# Patient Record
Sex: Male | Born: 1955 | Race: White | Hispanic: Yes | Marital: Single | Smoking: Never smoker
Health system: Southern US, Community
[De-identification: ages and names within clinical notes are randomized; demographics above are authoritative.]

## PROBLEM LIST (undated history)

## (undated) DIAGNOSIS — Z923 Personal history of irradiation: Secondary | ICD-10-CM

## (undated) DIAGNOSIS — I4891 Unspecified atrial fibrillation: Secondary | ICD-10-CM

## (undated) DIAGNOSIS — F1010 Alcohol abuse, uncomplicated: Secondary | ICD-10-CM

## (undated) DIAGNOSIS — F319 Bipolar disorder, unspecified: Secondary | ICD-10-CM

## (undated) DIAGNOSIS — A529 Late syphilis, unspecified: Secondary | ICD-10-CM

## (undated) DIAGNOSIS — G459 Transient cerebral ischemic attack, unspecified: Secondary | ICD-10-CM

## (undated) DIAGNOSIS — Z21 Asymptomatic human immunodeficiency virus [HIV] infection status: Secondary | ICD-10-CM

## (undated) DIAGNOSIS — N179 Acute kidney failure, unspecified: Secondary | ICD-10-CM

## (undated) DIAGNOSIS — E872 Acidosis: Secondary | ICD-10-CM

## (undated) DIAGNOSIS — M545 Low back pain: Secondary | ICD-10-CM

## (undated) DIAGNOSIS — F329 Major depressive disorder, single episode, unspecified: Secondary | ICD-10-CM

## (undated) DIAGNOSIS — Z9289 Personal history of other medical treatment: Secondary | ICD-10-CM

## (undated) DIAGNOSIS — F332 Major depressive disorder, recurrent severe without psychotic features: Secondary | ICD-10-CM

## (undated) DIAGNOSIS — I10 Essential (primary) hypertension: Secondary | ICD-10-CM

## (undated) DIAGNOSIS — I639 Cerebral infarction, unspecified: Secondary | ICD-10-CM

## (undated) DIAGNOSIS — I951 Orthostatic hypotension: Secondary | ICD-10-CM

## (undated) DIAGNOSIS — B20 Human immunodeficiency virus [HIV] disease: Secondary | ICD-10-CM

## (undated) DIAGNOSIS — K912 Postsurgical malabsorption, not elsewhere classified: Secondary | ICD-10-CM

## (undated) DIAGNOSIS — E785 Hyperlipidemia, unspecified: Secondary | ICD-10-CM

## (undated) DIAGNOSIS — F32A Depression, unspecified: Secondary | ICD-10-CM

## (undated) HISTORY — DX: Personal history of other medical treatment: Z92.89

## (undated) HISTORY — DX: Depression, unspecified: F32.A

## (undated) HISTORY — DX: Major depressive disorder, single episode, unspecified: F32.9

## (undated) HISTORY — DX: Orthostatic hypotension: I95.1

## (undated) HISTORY — DX: Transient cerebral ischemic attack, unspecified: G45.9

## (undated) HISTORY — DX: Essential (primary) hypertension: I10

## (undated) HISTORY — DX: Alcohol abuse, uncomplicated: F10.10

## (undated) HISTORY — DX: Low back pain: M54.5

## (undated) HISTORY — DX: Bipolar disorder, unspecified: F31.9

## (undated) HISTORY — DX: Late syphilis, unspecified: A52.9

## (undated) HISTORY — DX: Personal history of irradiation: Z92.3

## (undated) HISTORY — DX: Major depressive disorder, recurrent severe without psychotic features: F33.2

## (undated) HISTORY — DX: Postsurgical malabsorption, not elsewhere classified: K91.2

## (undated) HISTORY — DX: Unspecified atrial fibrillation: I48.91

---

## 1998-12-12 DIAGNOSIS — Z9289 Personal history of other medical treatment: Secondary | ICD-10-CM

## 1998-12-12 HISTORY — PX: OTHER SURGICAL HISTORY: SHX169

## 1998-12-12 HISTORY — DX: Personal history of other medical treatment: Z92.89

## 2010-12-12 HISTORY — PX: GASTRIC BYPASS: SHX52

## 2012-05-15 DIAGNOSIS — Z9889 Other specified postprocedural states: Secondary | ICD-10-CM | POA: Insufficient documentation

## 2012-05-15 DIAGNOSIS — R918 Other nonspecific abnormal finding of lung field: Secondary | ICD-10-CM | POA: Insufficient documentation

## 2012-05-15 DIAGNOSIS — R7301 Impaired fasting glucose: Secondary | ICD-10-CM | POA: Insufficient documentation

## 2012-05-15 DIAGNOSIS — F319 Bipolar disorder, unspecified: Secondary | ICD-10-CM | POA: Insufficient documentation

## 2012-05-15 DIAGNOSIS — N529 Male erectile dysfunction, unspecified: Secondary | ICD-10-CM | POA: Insufficient documentation

## 2014-10-22 ENCOUNTER — Telehealth: Payer: Self-pay

## 2014-10-22 NOTE — Telephone Encounter (Signed)
New referral from [facility].  Patient is extremely anxious about new diagnosis. If possible please schedule ASAP visit.  I spoke with Baird LyonsCasey and I will work patient into schedule while he is here on Tuesday meeting with [facility]. Baird LyonsCasey will notify patient.    Laurell Josephsammy K King, RN

## 2014-10-30 ENCOUNTER — Ambulatory Visit (INDEPENDENT_AMBULATORY_CARE_PROVIDER_SITE_OTHER): Payer: Self-pay

## 2014-10-30 DIAGNOSIS — Z23 Encounter for immunization: Secondary | ICD-10-CM

## 2014-10-30 DIAGNOSIS — Z21 Asymptomatic human immunodeficiency virus [HIV] infection status: Secondary | ICD-10-CM

## 2014-10-30 DIAGNOSIS — Z113 Encounter for screening for infections with a predominantly sexual mode of transmission: Secondary | ICD-10-CM

## 2014-10-30 DIAGNOSIS — Z79899 Other long term (current) drug therapy: Secondary | ICD-10-CM

## 2014-10-30 DIAGNOSIS — A539 Syphilis, unspecified: Secondary | ICD-10-CM

## 2014-10-30 DIAGNOSIS — B20 Human immunodeficiency virus [HIV] disease: Secondary | ICD-10-CM

## 2014-10-30 LAB — COMPLETE METABOLIC PANEL WITH GFR
ALK PHOS: 69 U/L (ref 39–117)
ALT: 9 U/L (ref 0–53)
AST: 20 U/L (ref 0–37)
Albumin: 3.7 g/dL (ref 3.5–5.2)
BILIRUBIN TOTAL: 0.3 mg/dL (ref 0.2–1.2)
BUN: 7 mg/dL (ref 6–23)
CO2: 24 mEq/L (ref 19–32)
CREATININE: 0.81 mg/dL (ref 0.50–1.35)
Calcium: 8.7 mg/dL (ref 8.4–10.5)
Chloride: 103 mEq/L (ref 96–112)
GFR, Est African American: >89 mL/min
GFR, Est Non African American: >89 mL/min
Glucose, Bld: 75 mg/dL (ref 70–99)
Potassium: 4.5 mEq/L (ref 3.5–5.3)
SODIUM: 140 meq/L (ref 135–145)
TOTAL PROTEIN: 6.5 g/dL (ref 6.0–8.3)

## 2014-10-30 LAB — LIPID PANEL
CHOLESTEROL: 137 mg/dL (ref 0–200)
HDL: 40 mg/dL (ref >39)
LDL Cholesterol: 78 mg/dL (ref 0–99)
Total CHOL/HDL Ratio: 3.4 Ratio
Triglycerides: 94 mg/dL (ref <150)
VLDL: 19 mg/dL (ref 0–40)

## 2014-10-30 LAB — CBC WITH DIFFERENTIAL/PLATELET
BASOS PCT: 1 % (ref 0–1)
Basophils Absolute: 0.1 10*3/uL (ref 0.0–0.1)
EOS ABS: 0.1 10*3/uL (ref 0.0–0.7)
Eosinophils Relative: 1 % (ref 0–5)
HCT: 37 % — ABNORMAL LOW (ref 39.0–52.0)
HEMOGLOBIN: 11.9 g/dL — AB (ref 13.0–17.0)
Lymphocytes Relative: 45 % (ref 12–46)
Lymphs Abs: 2.4 10*3/uL (ref 0.7–4.0)
MCH: 27.7 pg (ref 26.0–34.0)
MCHC: 32.2 g/dL (ref 30.0–36.0)
MCV: 86 fL (ref 78.0–100.0)
MPV: 9.5 fL (ref 9.4–12.4)
Monocytes Absolute: 0.6 10*3/uL (ref 0.1–1.0)
Monocytes Relative: 11 % (ref 3–12)
Neutro Abs: 2.2 10*3/uL (ref 1.7–7.7)
Neutrophils Relative %: 42 % — ABNORMAL LOW (ref 43–77)
Platelets: 327 10*3/uL (ref 150–400)
RBC: 4.3 MIL/uL (ref 4.22–5.81)
RDW: 15.8 % — AB (ref 11.5–15.5)
WBC: 5.3 10*3/uL (ref 4.0–10.5)

## 2014-10-31 LAB — HEPATITIS B SURFACE ANTIBODY,QUALITATIVE: HEP B S AB: POSITIVE — AB

## 2014-10-31 LAB — HEPATITIS C ANTIBODY: HCV Ab: NEGATIVE

## 2014-10-31 LAB — HEPATITIS B SURFACE ANTIGEN: HEP B S AG: NEGATIVE

## 2014-10-31 LAB — FLUORESCENT TREPONEMAL AB(FTA)-IGG-BLD: FLUORESCENT TREPONEMAL ABS: REACTIVE — AB

## 2014-10-31 LAB — GC/CHLAMYDIA PROBE AMP
CT Probe RNA: NEGATIVE
GC Probe RNA: NEGATIVE

## 2014-10-31 LAB — RPR TITER

## 2014-10-31 LAB — T-HELPER CELL (CD4) - (RCID CLINIC ONLY)
CD4 T CELL HELPER: 25 % — AB (ref 33–55)
CD4 T Cell Abs: 710 /uL (ref 400–2700)

## 2014-10-31 LAB — HIV-1 RNA ULTRAQUANT REFLEX TO GENTYP+
HIV 1 RNA QUANT: 260936 {copies}/mL — AB (ref <20)
HIV-1 RNA Quant, Log: 5.42 {Log} — ABNORMAL HIGH (ref <1.30)

## 2014-10-31 LAB — HEPATITIS A ANTIBODY, TOTAL: HEP A TOTAL AB: NONREACTIVE

## 2014-10-31 LAB — RPR: RPR Ser Ql: REACTIVE — AB

## 2014-10-31 LAB — HEPATITIS B CORE ANTIBODY, TOTAL: HEP B C TOTAL AB: NONREACTIVE

## 2014-11-02 LAB — QUANTIFERON TB GOLD ASSAY (BLOOD)
Interferon Gamma Release Assay: NEGATIVE
Mitogen value: 2.23 IU/mL
QUANTIFERON NIL VALUE: 0.13 [IU]/mL
Quantiferon Tb Ag Minus Nil Value: 0.04 IU/mL
TB Ag value: 0.17 IU/mL

## 2014-11-03 ENCOUNTER — Telehealth: Payer: Self-pay

## 2014-11-03 NOTE — Progress Notes (Signed)
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   He is visibly upset and crying upon starting intake.  He was in what he thought was a monogamous relationship for at least two years.  He and his male partner have split after patient received his HIV results.   He developed a chancre on his penis and went to health department for testing. He was treated with Bicillin 2.4 mil once and reports chancres on hand have increased and are still present today.   He has swollen neck gland and a mild sore throat. 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   I will swab his throat today for GC/Chlymidia .   Laurell Josephsammy K Clance Baquero, RN    11-03-14   Syphilis titer returned :  1 :64.  Previous titer with health department 1:16  Per Dr Daiva EvesVan Dam patient will need 3 weeks of treatment with Bicillin LA .  I will call patient to start treatment.   Laurell Josephsammy K Saanvika Vazques, RN

## 2014-11-03 NOTE — Telephone Encounter (Signed)
Patient informed he will need Bicillin 2.4 million X 3 weeks for elevated RPR titer per Dr Daiva EvesVan Dam . Initial titer with health department was 1:16 on 10-13-14.   Not sure why titer would be elevated since he received Bicillin injection with health department.  Patient did have visible chancres on hands while at intake visit on 10-30-14 and reported having one on his penis at initial testing.    Laurell Josephsammy K Rebbecca Osuna, RN   He will start injections on 11-05-14.   Laurell Josephsammy K Briget Shaheed, RN

## 2014-11-03 NOTE — Telephone Encounter (Signed)
If he had chancre on penis then this is probably overkill due to lab variation. I was worried he could have disseminated later disease if he hadnt had the chancres

## 2014-11-05 ENCOUNTER — Ambulatory Visit: Payer: Self-pay

## 2014-11-05 ENCOUNTER — Ambulatory Visit (INDEPENDENT_AMBULATORY_CARE_PROVIDER_SITE_OTHER): Payer: Self-pay | Admitting: *Deleted

## 2014-11-05 DIAGNOSIS — A539 Syphilis, unspecified: Secondary | ICD-10-CM

## 2014-11-05 LAB — HLA B*5701: HLA-B*5701 w/rflx HLA-B High: NEGATIVE

## 2014-11-05 MED ORDER — PENICILLIN G BENZATHINE 1200000 UNIT/2ML IM SUSP
1.2000 10*6.[IU] | Freq: Once | INTRAMUSCULAR | Status: AC
  Administered 2014-11-05: 1.2 10*6.[IU] via INTRAMUSCULAR

## 2014-11-11 ENCOUNTER — Ambulatory Visit: Payer: Self-pay

## 2014-11-12 ENCOUNTER — Ambulatory Visit (INDEPENDENT_AMBULATORY_CARE_PROVIDER_SITE_OTHER): Payer: Self-pay | Admitting: Infectious Disease

## 2014-11-12 ENCOUNTER — Encounter: Payer: Self-pay | Admitting: Infectious Disease

## 2014-11-12 VITALS — BP 147/88 | HR 69 | Temp 98.8°F | Wt 192.5 lb

## 2014-11-12 DIAGNOSIS — B20 Human immunodeficiency virus [HIV] disease: Secondary | ICD-10-CM

## 2014-11-12 DIAGNOSIS — A539 Syphilis, unspecified: Secondary | ICD-10-CM

## 2014-11-12 DIAGNOSIS — F3170 Bipolar disorder, currently in remission, most recent episode unspecified: Secondary | ICD-10-CM

## 2014-11-12 DIAGNOSIS — Z23 Encounter for immunization: Secondary | ICD-10-CM

## 2014-11-12 MED ORDER — PENICILLIN G BENZATHINE 1200000 UNIT/2ML IM SUSP
1.2000 10*6.[IU] | Freq: Once | INTRAMUSCULAR | Status: AC
  Administered 2014-11-12: 1.2 10*6.[IU] via INTRAMUSCULAR

## 2014-11-12 NOTE — Progress Notes (Signed)
Subjective:    Patient ID: Arthur [name], male    DOB: October 23, 1956, 58 y.o.   MRN: [id number]  HPI  58 year old with newly diagnosed HIV. He has history of bipolar disorder. He states that he tested negative last SPring with PCP. He estimates he contracted the HIV from unprotected sex with another man in the past several months. His CD4 is healthy above 700. His VL is nearly 260K. HIV genotype is pending.   He is very much interested in ACTG study involving Dolutegravir and Lamivudine (dual therapy) provided he is without exclusion criteria.  I explained rationale for the study and also typical actions we would take with conventional therapy. I am confident that he will do exceptionally well in the study.  He does also have syphilis with titer higher here than [facility].  Review of Systems  Constitutional: Negative for fever, chills, diaphoresis, activity change, appetite change, fatigue and unexpected weight change.  HENT: Negative for congestion, rhinorrhea, sinus pressure, sneezing, sore throat and trouble swallowing.   Eyes: Negative for photophobia and visual disturbance.  Respiratory: Negative for cough, chest tightness, shortness of breath, wheezing and stridor.   Cardiovascular: Negative for chest pain, palpitations and leg swelling.  Gastrointestinal: Negative for nausea, vomiting, abdominal pain, diarrhea, constipation, blood in stool, abdominal distention and anal bleeding.  Genitourinary: Negative for dysuria, hematuria, flank pain and difficulty urinating.  Musculoskeletal: Negative for myalgias, back pain, joint swelling, arthralgias and gait problem.  Skin: Negative for color change, pallor, rash and wound.  Neurological: Negative for dizziness, tremors, weakness and light-headedness.  Hematological: Negative for adenopathy. Does not bruise/bleed easily.  Psychiatric/Behavioral: Negative for behavioral problems, confusion, sleep disturbance, dysphoric mood, decreased  concentration and agitation.       Objective:   Physical Exam  Constitutional: He is oriented to person, place, and time. He appears well-developed and well-nourished. No distress.  HENT:  Head: Normocephalic and atraumatic.  Mouth/Throat: Oropharynx is clear and moist. No oropharyngeal exudate.  Eyes: Conjunctivae and EOM are normal. Pupils are equal, round, and reactive to light. No scleral icterus.  Neck: Normal range of motion. Neck supple. No JVD present.  Cardiovascular: Normal rate, regular rhythm and normal heart sounds.  Exam reveals no gallop and no friction rub.   No murmur heard. Pulmonary/Chest: Effort normal and breath sounds normal. No respiratory distress. He has no wheezes. He has no rales. He exhibits no tenderness.  Abdominal: He exhibits no distension and no mass. There is no tenderness. There is no rebound and no guarding.  Musculoskeletal: He exhibits no edema or tenderness.  Lymphadenopathy:    He has no cervical adenopathy.  Neurological: He is alert and oriented to person, place, and time. He has normal reflexes. He exhibits normal muscle tone. Coordination normal.  Skin: Skin is warm and dry. He is not diaphoretic. No erythema. No pallor.  Psychiatric: He has a normal mood and affect. His behavior is normal. Judgment and thought content normal.          Assessment & Plan:    HIV: enroll into ACTG study as long as no exclusion criteria met (only ones I can think of coming up would be if he had transmitted R) I spent greater than 40 minutes with the patient including greater than 50% of time in face to face counsel of the patient and in coordination of their care.  Syphilis: give IM PCN x 3 total  Need for hep A vaccine: give hep A vax #1  Bipolar disorder: should he go back on meds make sure he does not get tegretol or dilantin which have significant drug drug interactions.

## 2014-11-13 ENCOUNTER — Ambulatory Visit: Payer: Self-pay | Admitting: Infectious Disease

## 2014-11-13 ENCOUNTER — Ambulatory Visit: Payer: Self-pay

## 2014-11-14 LAB — HIV-1 GENOTYPR PLUS

## 2014-11-17 ENCOUNTER — Ambulatory Visit (INDEPENDENT_AMBULATORY_CARE_PROVIDER_SITE_OTHER): Payer: Self-pay | Admitting: *Deleted

## 2014-11-17 VITALS — BP 133/80 | HR 58 | Temp 98.4°F | Resp 16 | Ht 69.5 in | Wt 195.5 lb

## 2014-11-17 DIAGNOSIS — Z006 Encounter for examination for normal comparison and control in clinical research program: Secondary | ICD-10-CM

## 2014-11-17 DIAGNOSIS — B20 Human immunodeficiency virus [HIV] disease: Secondary | ICD-10-CM

## 2014-11-17 LAB — CBC WITH DIFFERENTIAL/PLATELET
Basophils Absolute: 0.1 10*3/uL (ref 0.0–0.1)
Basophils Relative: 1 % (ref 0–1)
Eosinophils Absolute: 0.1 10*3/uL (ref 0.0–0.7)
Eosinophils Relative: 2 % (ref 0–5)
HCT: 36.4 % — ABNORMAL LOW (ref 39.0–52.0)
HEMOGLOBIN: 12.2 g/dL — AB (ref 13.0–17.0)
LYMPHS ABS: 2.5 10*3/uL (ref 0.7–4.0)
LYMPHS PCT: 39 % (ref 12–46)
MCH: 29 pg (ref 26.0–34.0)
MCHC: 33.5 g/dL (ref 30.0–36.0)
MCV: 86.5 fL (ref 78.0–100.0)
MPV: 9.5 fL (ref 9.4–12.4)
Monocytes Absolute: 0.7 10*3/uL (ref 0.1–1.0)
Monocytes Relative: 11 % (ref 3–12)
NEUTROS PCT: 47 % (ref 43–77)
Neutro Abs: 3 10*3/uL (ref 1.7–7.7)
PLATELETS: 244 10*3/uL (ref 150–400)
RBC: 4.21 MIL/uL — AB (ref 4.22–5.81)
RDW: 18.2 % — ABNORMAL HIGH (ref 11.5–15.5)
WBC: 6.3 10*3/uL (ref 4.0–10.5)

## 2014-11-17 LAB — COMPREHENSIVE METABOLIC PANEL
ALBUMIN: 3.9 g/dL (ref 3.5–5.2)
ALT: 10 U/L (ref 0–53)
AST: 22 U/L (ref 0–37)
Alkaline Phosphatase: 64 U/L (ref 39–117)
BUN: 9 mg/dL (ref 6–23)
CALCIUM: 8.9 mg/dL (ref 8.4–10.5)
CHLORIDE: 101 meq/L (ref 96–112)
CO2: 26 meq/L (ref 19–32)
CREATININE: 0.79 mg/dL (ref 0.50–1.35)
Glucose, Bld: 82 mg/dL (ref 70–99)
POTASSIUM: 4.3 meq/L (ref 3.5–5.3)
Sodium: 135 mEq/L (ref 135–145)
TOTAL PROTEIN: 6.7 g/dL (ref 6.0–8.3)
Total Bilirubin: 0.6 mg/dL (ref 0.2–1.2)

## 2014-11-17 LAB — BILIRUBIN,DIRECT & INDIRECT (FRACTIONATED)
Bilirubin, Direct: 0.1 mg/dL (ref 0.0–0.3)
Indirect Bilirubin: 0.5 mg/dL (ref 0.2–1.2)

## 2014-11-17 NOTE — Progress Notes (Signed)
Arthur [name] is here to screen for the ACTG study [id number] [id number]. Informed consent was obtained after he had an opportunity to read over the consent and have all his questions answered. He verbalized understanding that the study is to see how well the 2 drug regimen of lamivudine ad tivicay works in people who have never been on HIV treatment. He reports some nausea occasionally that he attributes to the virus, but otherwise has been fine. He is undergoing syphilis treatment and has the last set of shots on Wednesday. We will send off a blood sample to check for integrase resistance tomorrow and if that is  Normal will enroll him on study asap.

## 2014-11-19 ENCOUNTER — Ambulatory Visit (INDEPENDENT_AMBULATORY_CARE_PROVIDER_SITE_OTHER): Payer: Self-pay | Admitting: *Deleted

## 2014-11-19 DIAGNOSIS — A539 Syphilis, unspecified: Secondary | ICD-10-CM

## 2014-11-19 MED ORDER — PENICILLIN G BENZATHINE 1200000 UNIT/2ML IM SUSP
1.2000 10*6.[IU] | Freq: Once | INTRAMUSCULAR | Status: AC
  Administered 2014-11-19: 1.2 10*6.[IU] via INTRAMUSCULAR

## 2014-11-20 ENCOUNTER — Ambulatory Visit: Payer: Self-pay

## 2014-11-20 ENCOUNTER — Other Ambulatory Visit: Payer: Self-pay | Admitting: *Deleted

## 2014-11-20 DIAGNOSIS — B20 Human immunodeficiency virus [HIV] disease: Secondary | ICD-10-CM

## 2014-11-20 MED ORDER — EFAVIRENZ-EMTRICITAB-TENOFOVIR 600-200-300 MG PO TABS: 1.0000 | ORAL_TABLET | Freq: Every day | ORAL | Status: DC

## 2014-11-25 ENCOUNTER — Ambulatory Visit: Payer: Self-pay

## 2014-11-27 ENCOUNTER — Ambulatory Visit (INDEPENDENT_AMBULATORY_CARE_PROVIDER_SITE_OTHER): Payer: Self-pay | Admitting: *Deleted

## 2014-11-27 VITALS — BP 149/96 | HR 60 | Temp 98.2°F | Resp 16 | Wt 196.0 lb

## 2014-11-27 DIAGNOSIS — Z21 Asymptomatic human immunodeficiency virus [HIV] infection status: Secondary | ICD-10-CM

## 2014-11-27 DIAGNOSIS — F32A Depression, unspecified: Secondary | ICD-10-CM | POA: Insufficient documentation

## 2014-11-27 DIAGNOSIS — F329 Major depressive disorder, single episode, unspecified: Secondary | ICD-10-CM | POA: Insufficient documentation

## 2014-11-27 DIAGNOSIS — Z006 Encounter for examination for normal comparison and control in clinical research program: Secondary | ICD-10-CM

## 2014-11-27 LAB — COMPREHENSIVE METABOLIC PANEL
ALBUMIN: 3.9 g/dL (ref 3.5–5.2)
ALT: 11 U/L (ref 0–53)
AST: 35 U/L (ref 0–37)
Alkaline Phosphatase: 72 U/L (ref 39–117)
BUN: 6 mg/dL (ref 6–23)
CALCIUM: 8.9 mg/dL (ref 8.4–10.5)
CHLORIDE: 102 meq/L (ref 96–112)
CO2: 25 mEq/L (ref 19–32)
Creat: 0.81 mg/dL (ref 0.50–1.35)
Glucose, Bld: 92 mg/dL (ref 70–99)
Potassium: 4.4 mEq/L (ref 3.5–5.3)
Sodium: 136 mEq/L (ref 135–145)
Total Bilirubin: 0.8 mg/dL (ref 0.2–1.2)
Total Protein: 6.9 g/dL (ref 6.0–8.3)

## 2014-11-27 LAB — BILIRUBIN,DIRECT & INDIRECT (FRACTIONATED)
BILIRUBIN DIRECT: 0.2 mg/dL (ref 0.0–0.3)
Indirect Bilirubin: 0.6 mg/dL (ref 0.2–1.2)

## 2014-11-27 LAB — LIPID PANEL
CHOL/HDL RATIO: 2.5 ratio
CHOLESTEROL: 150 mg/dL (ref 0–200)
HDL: 61 mg/dL (ref >39)
LDL Cholesterol: 73 mg/dL (ref 0–99)
Triglycerides: 79 mg/dL (ref <150)
VLDL: 16 mg/dL (ref 0–40)

## 2014-11-27 LAB — HEPATITIS C ANTIBODY: HCV Ab: NEGATIVE

## 2014-11-27 MED ORDER — DOLUTEGRAVIR SODIUM 50 MG PO TABS: 50.0000 mg | ORAL_TABLET | Freq: Every day | ORAL | Status: DC

## 2014-11-27 MED ORDER — LAMIVUDINE 300 MG PO TABS: 300.0000 mg | ORAL_TABLET | Freq: Every day | ORAL | Status: DC

## 2014-11-27 NOTE — Progress Notes (Signed)
Arthur [name] was enrolled on the [id number] study today after eligibility was verified. He denies any new problems or concerns, very eager to start meds. He says his nausea has eased off. He was randomized to lamivudine 300mg  and tivicay 50mg  daily and he plans to start them today. We discussed the importance of adherence, dosing, side effects and when to call for problems. The pharmacist also discussed this with him. I told him to be especially alert to signs of allergy such as rash, high fever, swelling, etc. I gave him a 7 day drug box to help with adherence.  I will call him in 1 week and he returns for study in 2 weeks.

## 2014-11-27 NOTE — Progress Notes (Signed)
   Subjective:    Patient ID: Arthur [name], male    DOB: [birth year], 58 y.o.   MRN: [id number]  HPI    Review of Systems  Constitutional: Negative.   HENT: Negative.   Eyes: Negative.   Respiratory: Negative.   Cardiovascular: Negative.   Gastrointestinal: Negative.   Genitourinary: Negative.   Musculoskeletal: Negative.   Skin: Negative.   Neurological: Negative.   Psychiatric/Behavioral: Negative.        Objective:   Physical Exam  Constitutional: He is oriented to person, place, and time.  HENT:  Mouth/Throat: Mucous membranes are normal. Posterior oropharyngeal erythema present.  Eyes: No scleral icterus.  Neck: Normal range of motion.  Cardiovascular: Normal rate, regular rhythm, normal heart sounds and intact distal pulses.   Pulmonary/Chest: Effort normal and breath sounds normal.  Abdominal: Soft. Bowel sounds are normal.  Musculoskeletal: Normal range of motion. He exhibits no edema.  Lymphadenopathy:    He has no cervical adenopathy.  Neurological: He is alert and oriented to person, place, and time.  Skin: Skin is warm and dry.  Psychiatric: He has a normal mood and affect.          Assessment & Plan:

## 2014-12-02 ENCOUNTER — Encounter: Payer: Self-pay | Admitting: Infectious Disease

## 2014-12-10 ENCOUNTER — Ambulatory Visit (INDEPENDENT_AMBULATORY_CARE_PROVIDER_SITE_OTHER): Payer: Self-pay | Admitting: *Deleted

## 2014-12-10 VITALS — BP 111/76 | HR 71 | Temp 98.2°F | Resp 16 | Wt 192.5 lb

## 2014-12-10 DIAGNOSIS — Z21 Asymptomatic human immunodeficiency virus [HIV] infection status: Secondary | ICD-10-CM

## 2014-12-10 DIAGNOSIS — F32A Depression, unspecified: Secondary | ICD-10-CM

## 2014-12-10 DIAGNOSIS — F329 Major depressive disorder, single episode, unspecified: Secondary | ICD-10-CM

## 2014-12-10 DIAGNOSIS — B20 Human immunodeficiency virus [HIV] disease: Secondary | ICD-10-CM

## 2014-12-10 MED ORDER — BUPROPION HCL 75 MG PO TABS: 75.0000 mg | ORAL_TABLET | Freq: Two times a day (BID) | ORAL | Status: DC

## 2014-12-10 NOTE — Progress Notes (Signed)
Arthur [name] is here for his 2 week study visit. He has been taking his ARVs everyday in the am. He has noticed the past week increase in nausea, some diarrhea, just feels bad, sleeping a lot. After further discussion he atrributes this to feeling depressed not the meds. He does have a history of bipolar depression and he has taken wellbutrin in the past. I spoke with Dr. Drue SecondSnider who prescribed wellbutrin 75mg  bid, 1 pill a day the first 5 days or so until he is used to it. She also wanted him to have counseling. He said he already [facility] and had appointments with Jodie that had gotten canceled. Checked with [employer] and his approval is still pending, so the meds will be put on hold until that goes through. [facility] and will check back next week so we can go ahead and get the antidepressant on board. I spoke with Arthur [name] on the phone about this and will call him next week. [facility] [place] [facility] [employer] [facility] [place] and he says he likes it there and the companionship from the group. He will return for study in 2 weeks and to see Dr. Daiva EvesVan Dam in 4 weeks.

## 2014-12-23 ENCOUNTER — Ambulatory Visit: Payer: Self-pay

## 2014-12-23 DIAGNOSIS — F1010 Alcohol abuse, uncomplicated: Secondary | ICD-10-CM

## 2014-12-24 ENCOUNTER — Ambulatory Visit (INDEPENDENT_AMBULATORY_CARE_PROVIDER_SITE_OTHER): Payer: Self-pay | Admitting: *Deleted

## 2014-12-24 VITALS — BP 133/87 | HR 60 | Temp 98.3°F | Resp 16 | Wt 198.5 lb

## 2014-12-24 DIAGNOSIS — Z006 Encounter for examination for normal comparison and control in clinical research program: Secondary | ICD-10-CM

## 2014-12-24 LAB — COMPREHENSIVE METABOLIC PANEL
ALBUMIN: 3.8 g/dL (ref 3.5–5.2)
ALT: 13 U/L (ref 0–53)
AST: 25 U/L (ref 0–37)
Alkaline Phosphatase: 48 U/L (ref 39–117)
BUN: 8 mg/dL (ref 6–23)
CALCIUM: 9 mg/dL (ref 8.4–10.5)
CO2: 24 meq/L (ref 19–32)
CREATININE: 0.79 mg/dL (ref 0.50–1.35)
Chloride: 100 mEq/L (ref 96–112)
GLUCOSE: 87 mg/dL (ref 70–99)
Potassium: 4.5 mEq/L (ref 3.5–5.3)
Sodium: 133 mEq/L — ABNORMAL LOW (ref 135–145)
Total Bilirubin: 0.6 mg/dL (ref 0.2–1.2)
Total Protein: 6.9 g/dL (ref 6.0–8.3)

## 2014-12-24 LAB — BILIRUBIN,DIRECT & INDIRECT (FRACTIONATED)
BILIRUBIN DIRECT: 0.2 mg/dL (ref 0.0–0.3)
BILIRUBIN INDIRECT: 0.4 mg/dL (ref 0.2–1.2)

## 2014-12-24 NOTE — Progress Notes (Signed)
Arthur [name] is here today for his 4 week study followup. He says he feels much better, less depressed since starting wellbutrin. His nausea and feeling bad have resolved, still some mild diarrhea. He will return in 4 weeks for the next study visit.

## 2014-12-30 ENCOUNTER — Ambulatory Visit: Payer: Self-pay | Admitting: *Deleted

## 2014-12-30 DIAGNOSIS — F1010 Alcohol abuse, uncomplicated: Secondary | ICD-10-CM

## 2015-01-06 ENCOUNTER — Ambulatory Visit (INDEPENDENT_AMBULATORY_CARE_PROVIDER_SITE_OTHER): Payer: Self-pay | Admitting: Infectious Disease

## 2015-01-06 ENCOUNTER — Encounter: Payer: Self-pay | Admitting: Infectious Disease

## 2015-01-06 VITALS — BP 133/92 | HR 89 | Temp 98.3°F | Ht 69.0 in | Wt 197.0 lb

## 2015-01-06 DIAGNOSIS — A529 Late syphilis, unspecified: Secondary | ICD-10-CM | POA: Insufficient documentation

## 2015-01-06 DIAGNOSIS — B20 Human immunodeficiency virus [HIV] disease: Secondary | ICD-10-CM

## 2015-01-06 DIAGNOSIS — F319 Bipolar disorder, unspecified: Secondary | ICD-10-CM

## 2015-01-06 MED ORDER — BUPROPION HCL 100 MG PO TABS: 100.0000 mg | ORAL_TABLET | Freq: Three times a day (TID) | ORAL | Status: DC

## 2015-01-06 NOTE — Progress Notes (Signed)
Subjective:    Patient ID: Arthur [name], male    DOB: [birth year], 59 y.o.   MRN: [id number]  HPI   59 year old with newly diagnosed HIV. He has history of bipolar disorder. He states that he tested negative last SPring with PCP. He estimates he contracted the HIV from unprotected sex with another man in the past several months. His CD4 is healthy above 700. His VL was nearly 260K. HIV genotype showed no resistance and he enrolled into ACTG study and has been on  Dolutegravir and Lamivudine (dual therapy) .  He dropped his VL to 398 within just 2 weeks of starting meds and CD4 is above 700  He has had minimum loose stools and no other adverse side effects.   The wellbutrin is helping his mood but he wonders if he he could have the dose increased. I explained rationale for the study and also typical actions we would take with conventional therapy. I am confident that he will do exceptionally well in the study.  He does also have syphilis with titer higher here than [facility].  Review of Systems  Constitutional: Negative for fever, chills, diaphoresis, activity change, appetite change, fatigue and unexpected weight change.  HENT: Negative for congestion, rhinorrhea, sinus pressure, sneezing, sore throat and trouble swallowing.   Eyes: Negative for photophobia and visual disturbance.  Respiratory: Negative for cough, chest tightness, shortness of breath, wheezing and stridor.   Cardiovascular: Negative for chest pain, palpitations and leg swelling.  Gastrointestinal: Negative for nausea, vomiting, abdominal pain, diarrhea, constipation, blood in stool, abdominal distention and anal bleeding.  Genitourinary: Negative for dysuria, hematuria, flank pain and difficulty urinating.  Musculoskeletal: Negative for myalgias, back pain, joint swelling, arthralgias and gait problem.  Skin: Negative for color change, pallor, rash and wound.  Neurological: Negative for dizziness, tremors, weakness and  light-headedness.  Hematological: Negative for adenopathy. Does not bruise/bleed easily.  Psychiatric/Behavioral: Negative for behavioral problems, confusion, sleep disturbance, dysphoric mood, decreased concentration and agitation.       Objective:   Physical Exam  Constitutional: He is oriented to person, place, and time. He appears well-developed and well-nourished. No distress.  HENT:  Head: Normocephalic and atraumatic.  Mouth/Throat: Oropharynx is clear and moist. No oropharyngeal exudate.  Eyes: Conjunctivae and EOM are normal. Pupils are equal, round, and reactive to light. No scleral icterus.  Neck: Normal range of motion. Neck supple. No JVD present.  Cardiovascular: Normal rate, regular rhythm and normal heart sounds.  Exam reveals no gallop and no friction rub.   No murmur heard. Pulmonary/Chest: Effort normal and breath sounds normal. No respiratory distress. He has no wheezes. He has no rales. He exhibits no tenderness.  Abdominal: He exhibits no distension and no mass. There is no tenderness. There is no rebound and no guarding.  Musculoskeletal: He exhibits no edema or tenderness.  Lymphadenopathy:    He has no cervical adenopathy.  Neurological: He is alert and oriented to person, place, and time. He has normal reflexes. He exhibits normal muscle tone. Coordination normal.  Skin: Skin is warm and dry. He is not diaphoretic. No erythema. No pallor.  Psychiatric: He has a normal mood and affect. His behavior is normal. Judgment and thought content normal.          Assessment & Plan:    HIV disease: continue DTG, 3TC via ACTG I spent greater than 25 minutes with the patient including greater than 50% of time in face to face counsel of the  patient and in coordination of their care.  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

## 2015-01-14 ENCOUNTER — Encounter: Payer: Self-pay | Admitting: Infectious Disease

## 2015-01-14 LAB — HIV-1 RNA QUANT-NO REFLEX-BLD
HIV 1 RNA VIRAL LOAD: 398
HIV-1 RNA Viral Load: 192627
HIV-1 RNA Viral Load: 69

## 2015-01-14 LAB — CD4/CD8 (T-HELPER/T-SUPPRESSOR CELL)
CD4%: 28.1
CD4: 759
CD8 % Suppressor T Cell: 45.4
CD8: 1226

## 2015-01-21 ENCOUNTER — Ambulatory Visit (INDEPENDENT_AMBULATORY_CARE_PROVIDER_SITE_OTHER): Payer: Self-pay | Admitting: *Deleted

## 2015-01-21 ENCOUNTER — Ambulatory Visit: Payer: Self-pay | Admitting: *Deleted

## 2015-01-21 VITALS — BP 139/87 | HR 62 | Temp 98.2°F | Resp 16 | Wt 197.1 lb

## 2015-01-21 DIAGNOSIS — Z006 Encounter for examination for normal comparison and control in clinical research program: Secondary | ICD-10-CM

## 2015-01-21 DIAGNOSIS — F1010 Alcohol abuse, uncomplicated: Secondary | ICD-10-CM

## 2015-01-21 NOTE — Progress Notes (Signed)
Irish LackWesley Venezia returned to clinic for week 8 for study [id number].  He reports 100% compliance with his Dolutegravir and Lamivudine.  Denies any side effects from the medications.  Reports Wellbutrin dose increased at last MD visit, has noticed less fatigue.  Also going to support groups, and feels his depression has improved. Given refill on study meds.  To return in 1 month.

## 2015-01-22 NOTE — BH Specialist Note (Signed)
01-21-15 Arthur [name] Patient was present for his scheduled appointment today with counselor.  Patient was oriented times four with good affect and dress.  Patient was alert and talkative.  Patient reported that he has been clean 6 days. Patient stated that he was feeling great but had experienced a situation that tempted him to drink. Patient indicated that his partner of 6 years whom he broke up with last fall contacted him.  Patient shared that this stirred up a lot of emotions yet he was able to use his coping skills that he has been working on during therapy to resist taking the first drink.  Patient stated that he is going to church now and is seeking comfort from his higher power.  Patient indicated that the he is also continuing to attend the [facility]" support group here locally a few times a week.  Patient stated that he is working on temptation and triggers for his drinking.  Counselor educated patient on acceptance of his addiction and the power that drugs and alcohol can have over a person.  Counselor inform patient that substance use can start off as a casual thing but move into a more compulsive use.  Patient agreed that this indeed is what has happened to him.  Patient shared that he has only been out of control with his drinking for the past 3 years and feels that getting diagnosed with HIV really caused him to spiral downward. Patient indicated that he indeed took counselors suggestion of substance abuse outpatient treatment and is now attending [facility] treatment for his alcohol abuse.  Patient attends group two times a week.  Counselor provided support and encouragement to patient and gave him props for his effort and great attitude.   Jenel LucksJodi Leonardo Makris, LPCA, MA [facility]

## 2015-01-28 ENCOUNTER — Encounter: Payer: Self-pay | Admitting: Infectious Disease

## 2015-01-29 ENCOUNTER — Ambulatory Visit: Payer: Self-pay | Admitting: *Deleted

## 2015-01-29 DIAGNOSIS — F1010 Alcohol abuse, uncomplicated: Secondary | ICD-10-CM

## 2015-01-29 NOTE — BH Specialist Note (Signed)
Patient was present for his scheduled appointment today.  Patient was oriented times four with good affect and dress.  Patient was alert and talkative.  Patient communicated that he is now 14 days sober and has not had a drink.  Patient stated that he is feeling great and does not have any cravings.  Patient shared that that his biggest trigger was stress.  Patient indicated that he had met with his ex partner and had had dinner since our last session.  Counselor discussed with patient that recovery is an ongoing process and that it is a time for self care and self reflection while learning to control urges.  Counselor encouraged patient to keep his priorities of good health and sobriety at the forefront because balancing those alone is difficult but to add starting or resuming a romantic relationship can be dangerous and difficult to handle.  Counselor further shared that more importantly it can greatly affect the recovery process.Patient agreed and stated that he was going very slow.  Counselor discussed with patient to use caution where the romantic relationship was concerned.  Patient said that he was still attending his support groups and attending church on Sunday's.  Patient shared that he is officially going to start looking at applying to some part time jobs.  Patient did communicate that he was dreading having to tell potential employers about his past work place and that he had to leave due to his alcohol abuse.  Rolena Infante, LPCA, MA [facility]

## 2015-02-04 ENCOUNTER — Telehealth: Payer: Self-pay | Admitting: *Deleted

## 2015-02-04 ENCOUNTER — Ambulatory Visit: Payer: Self-pay | Admitting: *Deleted

## 2015-02-04 DIAGNOSIS — F1010 Alcohol abuse, uncomplicated: Secondary | ICD-10-CM

## 2015-02-04 NOTE — BH Specialist Note (Signed)
Patient was present today for his scheduled appointment with counselor.  Patient was oriented times four with good affect and dress.  Patient was alert and talkative.  Patient shared immediately that he was very happy about  Getting help with housing [facility].   Patient indicted that he could be in a place in about two weeks.  Patient stated that living in his car off and on for the last few months had been tough but a learning process as well.  Patient opened up about his drinking history.  Patient said that he was 20 days clean today and felt he had come so far.  Patient communicated that he had had some terrible experiences in the past drinking too.  Patient said that his excessive drinking had led to promiscuous behavior as well as some dangerous situations because of the blackout.  Patient further shared that he fell off of a bar stool about three years ago and hit his head requiring 11 stiches.  Patient discussed the fact that he is not having cravings any longer and that that scares him.  Counselor talked with patient on relapse prevention and mindfulness.  Counselor educated patient on internal as well as external triggers.  Counselor talked with patient on emotional upheaval and how meditation can be most beneficial.  Counselor noticed patient writing in a notebook.  Counselor expressed to patient that journaling can be a great tool to use while in recovery.  Counselor provided support and encouragement with patient.  Jenel LucksJodi Demetra Moya, LPCA, MA [facility]

## 2015-02-04 NOTE — Telephone Encounter (Signed)
Was given copies of bills from [facility].  Arthur [name] was covered by [employer] for all dates of service.  Adjusted

## 2015-02-05 ENCOUNTER — Ambulatory Visit: Payer: Self-pay

## 2015-02-09 ENCOUNTER — Encounter: Payer: Self-pay | Admitting: Infectious Disease

## 2015-02-10 ENCOUNTER — Other Ambulatory Visit: Payer: Self-pay | Admitting: *Deleted

## 2015-02-10 DIAGNOSIS — N528 Other male erectile dysfunction: Secondary | ICD-10-CM

## 2015-02-10 MED ORDER — SILDENAFIL CITRATE 100 MG PO TABS: 100.0000 mg | ORAL_TABLET | Freq: Every day | ORAL | Status: DC | PRN

## 2015-02-18 ENCOUNTER — Ambulatory Visit (INDEPENDENT_AMBULATORY_CARE_PROVIDER_SITE_OTHER): Payer: Self-pay | Admitting: *Deleted

## 2015-02-18 ENCOUNTER — Ambulatory Visit: Payer: Self-pay | Admitting: *Deleted

## 2015-02-18 VITALS — BP 120/77 | HR 62 | Temp 98.0°F | Resp 16 | Wt 197.0 lb

## 2015-02-18 DIAGNOSIS — Z006 Encounter for examination for normal comparison and control in clinical research program: Secondary | ICD-10-CM

## 2015-02-18 DIAGNOSIS — F1010 Alcohol abuse, uncomplicated: Secondary | ICD-10-CM

## 2015-02-18 LAB — COMPREHENSIVE METABOLIC PANEL
ALT: 13 U/L (ref 0–53)
AST: 20 U/L (ref 0–37)
Albumin: 4.1 g/dL (ref 3.5–5.2)
Alkaline Phosphatase: 56 U/L (ref 39–117)
BILIRUBIN TOTAL: 0.4 mg/dL (ref 0.2–1.2)
BUN: 8 mg/dL (ref 6–23)
CHLORIDE: 105 meq/L (ref 96–112)
CO2: 21 meq/L (ref 19–32)
Calcium: 9.2 mg/dL (ref 8.4–10.5)
Creat: 0.87 mg/dL (ref 0.50–1.35)
Glucose, Bld: 82 mg/dL (ref 70–99)
Potassium: 4.4 mEq/L (ref 3.5–5.3)
SODIUM: 136 meq/L (ref 135–145)
Total Protein: 7.3 g/dL (ref 6.0–8.3)

## 2015-02-18 LAB — BILIRUBIN, DIRECT: Bilirubin, Direct: 0.1 mg/dL (ref 0.0–0.3)

## 2015-02-19 NOTE — BH Specialist Note (Signed)
Arthur SporeWesley was present for his scheduled appointment today.  Client was oriented times four with good affect and dress.  Client was alert and talkative.  Client shared that he had been clean now from alcohol consumption for 34 days.  Client stated that he had tried once before to stop drinking but only made it 29 days so this was a big deal that he made it past the 30 day mark.  Client communicated that he was excited and ready to get a place to stay instead of living out of his car and going from friends house to friends house.  Client shared that he is taking things slow with his ex boyfriend as he agreed with counselor that he wants to keep his physical and mental status the priority in his life right now. Counselor educated client on the 5 stages of recovery specifically reviewing acknowledgement stage and early recovery stage.  Counselor inquired with client if he was still attending support groups to which he stated that he was on a weekly basis. Client expressed his appreciation of all the different people and supports he had received since getting diagnosed with HIV.  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  Client said that he was filling his days with productive activities that keeps his mind off using. Client shared that he felt good health wise and reported that he is still undertectable. Counselor encouraged client to keep up all the good work and effort he has been making with his recovery.    Jenel LucksJodi Havoc Sanluis, LPCA, MA [facility]

## 2015-02-20 NOTE — Progress Notes (Signed)
Kholton is here for A53Gerri Spore53, week 16 visit. He is doing well and in great spirits. His VL is undetectable and since he has quite drinking his blood pressure has also come down. He is pleased about this. Blood was drawn with no problems and vital signs are stable. Study med was dispensed and next appointment scheduled for 03/18/2015 @11am . Tacey HeapElisha Marko Skalski RN

## 2015-03-04 ENCOUNTER — Ambulatory Visit: Payer: Self-pay

## 2015-03-05 ENCOUNTER — Ambulatory Visit: Payer: Self-pay | Admitting: *Deleted

## 2015-03-05 DIAGNOSIS — F1010 Alcohol abuse, uncomplicated: Secondary | ICD-10-CM

## 2015-03-05 NOTE — BH Specialist Note (Signed)
Gerri SporeWesley was present for his scheduled appointment today.  Client called yesterday and cancelled and rescheduled.  Client was oriented times four with good affect and dress.  Client was alert and talkative.  Client communicated he was a bit frustrated at the recent phone call he received from the housing authority stating there had been a mistake and he did not get the apartment he thought he was getting. Client stated that he knew things would eventually work out but was really disappointed because he is still living out of his car.  Counselor educated client on ways to bounce back from this without using as client communicated that he is 50 days clean.  Counselor communicated ways such as: calling a friend, talking to counselor, thinking of a distraction, having a small pity party at [employer], don't let the disappointment define you, and most importantly use the hurt.  Client agreed that relapsing would not happen because this was not worth messing up his sobriety.  Counselor complimented client for his great attitude and shared how progress he had made since he first started coming to therapy.  Counselor discussed with client coping skills by using the alphabet.  For example: a=anger management, B=belong to a support group, C=communicate feelings appropriately etc. Client was cooperative and invested in the exercise.  Client had a good attitude and engaged in all topic discussions. Client stated that his substance abuse groups were going well and that he was refraining from alcohol consistently now.  Client made another appointment to see counselor in two weeks.   Jenel LucksJodi Clarabel Marion, LPCA, MA [facility]

## 2015-03-06 ENCOUNTER — Encounter: Payer: Self-pay | Admitting: Infectious Disease

## 2015-03-06 LAB — HIV-1 RNA QUANT-NO REFLEX-BLD: HIV-1 RNA Viral Load: <40

## 2015-03-06 LAB — CD4/CD8 (T-HELPER/T-SUPPRESSOR CELL)
CD4%: 33.9
CD4: 881
CD8 % Suppressor T Cell: 43.8
CD8: 1139

## 2015-03-17 ENCOUNTER — Telehealth: Payer: Self-pay | Admitting: *Deleted

## 2015-03-17 NOTE — Telephone Encounter (Signed)
Viagra has arrived from patient assistance. Left message notifying patient.  Viagra 100mg  #30  Lot [id number]  ndc [id number] exp 09/11/19

## 2015-03-18 ENCOUNTER — Ambulatory Visit (INDEPENDENT_AMBULATORY_CARE_PROVIDER_SITE_OTHER): Payer: Self-pay | Admitting: *Deleted

## 2015-03-18 VITALS — BP 121/83 | HR 62 | Temp 98.2°F | Resp 16 | Wt 194.8 lb

## 2015-03-18 DIAGNOSIS — Z006 Encounter for examination for normal comparison and control in clinical research program: Secondary | ICD-10-CM

## 2015-03-18 NOTE — Progress Notes (Signed)
Arthur [name] is here for A5353, week 16. Assessment unchanged since last study visit. He returned study drug and new prescription was dispensed. Vital signs are stable and blood was drawn with no problems. He received $50 gift card for visit. Next appt scheduled for Tues., Apr 14, 2015 @ 11am. Tacey HeapElisha Epperson RN

## 2015-03-19 ENCOUNTER — Ambulatory Visit: Payer: Self-pay | Admitting: *Deleted

## 2015-03-19 DIAGNOSIS — F1010 Alcohol abuse, uncomplicated: Secondary | ICD-10-CM

## 2015-03-19 NOTE — BH Specialist Note (Signed)
Arthur SporeWesley was present for his scheduled appointment today with counselor.  Client was oriented times four with good affect and dress.  Client was alert and talkative.  Client's mood was a bit down as his housing situation is not resolved and client is still living out of his car and occasionally staying with friends.  Client communicated that he is 63 days clean from alcohol and is committed to continuing with his recovery/sobriety.  Client stated that he is attending [facility] substance abuse groups 2X weekly and goes to [facility] support group for substance abuse and HIV diagnosis.  Client shared that his relationship with his family is going well and is happy about that as a result.  Client indicated that he had a serious craving this weekend when he walked into a [employer] to buy beer.  As client was considering buying the beer, he looked up and saw a member of his substance group and felt it was a sign to not buy the beer and not use.  Client stated this incident motivated him more to stick with his sobriety.  Counselor educated client on the "pink cloud" of recovery. Counselor talked to client about self delusion and how it can often lead to relapse. Counselor further explained that self delusion does not listen to reason.  Counselor shared with client to be smart not necessarily strong.  Client agreed and appeared to be listening attentively.  Counselor provided support and encouragement accordingly.  Arthur [name], LPCA, MA [facility]

## 2015-03-30 ENCOUNTER — Encounter: Payer: Self-pay | Admitting: Infectious Disease

## 2015-04-02 ENCOUNTER — Ambulatory Visit: Payer: Self-pay | Admitting: *Deleted

## 2015-04-02 ENCOUNTER — Telehealth: Payer: Self-pay | Admitting: *Deleted

## 2015-04-02 NOTE — Telephone Encounter (Signed)
Gerri SporeWesley called and wanted something for allergies. He said he has been taking loratadine, but that they have gotten worse past few days. His sx have been sneezing, eyes itching and watering, throat soreness. I told him it sounded more like a cold and to try something like dayquil. He denied any fever, coughing up any phlegm, etc. I did tell him to call back if it got worse.

## 2015-04-08 ENCOUNTER — Encounter: Payer: Self-pay | Admitting: Infectious Disease

## 2015-04-08 LAB — HIV-1 RNA QUANT-NO REFLEX-BLD: HIV-1 RNA Viral Load: <40

## 2015-04-09 ENCOUNTER — Ambulatory Visit: Payer: Self-pay | Admitting: *Deleted

## 2015-04-09 DIAGNOSIS — F1010 Alcohol abuse, uncomplicated: Secondary | ICD-10-CM

## 2015-04-14 ENCOUNTER — Encounter (INDEPENDENT_AMBULATORY_CARE_PROVIDER_SITE_OTHER): Payer: Self-pay | Admitting: *Deleted

## 2015-04-14 VITALS — BP 124/78 | HR 53 | Temp 98.1°F | Resp 16 | Wt 196.5 lb

## 2015-04-14 DIAGNOSIS — Z006 Encounter for examination for normal comparison and control in clinical research program: Secondary | ICD-10-CM

## 2015-04-14 LAB — HIV-1 RNA QUANT-NO REFLEX-BLD: HIV-1 RNA Viral Load: <40

## 2015-04-14 NOTE — Progress Notes (Signed)
Arthur [name] is here for his week 20 study visit. He denies any current problems and seems to be in good spirits. He says he is now 89 days sober and feels good. He did not bring his pill bottles with him, but will bring them back tomorrow and pick up the new bottles. He says he has 3 pills left and hasn't missed any doses. He will return in 4 weeks for the next visit.

## 2015-04-16 NOTE — BH Specialist Note (Signed)
Arthur [name] was present for his scheduled appointment today with counselor.  Client was oriented times four with good affect and dress.   Client was alert and talkative.  Client communicated about continued frustration about his housing situation but indicated that he would be getting the apartment but it would be another week before he would be able to move in.  Client stated he was grateful for the help but just wished he could hurry up and move in as he is sleeping in his car.  Client shared that he has remained sober and has not drank in 84 days.  Client demonstrated pride as he discussed his sobriety.  Client indicated that he was going to visit with his family in [place].  Client states that his family stopped having a relationship with him because of his drinking a few years ago and he is just now starting to talk and visit with them on a regular basis.  Client shared that restoring these relationships was very important to him that he has missed his family terribly.  Counselor talked with client about rebuilding relationships after addiction.  Counselor educated client about trust, respect, and patience during this time of healing his relationships with his family members.  Counselor encouraged client to continue on the straight path of sobriety and reminded him what he stands to loose if he relapses by having client make a list on an index card.  Counselor processed the list with client and encouraged client to keep the list with him to refer too if he begins to desire to use. Client shared that he has applied to [employer] and is hoping to hear back soon.  Client stated he was ready to go back to work now that he is clean and feels confident enough to do so.    Arthur [name], LPCA, MA [facility]

## 2015-04-21 ENCOUNTER — Ambulatory Visit (INDEPENDENT_AMBULATORY_CARE_PROVIDER_SITE_OTHER): Payer: Self-pay | Admitting: Infectious Disease

## 2015-04-21 ENCOUNTER — Encounter: Payer: Self-pay | Admitting: Infectious Disease

## 2015-04-21 VITALS — BP 146/88 | HR 57 | Temp 98.3°F | Ht 69.5 in | Wt 189.0 lb

## 2015-04-21 DIAGNOSIS — N528 Other male erectile dysfunction: Secondary | ICD-10-CM

## 2015-04-21 DIAGNOSIS — N529 Male erectile dysfunction, unspecified: Secondary | ICD-10-CM

## 2015-04-21 DIAGNOSIS — B20 Human immunodeficiency virus [HIV] disease: Secondary | ICD-10-CM

## 2015-04-21 DIAGNOSIS — F319 Bipolar disorder, unspecified: Secondary | ICD-10-CM

## 2015-04-21 DIAGNOSIS — A529 Late syphilis, unspecified: Secondary | ICD-10-CM

## 2015-04-21 NOTE — Progress Notes (Signed)
   Subjective:    Patient ID: Arthur [name], male    DOB: 1956/10/16, 59 y.o.   MRN: [id number]  HPI   59 year old with recently diagnosed HIV. He has history of bipolar disorder. He states that he tested negative last SPring with PCP.   He estimates he contracted the HIV from unprotected sex with another man in the past several months. His CD4 is healthy above 700. His VL was nearly 260K. HIV genotype showed no resistance and he enrolled into ACTG study and has been on  Dolutegravir and Lamivudine (dual therapy) .  He has an undetectable viral load and healthy CD4 count.  HIV <40  Lab Results  Component Value Date   CD4TABS 881 02/18/2015   CD4TABS 759 12/24/2014   CD4TABS 710 10/30/2014    His depression is responded well to Wellbutrin. He is in good spirits he is stop smoking he is using Viagra for erectile dysfunction.  Review of Systems  Constitutional: Negative for fever, chills, diaphoresis, activity change, appetite change, fatigue and unexpected weight change.  HENT: Negative for congestion, rhinorrhea, sinus pressure, sneezing, sore throat and trouble swallowing.   Eyes: Negative for photophobia and visual disturbance.  Respiratory: Negative for cough, chest tightness, shortness of breath, wheezing and stridor.   Cardiovascular: Negative for chest pain, palpitations and leg swelling.  Gastrointestinal: Negative for nausea, vomiting, abdominal pain, diarrhea, constipation, blood in stool, abdominal distention and anal bleeding.  Genitourinary: Negative for dysuria, hematuria, flank pain and difficulty urinating.  Musculoskeletal: Negative for myalgias, back pain, joint swelling, arthralgias and gait problem.  Skin: Negative for color change, pallor, rash and wound.  Neurological: Negative for dizziness, tremors, weakness and light-headedness.  Hematological: Negative for adenopathy. Does not bruise/bleed easily.  Psychiatric/Behavioral: Negative for behavioral problems,  confusion, sleep disturbance, dysphoric mood, decreased concentration and agitation.       Objective:   Physical Exam  Constitutional: He is oriented to person, place, and time. He appears well-developed and well-nourished. No distress.  HENT:  Head: Normocephalic and atraumatic.  Mouth/Throat: Oropharynx is clear and moist. No oropharyngeal exudate.  Eyes: Conjunctivae and EOM are normal. Pupils are equal, round, and reactive to light. No scleral icterus.  Neck: Normal range of motion. Neck supple. No JVD present.  Cardiovascular: Normal rate, regular rhythm and normal heart sounds.  Exam reveals no gallop and no friction rub.   No murmur heard. Pulmonary/Chest: Effort normal and breath sounds normal. No respiratory distress. He has no wheezes. He has no rales. He exhibits no tenderness.  Abdominal: He exhibits no distension and no mass. There is no tenderness. There is no rebound and no guarding.  Musculoskeletal: He exhibits no edema or tenderness.  Lymphadenopathy:    He has no cervical adenopathy.  Neurological: He is alert and oriented to person, place, and time. He has normal reflexes. He exhibits normal muscle tone. Coordination normal.  Skin: Skin is warm and dry. He is not diaphoretic. No erythema. No pallor.  Psychiatric: He has a normal mood and affect. His behavior is normal. Judgment and thought content normal.          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

## 2015-04-23 ENCOUNTER — Ambulatory Visit: Payer: Self-pay | Admitting: *Deleted

## 2015-04-23 DIAGNOSIS — F1010 Alcohol abuse, uncomplicated: Secondary | ICD-10-CM

## 2015-04-23 NOTE — BH Specialist Note (Signed)
Arthur [name] was present for his scheduled appointment today.  Client was oriented times four with good affect and dress.  Client was alert  Client reported that he has been clean for 98 days.  Client shared that he was being toasted tonight at the [employer] dinner as a success story.  Client was in great spirits and smiling constantly from being so happy.  Client communicated to counselor that he had indeed moved into his apartment and had been able to furnish it with the help of different churches and agency's. Client stated that he had had a few dreams lately that were not good because they were about drinking and relapsing.  Counselor reminded client that he has been sober for going on 100 days and that he would benefit from not taking to personal what his dreams are about.  Counselor educated client on making sure he is anchored to the right things so that he is able to easily resist and avoid relapsing.  Counselor discussed after care plan with client.  Client identified a couple of local support groups like AA he could attend once he has completed his outpatient treatment program with [facility].  Client was cooperative and enthusiastic throughout session today.  Counselor communicated to client that we can spread the appointments out a little more since he is has been progressing so well lately.  Jenel LucksJodi Creig Landin, LPCA, MA [facility]

## 2015-05-07 ENCOUNTER — Ambulatory Visit: Payer: Self-pay | Admitting: *Deleted

## 2015-05-07 DIAGNOSIS — F1010 Alcohol abuse, uncomplicated: Secondary | ICD-10-CM

## 2015-05-12 ENCOUNTER — Encounter (INDEPENDENT_AMBULATORY_CARE_PROVIDER_SITE_OTHER): Payer: Self-pay | Admitting: *Deleted

## 2015-05-12 VITALS — BP 131/82 | HR 58 | Temp 98.2°F | Resp 16 | Wt 187.5 lb

## 2015-05-12 DIAGNOSIS — Z006 Encounter for examination for normal comparison and control in clinical research program: Secondary | ICD-10-CM

## 2015-05-12 DIAGNOSIS — N528 Other male erectile dysfunction: Secondary | ICD-10-CM

## 2015-05-12 MED ORDER — SILDENAFIL CITRATE 100 MG PO TABS
100.0000 mg | ORAL_TABLET | Freq: Every day | ORAL | Status: DC | PRN
Start: 2015-05-12 — End: 2015-08-21

## 2015-05-12 NOTE — Progress Notes (Signed)
Arthur [name] is here for his week 24 A5353 study visit. He says he has been very adherent with his meds and hasn't missed any doses. He has lost approx. 8-10 lbs over the last month or two and says he has been very active, but not trying to lose weight. He denies any symptoms or other problems, says he is sleeping well, not drinking. He was also screened for the Reprieve study today. Informed consent was obtained after he had a chance to take the consent home and read over it. We discussed it in detail and answered all his questions regarding the study. He understands he will be given pitavastatin which may be placebo to take daily and we will monitor him for cardiac events and other problems. He is also interested in the substudy if the scanner will be in service by the time he needs to enroll on study. We went over the risks of the scan, etc. He will return for A5353 in 8 weeks.

## 2015-05-12 NOTE — BH Specialist Note (Signed)
Arthur [name] was present today for his scheduled appointment.  Client was oriented times four with good affect and dress.  Client was alert and talkative.  Client stated that he was 112 days clean.  Client communicated that he felt great and that life was truly moving in the right direction.  Client shared that he was volunteering for the [employer] program here locally one day a week.  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 Counselor complimented client about staying busy and finding productive things to do with his time.  Client stated that he is wanting now to work part time and is looking forward to getting back in that mode. Client talked about setting boundaries with people at [facility] (his local HIV support group) as many clients there are constantly asking him for a ride and it is getting over whelming.  Client shared that he already takes a couple home after every meeting and he does not want to have to be obligated to take anyone else home.  Counselor talked with client about saying no and meaning it.  Counselor explained that "no: is a complete sentence.  Client laughed and said that made a lot of sense. Client is doing well and certainly moving into a good and stable place with his sobriety.  Counselor suggests continued monitoring but less frequently.  Client made an additional appointment for three weeks out instead of two.    Arthur [name], LPCA, MA [facility]

## 2015-05-13 LAB — LIPID PANEL
Cholesterol: 136 mg/dL (ref 0–200)
HDL: 52 mg/dL (ref >=40)
LDL Cholesterol: 72 mg/dL (ref 0–99)
Total CHOL/HDL Ratio: 2.6 Ratio
Triglycerides: 62 mg/dL (ref <150)
VLDL: 12 mg/dL (ref 0–40)

## 2015-05-13 LAB — COMPREHENSIVE METABOLIC PANEL
ALT: 18 U/L (ref 0–53)
AST: 35 U/L (ref 0–37)
Albumin: 4.1 g/dL (ref 3.5–5.2)
Alkaline Phosphatase: 60 U/L (ref 39–117)
BUN: 8 mg/dL (ref 6–23)
CO2: 24 meq/L (ref 19–32)
Calcium: 9 mg/dL (ref 8.4–10.5)
Chloride: 104 mEq/L (ref 96–112)
Creat: 0.89 mg/dL (ref 0.50–1.35)
Glucose, Bld: 99 mg/dL (ref 70–99)
Potassium: 4.6 mEq/L (ref 3.5–5.3)
Sodium: 137 mEq/L (ref 135–145)
Total Bilirubin: 0.5 mg/dL (ref 0.2–1.2)
Total Protein: 6.6 g/dL (ref 6.0–8.3)

## 2015-05-13 LAB — BILIRUBIN,DIRECT & INDIRECT (FRACTIONATED)
BILIRUBIN DIRECT: 0.1 mg/dL (ref 0.0–0.3)
Indirect Bilirubin: 0.4 mg/dL (ref 0.2–1.2)

## 2015-05-15 ENCOUNTER — Encounter: Payer: Self-pay | Admitting: Infectious Disease

## 2015-05-18 ENCOUNTER — Encounter: Payer: Self-pay | Admitting: *Deleted

## 2015-05-21 ENCOUNTER — Encounter: Payer: Self-pay | Admitting: Infectious Disease

## 2015-05-26 ENCOUNTER — Ambulatory Visit: Payer: Self-pay | Admitting: *Deleted

## 2015-05-26 ENCOUNTER — Other Ambulatory Visit: Payer: Self-pay | Admitting: *Deleted

## 2015-05-26 DIAGNOSIS — Z006 Encounter for examination for normal comparison and control in clinical research program: Secondary | ICD-10-CM

## 2015-06-02 ENCOUNTER — Ambulatory Visit: Payer: Self-pay | Admitting: *Deleted

## 2015-06-02 DIAGNOSIS — F1010 Alcohol abuse, uncomplicated: Secondary | ICD-10-CM

## 2015-06-02 NOTE — BH Specialist Note (Signed)
Arlynn was present for his schedule appointment today.  Client was oriented times four with good affect and dress.  Client was alert and talkative with counselor.  Client was in good spirits as evidenced by smiling and talking positively about his life and recovery.  Client admitted that he has been clean now for around 138 days. Counselor gave client props and shared that he has accomplished something very difficult and that he should be proud of himself.  Client stated he was but recognizes that relapse could happen at any time.  Client shared that he is continuing to attend [employer] and is active in his church.  Client shared that he knows that he has to keep recovery at the forefront of his mind at all times to avoid realpse.  Counselor reviewed the phases of relapse and typical warning signs after a substantial sobriety period such as: denial, immobilization, overeacting, avoidance,  crisis building, depression, defensiveness, loss of control of behavior etc.  Counselor discussed symptoms such as: guilt, conscious lying, discontinuance of treatment, self-pity, loss of daily structure. Client shared that he sometimes feels afraid that he might relapse in the future and fears that happening.  Counselor reviewed with client the difference between relapsing and lapsing.  Counselor encouraged client understand that relapse is not failure but an interruption to sobriety and a normal part of recovery.  Counselor educated client on the fact that treatment of chronic diseases involves changing deeply imbedded behaviors, and that relapse does not mean treatment has failed. Counselor reminded client what should be done if relapse/lapse occurs. Client agreed and made another appointment to meet with counselor in two weeks.   [name], LPCA, MA [facility]

## 2015-06-05 ENCOUNTER — Encounter: Payer: Self-pay | Admitting: Infectious Disease

## 2015-06-05 LAB — HIV-1 RNA QUANT-NO REFLEX-BLD

## 2015-06-05 LAB — CD4/CD8 (T-HELPER/T-SUPPRESSOR CELL)
CD4%: 35.2
CD4: 880
CD8 % Suppressor T Cell: 38.5
CD8: 963

## 2015-06-16 ENCOUNTER — Ambulatory Visit: Payer: Self-pay | Admitting: *Deleted

## 2015-06-18 ENCOUNTER — Encounter (INDEPENDENT_AMBULATORY_CARE_PROVIDER_SITE_OTHER): Payer: Self-pay | Admitting: *Deleted

## 2015-06-18 ENCOUNTER — Ambulatory Visit: Payer: Self-pay | Admitting: *Deleted

## 2015-06-18 VITALS — BP 108/67 | HR 54 | Temp 98.1°F | Resp 16 | Wt 181.0 lb

## 2015-06-18 DIAGNOSIS — F1010 Alcohol abuse, uncomplicated: Secondary | ICD-10-CM

## 2015-06-18 DIAGNOSIS — Z006 Encounter for examination for normal comparison and control in clinical research program: Secondary | ICD-10-CM

## 2015-06-18 LAB — COMPREHENSIVE METABOLIC PANEL
ALBUMIN: 4.3 g/dL (ref 3.5–5.2)
ALT: 14 U/L (ref 0–53)
AST: 24 U/L (ref 0–37)
Alkaline Phosphatase: 66 U/L (ref 39–117)
BUN: 12 mg/dL (ref 6–23)
CALCIUM: 9.2 mg/dL (ref 8.4–10.5)
CO2: 26 mEq/L (ref 19–32)
Chloride: 102 mEq/L (ref 96–112)
Creat: 0.95 mg/dL (ref 0.50–1.35)
Glucose, Bld: 92 mg/dL (ref 70–99)
POTASSIUM: 4.7 meq/L (ref 3.5–5.3)
Sodium: 138 mEq/L (ref 135–145)
Total Bilirubin: 0.5 mg/dL (ref 0.2–1.2)
Total Protein: 6.8 g/dL (ref 6.0–8.3)

## 2015-06-18 NOTE — Progress Notes (Signed)
Arthur [name] enrolled on the Reprieve study and substudy today. He currently denies any problems or concerns. He is also on the ACTG A5353 study for which coenrollment is allowed after they had been on that study for 6 months. He will get the CT for study tomorrow morning and was instructed to not eat any solids for 4 hours, no caffeine and he has not taken any viagra in the past week. He was instructed on adherence with his study med and how to take it, daily with or without food. He was also given a copy of the food and lifestyle guide for the study. He will return in 1 month for the next Reprieve study visit. His A5353 visit is 7/26.

## 2015-06-18 NOTE — BH Specialist Note (Signed)
Arthur [name] met with counselor for an impromptu appointment since he was here for lab work.  Client was in good spirits and oriented times four.  Client was talkative and shared openly and freely.  Client reported that he is graduating form his [facility] treatment group. Counselor discussed with client the need to stay busy and keep himself involved with positive activities and positive non using people.  Counselor shared with client that idle hands can bring forth relapse if not careful. Counselor educated client on the importance of scheduling his days and free time accordingly. Client talked to counselor about possible creating a peer support position here at [facility] to assist other HIV diagnosed patients. Client was motivated and inquired with counselor how to go about following through with getting the necessary training.  Counselor printed off different training sites and dates.  Counselor and client discussed the program with lead nurse.  Client was excited and motivated to start this particular program and move forward with concentrating on helping others instead of dwelling on the negative in his life.  Client agreed and stated that he had been clean now over 154 days.  Counselor gave client props for his wonderful accomplishment and encouraged him to keep up the good work.  Client made another appointment with counselor two weeks out.  Rolena Infante, LPCA, MA [facility]

## 2015-06-19 ENCOUNTER — Encounter (HOSPITAL_COMMUNITY): Payer: Self-pay

## 2015-06-19 ENCOUNTER — Ambulatory Visit (HOSPITAL_COMMUNITY)
Admission: RE | Admit: 2015-06-19 | Discharge: 2015-06-19 | Disposition: A | Discharge status: Home / Self Care | Payer: Self-pay | Source: Ambulatory Visit | Attending: Infectious Disease | Admitting: Infectious Disease

## 2015-06-19 DIAGNOSIS — Z006 Encounter for examination for normal comparison and control in clinical research program: Secondary | ICD-10-CM | POA: Insufficient documentation

## 2015-06-19 LAB — HIV-1 RNA QUANT-NO REFLEX-BLD
HIV 1 RNA QUANT: 41 {copies}/mL — AB (ref <20)
HIV-1 RNA Quant, Log: 1.61 {Log} — ABNORMAL HIGH (ref <1.30)

## 2015-06-19 MED ORDER — IOHEXOL 350 MG/ML SOLN
80.0000 mL | Freq: Once | INTRAVENOUS | Status: AC | PRN
  Administered 2015-06-19: 80 mL via INTRAVENOUS

## 2015-06-25 ENCOUNTER — Ambulatory Visit: Payer: Self-pay | Admitting: *Deleted

## 2015-06-25 DIAGNOSIS — F1010 Alcohol abuse, uncomplicated: Secondary | ICD-10-CM

## 2015-06-30 NOTE — BH Specialist Note (Signed)
[name] was present today for his scheduled appointment with counselor. Client was oriented times four with good affect and dress.  Client was alert and talkative.  Client mentioned that he is still working on the "peer support" program and would really like to be involved with getting that program off the ground. Client stated that he is still clean and sober and is truly enjoying sobriety.  Client said that so much has happened to him in the last year that his head is spinning a bit.  Client communicated that he is very happy and please with how his life is going at the moment and plans to continue on with sobriety. Client admitted that he is staying busy involved with [employer] his church.  Client said that staying busy with productive activities is doing wonders for him and his self esteem.  Counselor educated client on recreational pursuits and taking time to reflect and relax in order to decompress from the daily grind.  Client agreed and and says that he knows now that is very important in his life....slowing down and smelling the roses mentality.  Counselor complimented client for all his hard work and effort towards his recovery and he align process.  Counselor made another appointment for client 3 weeks out.  Jenel LucksJodi Wilton Thrall, LPCA, MA [facility]

## 2015-07-02 ENCOUNTER — Encounter: Payer: Self-pay | Admitting: Infectious Disease

## 2015-07-02 LAB — CD4/CD8 (T-HELPER/T-SUPPRESSOR CELL)
CD4%: 37.2
CD4: 744
CD8 % Suppressor T Cell: 36.9
CD8: 738

## 2015-07-07 ENCOUNTER — Ambulatory Visit: Payer: Self-pay | Admitting: *Deleted

## 2015-07-07 ENCOUNTER — Encounter: Payer: Self-pay | Admitting: *Deleted

## 2015-07-07 VITALS — BP 131/82 | HR 51 | Temp 98.0°F | Resp 16 | Wt 183.5 lb

## 2015-07-07 DIAGNOSIS — Z006 Encounter for examination for normal comparison and control in clinical research program: Secondary | ICD-10-CM

## 2015-07-07 LAB — BILIRUBIN,DIRECT & INDIRECT (FRACTIONATED)
BILIRUBIN INDIRECT: 0.3 mg/dL (ref 0.2–1.2)
Bilirubin, Direct: 0.1 mg/dL (ref <=0.2)

## 2015-07-07 LAB — COMPREHENSIVE METABOLIC PANEL
ALT: 19 U/L (ref 9–46)
AST: 40 U/L — ABNORMAL HIGH (ref 10–35)
Albumin: 4.2 g/dL (ref 3.6–5.1)
Alkaline Phosphatase: 65 U/L (ref 40–115)
BILIRUBIN TOTAL: 0.4 mg/dL (ref 0.2–1.2)
BUN: 9 mg/dL (ref 7–25)
CALCIUM: 9.1 mg/dL (ref 8.6–10.3)
CO2: 25 mEq/L (ref 20–31)
CREATININE: 0.93 mg/dL (ref 0.70–1.33)
Chloride: 106 mEq/L (ref 98–110)
Glucose, Bld: 84 mg/dL (ref 65–99)
POTASSIUM: 4 meq/L (ref 3.5–5.3)
SODIUM: 140 meq/L (ref 135–146)
TOTAL PROTEIN: 6.6 g/dL (ref 6.1–8.1)

## 2015-07-07 LAB — CBC WITH DIFFERENTIAL/PLATELET
BASOS ABS: 0 10*3/uL (ref 0.0–0.1)
BASOS PCT: 0 % (ref 0–1)
EOS PCT: 2 % (ref 0–5)
Eosinophils Absolute: 0.1 10*3/uL (ref 0.0–0.7)
HCT: 34.4 % — ABNORMAL LOW (ref 39.0–52.0)
HEMOGLOBIN: 10.9 g/dL — AB (ref 13.0–17.0)
Lymphocytes Relative: 41 % (ref 12–46)
Lymphs Abs: 2 10*3/uL (ref 0.7–4.0)
MCH: 28.7 pg (ref 26.0–34.0)
MCHC: 31.7 g/dL (ref 30.0–36.0)
MCV: 90.5 fL (ref 78.0–100.0)
MPV: 9.1 fL (ref 8.6–12.4)
Monocytes Absolute: 0.6 10*3/uL (ref 0.1–1.0)
Monocytes Relative: 12 % (ref 3–12)
Neutro Abs: 2.2 10*3/uL (ref 1.7–7.7)
Neutrophils Relative %: 45 % (ref 43–77)
PLATELETS: 344 10*3/uL (ref 150–400)
RBC: 3.8 MIL/uL — AB (ref 4.22–5.81)
RDW: 14.7 % (ref 11.5–15.5)
WBC: 4.9 10*3/uL (ref 4.0–10.5)

## 2015-07-07 NOTE — Progress Notes (Signed)
Arthur [name] is here for his week 32 A5353 study visit. He says he has not missed any doses of his ARVs and his pill count shows 100% adherence.  He is also on the reprieve study and has an appt for the 1 month visit for that next week. He will return in 8 weeks for this study.

## 2015-07-08 ENCOUNTER — Ambulatory Visit: Payer: Self-pay | Admitting: *Deleted

## 2015-07-14 ENCOUNTER — Encounter (INDEPENDENT_AMBULATORY_CARE_PROVIDER_SITE_OTHER): Payer: Self-pay | Admitting: *Deleted

## 2015-07-14 VITALS — BP 128/79 | HR 55 | Temp 98.6°F | Resp 16 | Wt 183.0 lb

## 2015-07-14 DIAGNOSIS — Z006 Encounter for examination for normal comparison and control in clinical research program: Secondary | ICD-10-CM

## 2015-07-14 LAB — COMPREHENSIVE METABOLIC PANEL
ALT: 16 U/L (ref 9–46)
AST: 21 U/L (ref 10–35)
Albumin: 3.9 g/dL (ref 3.6–5.1)
Alkaline Phosphatase: 62 U/L (ref 40–115)
BUN: 8 mg/dL (ref 7–25)
CO2: 24 mmol/L (ref 20–31)
Calcium: 9.1 mg/dL (ref 8.6–10.3)
Chloride: 104 mmol/L (ref 98–110)
Creat: 0.91 mg/dL (ref 0.70–1.33)
Glucose, Bld: 88 mg/dL (ref 65–99)
Potassium: 4.5 mmol/L (ref 3.5–5.3)
SODIUM: 139 mmol/L (ref 135–146)
Total Bilirubin: 0.5 mg/dL (ref 0.2–1.2)
Total Protein: 6.4 g/dL (ref 6.1–8.1)

## 2015-07-14 NOTE — Progress Notes (Signed)
Brook is here for his month 1 Reprieve study visit. He denies any missed doses or complaints. He will return in November for the 4 mth Reprieve study visit.

## 2015-07-16 ENCOUNTER — Ambulatory Visit: Payer: Self-pay | Admitting: *Deleted

## 2015-07-16 DIAGNOSIS — F1010 Alcohol abuse, uncomplicated: Secondary | ICD-10-CM

## 2015-07-16 NOTE — BH Specialist Note (Signed)
Arthur [name] was present today for his scheduled appointment with counselor.  Client was oriented times four with good affect and dress.  Client was alert and in good spirits.  Client communicated that he has now graduated from [facility] substance abuse treatment and is doing well.  Counselor talked with client about concluding counseling appointments as a result of his success and recovery efforts.  Client has been clean for several months and is functioning well emotionally.  Client expresses gratitude and indicates his goals he has for the future.  Client communicated that he is pursuing a peer support program and wanted assistance finding training.  Counselor assisted client and provided the necessary information for training to become certified.  Counselor complimented client about his transformation and encouraged him to stay the course.  Counselor shared with client that he is not dropped but no longer requires counselor services.  Client agreed and indicated if he ever needed services again he would let counselor know. Counselor encouraged client to continue attending his support groups [employer] and AA.  Client agreed that he will continue to go weekly to these meetings to ensure his sobriety and emotional support.  Counselor will follow up with client in a few weeks to get an update.  [name], LPCA, MA [facility]

## 2015-07-20 ENCOUNTER — Encounter: Payer: Self-pay | Admitting: Infectious Disease

## 2015-07-23 ENCOUNTER — Ambulatory Visit: Payer: Self-pay | Admitting: *Deleted

## 2015-07-29 ENCOUNTER — Encounter: Payer: Self-pay | Admitting: Infectious Disease

## 2015-07-29 LAB — HIV-1 RNA QUANT-NO REFLEX-BLD: HIV-1 RNA Viral Load: <40

## 2015-07-30 ENCOUNTER — Encounter: Payer: Self-pay | Admitting: *Deleted

## 2015-08-06 ENCOUNTER — Encounter: Payer: Self-pay | Admitting: Infectious Disease

## 2015-08-21 ENCOUNTER — Telehealth: Payer: Self-pay | Admitting: *Deleted

## 2015-08-21 DIAGNOSIS — N528 Other male erectile dysfunction: Secondary | ICD-10-CM

## 2015-08-21 MED ORDER — SILDENAFIL CITRATE 100 MG PO TABS: 100.0000 mg | ORAL_TABLET | Freq: Every day | ORAL | Status: DC | PRN

## 2015-08-21 NOTE — Telephone Encounter (Signed)
Arthur [name] called and said he has been trying to get his viagra refilled through [facility] but hasn't gotten a response. I will refill and let Pam know he needs patient assistance to fill.

## 2015-08-31 ENCOUNTER — Ambulatory Visit (INDEPENDENT_AMBULATORY_CARE_PROVIDER_SITE_OTHER): Payer: Self-pay | Admitting: Infectious Disease

## 2015-08-31 ENCOUNTER — Encounter: Payer: Self-pay | Admitting: Infectious Disease

## 2015-08-31 ENCOUNTER — Encounter (INDEPENDENT_AMBULATORY_CARE_PROVIDER_SITE_OTHER): Payer: Self-pay | Admitting: *Deleted

## 2015-08-31 VITALS — BP 147/88 | HR 52 | Temp 97.7°F | Wt 182.5 lb

## 2015-08-31 DIAGNOSIS — Z006 Encounter for examination for normal comparison and control in clinical research program: Secondary | ICD-10-CM

## 2015-08-31 DIAGNOSIS — A529 Late syphilis, unspecified: Secondary | ICD-10-CM

## 2015-08-31 DIAGNOSIS — F1010 Alcohol abuse, uncomplicated: Secondary | ICD-10-CM

## 2015-08-31 DIAGNOSIS — Z23 Encounter for immunization: Secondary | ICD-10-CM

## 2015-08-31 DIAGNOSIS — B20 Human immunodeficiency virus [HIV] disease: Secondary | ICD-10-CM

## 2015-08-31 DIAGNOSIS — F1011 Alcohol abuse, in remission: Secondary | ICD-10-CM

## 2015-08-31 DIAGNOSIS — F319 Bipolar disorder, unspecified: Secondary | ICD-10-CM

## 2015-08-31 LAB — CBC WITH DIFFERENTIAL/PLATELET
BASOS ABS: 0 10*3/uL (ref 0.0–0.1)
BASOS PCT: 1 % (ref 0–1)
Eosinophils Absolute: 0.1 10*3/uL (ref 0.0–0.7)
Eosinophils Relative: 2 % (ref 0–5)
HCT: 38.7 % — ABNORMAL LOW (ref 39.0–52.0)
HEMOGLOBIN: 12.5 g/dL — AB (ref 13.0–17.0)
LYMPHS ABS: 2.1 10*3/uL (ref 0.7–4.0)
Lymphocytes Relative: 43 % (ref 12–46)
MCH: 30.3 pg (ref 26.0–34.0)
MCHC: 32.3 g/dL (ref 30.0–36.0)
MCV: 93.7 fL (ref 78.0–100.0)
MONOS PCT: 10 % (ref 3–12)
MPV: 9.3 fL (ref 8.6–12.4)
Monocytes Absolute: 0.5 10*3/uL (ref 0.1–1.0)
NEUTROS ABS: 2.2 10*3/uL (ref 1.7–7.7)
NEUTROS PCT: 44 % (ref 43–77)
Platelets: 257 10*3/uL (ref 150–400)
RBC: 4.13 MIL/uL — ABNORMAL LOW (ref 4.22–5.81)
RDW: 17.2 % — ABNORMAL HIGH (ref 11.5–15.5)
WBC: 4.9 10*3/uL (ref 4.0–10.5)

## 2015-08-31 LAB — COMPREHENSIVE METABOLIC PANEL
ALT: 17 U/L (ref 9–46)
AST: 31 U/L (ref 10–35)
Albumin: 4.3 g/dL (ref 3.6–5.1)
Alkaline Phosphatase: 58 U/L (ref 40–115)
BUN: 7 mg/dL (ref 7–25)
CHLORIDE: 103 mmol/L (ref 98–110)
CO2: 27 mmol/L (ref 20–31)
Calcium: 9.6 mg/dL (ref 8.6–10.3)
Creat: 0.94 mg/dL (ref 0.70–1.33)
GLUCOSE: 87 mg/dL (ref 65–99)
POTASSIUM: 4.4 mmol/L (ref 3.5–5.3)
Sodium: 138 mmol/L (ref 135–146)
Total Bilirubin: 0.6 mg/dL (ref 0.2–1.2)
Total Protein: 6.7 g/dL (ref 6.1–8.1)

## 2015-08-31 LAB — BILIRUBIN, DIRECT: Bilirubin, Direct: 0.2 mg/dL (ref <=0.2)

## 2015-08-31 NOTE — Progress Notes (Signed)
Arthur [name] is here for his week 40 A5353 study visit. He denies any new problems or concerns. He saw dr. Daiva Eves this morning and will be starting Descovy and tivicay when the study ends in 3 months. He has a regular primary care physician now who prescribed Vitamin B-12, vitamin D and iron supplementation 2 months ago. He returns on November 7th for the week 48 study visit and for Reprieve.

## 2015-08-31 NOTE — Progress Notes (Signed)
Chief complaint: followup for HIV disease at 6 months on medications  Subjective:    Patient ID: Arthur [name], male    DOB: 1956-09-13, 59 y.o.   MRN: [id number]  HPI   59 year old with HIV diagnosed a little more than one year ago.  He has history of bipolar disorder and alcohol abuse in remission.   He has been enrolled into  ACTG study and has been on  Dolutegravir and Lamivudine (dual therapy) and is nearing the end of the study this December.  He has an undetectable viral load and healthy CD4 count.  HIV <40  Lab Results  Component Value Date   CD4TABS 744 06/18/2015   CD4TABS 880 05/12/2015   CD4TABS 881 02/18/2015    [name] is currently heading up our efforts at Peer Counsel[facility] program.  Review of Systems  Constitutional: Negative for fever, chills, diaphoresis, activity change, appetite change, fatigue and unexpected weight change.  HENT: Negative for congestion, rhinorrhea, sinus pressure, sneezing, sore throat and trouble swallowing.   Eyes: Negative for photophobia and visual disturbance.  Respiratory: Negative for cough, chest tightness, shortness of breath, wheezing and stridor.   Cardiovascular: Negative for chest pain, palpitations and leg swelling.  Gastrointestinal: Negative for nausea, vomiting, abdominal pain, diarrhea, constipation, blood in stool, abdominal distention and anal bleeding.  Genitourinary: Negative for dysuria, hematuria, flank pain and difficulty urinating.  Musculoskeletal: Negative for myalgias, back pain, joint swelling, arthralgias and gait problem.  Skin: Negative for color change, pallor, rash and wound.  Neurological: Negative for dizziness, tremors, weakness and light-headedness.  Hematological: Negative for adenopathy. Does not bruise/bleed easily.  Psychiatric/Behavioral: Negative for behavioral problems, confusion, sleep disturbance, dysphoric mood, decreased concentration and agitation.       Objective:   Physical Exam    Constitutional: He is oriented to person, place, and time. He appears well-developed and well-nourished. No distress.  HENT:  Head: Normocephalic and atraumatic.  Mouth/Throat: Oropharynx is clear and moist. No oropharyngeal exudate.  Eyes: Conjunctivae and EOM are normal. Pupils are equal, round, and reactive to light. No scleral icterus.  Neck: Normal range of motion. Neck supple. No JVD present.  Cardiovascular: Normal rate, regular rhythm and normal heart sounds.  Exam reveals no gallop and no friction rub.   No murmur heard. Pulmonary/Chest: Effort normal and breath sounds normal. No respiratory distress. He has no wheezes. He has no rales. He exhibits no tenderness.  Abdominal: He exhibits no distension and no mass. There is no tenderness. There is no rebound and no guarding.  Musculoskeletal: He exhibits no edema or tenderness.  Lymphadenopathy:    He has no cervical adenopathy.  Neurological: He is alert and oriented to person, place, and time. He has normal reflexes. He exhibits normal muscle tone. Coordination normal.  Skin: Skin is warm and dry. He is not diaphoretic. No erythema. No pallor.  Psychiatric: He has a normal mood and affect. His behavior is normal. Judgment and thought content normal.          Assessment & Plan:    HIV disease: continue DTG, 3TC via ACTG trial and then when done with the trial start Tivicay with Descovy for regimen with THREE fully active drugs.    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 REACTIVE (11/19 1032) and he NEEDS a repeat RPR done with next blood draw .  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

## 2015-09-01 ENCOUNTER — Telehealth: Payer: Self-pay | Admitting: *Deleted

## 2015-09-01 NOTE — Telephone Encounter (Signed)
Patient's viagra is here.  #30  NDC [id number] [id number] Exp 03/11/20. Pt will come 9/21 to pick it up. Andree Coss, RN

## 2015-09-17 ENCOUNTER — Ambulatory Visit: Payer: Self-pay | Admitting: Internal Medicine

## 2015-09-23 ENCOUNTER — Encounter: Payer: Self-pay | Admitting: Infectious Disease

## 2015-09-23 LAB — HIV-1 RNA QUANT-NO REFLEX-BLD: HIV-1 RNA Viral Load: <40

## 2015-10-04 ENCOUNTER — Encounter: Payer: Self-pay | Admitting: Infectious Disease

## 2015-10-19 ENCOUNTER — Encounter (INDEPENDENT_AMBULATORY_CARE_PROVIDER_SITE_OTHER): Payer: Self-pay | Admitting: *Deleted

## 2015-10-19 VITALS — BP 126/85 | HR 56 | Temp 97.6°F | Resp 16 | Wt 180.0 lb

## 2015-10-19 DIAGNOSIS — Z006 Encounter for examination for normal comparison and control in clinical research program: Secondary | ICD-10-CM

## 2015-10-19 LAB — COMPREHENSIVE METABOLIC PANEL
ALBUMIN: 4.2 g/dL (ref 3.6–5.1)
ALK PHOS: 67 U/L (ref 40–115)
ALT: 14 U/L (ref 9–46)
AST: 22 U/L (ref 10–35)
BILIRUBIN TOTAL: 0.6 mg/dL (ref 0.2–1.2)
BUN: 8 mg/dL (ref 7–25)
CALCIUM: 9 mg/dL (ref 8.6–10.3)
CO2: 24 mmol/L (ref 20–31)
Chloride: 103 mmol/L (ref 98–110)
Creat: 0.83 mg/dL (ref 0.70–1.25)
Glucose, Bld: 91 mg/dL (ref 65–99)
POTASSIUM: 4.5 mmol/L (ref 3.5–5.3)
Sodium: 138 mmol/L (ref 135–146)
TOTAL PROTEIN: 6.7 g/dL (ref 6.1–8.1)

## 2015-10-19 LAB — CD4/CD8 (T-HELPER/T-SUPPRESSOR CELL)
CD4%: 40.6
CD4: 1056
CD8 T CELL SUPPRESSOR: 36.5
CD8: 949

## 2015-10-19 LAB — POCT URINALYSIS DIPSTICK
BILIRUBIN UA: NEGATIVE
GLUCOSE UA: NEGATIVE
Ketones, UA: NEGATIVE
LEUKOCYTES UA: NEGATIVE
NITRITE UA: NEGATIVE
PH UA: 5.5
Protein, UA: NEGATIVE
RBC UA: NEGATIVE
Spec Grav, UA: <=1.005
UROBILINOGEN UA: 0.2

## 2015-10-19 LAB — LIPID PANEL
CHOL/HDL RATIO: 2.4 ratio (ref <=5.0)
CHOLESTEROL: 123 mg/dL — AB (ref 125–200)
HDL: 51 mg/dL (ref >=40)
LDL Cholesterol: 62 mg/dL (ref <130)
Triglycerides: 51 mg/dL (ref <150)
VLDL: 10 mg/dL (ref <30)

## 2015-10-19 LAB — BILIRUBIN,DIRECT & INDIRECT (FRACTIONATED)
BILIRUBIN DIRECT: 0.2 mg/dL (ref <=0.2)
Indirect Bilirubin: 0.4 mg/dL (ref 0.2–1.2)

## 2015-10-19 LAB — HIV-1 RNA QUANT-NO REFLEX-BLD: HIV-1 RNA Viral Load: <40

## 2015-10-19 MED ORDER — DOLUTEGRAVIR SODIUM 50 MG PO TABS: 50.0000 mg | ORAL_TABLET | Freq: Every day | ORAL | Status: DC

## 2015-10-19 MED ORDER — EMTRICITABINE-TENOFOVIR AF 200-25 MG PO TABS: 1.0000 | ORAL_TABLET | Freq: Every day | ORAL | Status: DC

## 2015-10-19 NOTE — Progress Notes (Signed)
Arthur [name] is here for his week 48 visit for A5353, a Study to Evaluate Dolutegravir and lamivudine Dual Therapy for the Treatment of Naive HIV-1 Infected Participants and month 4 visit for A5332 - Randomized Trial to prevent Vascular Events in HIV (REPRIEVE study).  Med: Pitavastatin/Placebo 4mg  PO Daily  He denies any new complaints but does continue to lose weight. He has lost 15 lbs since he started the study back in December 2015. He says he eats well but has been very active, so doesn't understand why he keeps losing. Dr. Daiva EvesVan Dam [facility] and Descovy once he is off study in 4 weeks. He is scheduled for followup on December 5th for A5353 and March 6th for Reprieve.

## 2015-11-11 ENCOUNTER — Encounter: Payer: Self-pay | Admitting: Infectious Disease

## 2015-11-16 ENCOUNTER — Encounter (INDEPENDENT_AMBULATORY_CARE_PROVIDER_SITE_OTHER): Payer: Self-pay | Admitting: *Deleted

## 2015-11-16 VITALS — BP 112/84 | HR 67 | Temp 98.4°F | Resp 20 | Wt 170.5 lb

## 2015-11-16 DIAGNOSIS — Z006 Encounter for examination for normal comparison and control in clinical research program: Secondary | ICD-10-CM

## 2015-11-16 LAB — CBC WITH DIFFERENTIAL/PLATELET
BASOS PCT: 0 % (ref 0–1)
Basophils Absolute: 0 10*3/uL (ref 0.0–0.1)
EOS ABS: 0 10*3/uL (ref 0.0–0.7)
Eosinophils Relative: 0 % (ref 0–5)
HCT: 46.2 % (ref 39.0–52.0)
HEMOGLOBIN: 15.7 g/dL (ref 13.0–17.0)
Lymphocytes Relative: 33 % (ref 12–46)
Lymphs Abs: 2.8 10*3/uL (ref 0.7–4.0)
MCH: 32 pg (ref 26.0–34.0)
MCHC: 34 g/dL (ref 30.0–36.0)
MCV: 94.1 fL (ref 78.0–100.0)
MONO ABS: 0.8 10*3/uL (ref 0.1–1.0)
MPV: 9.5 fL (ref 8.6–12.4)
Monocytes Relative: 9 % (ref 3–12)
NEUTROS ABS: 4.9 10*3/uL (ref 1.7–7.7)
NEUTROS PCT: 58 % (ref 43–77)
PLATELETS: 204 10*3/uL (ref 150–400)
RBC: 4.91 MIL/uL (ref 4.22–5.81)
RDW: 13.3 % (ref 11.5–15.5)
WBC: 8.4 10*3/uL (ref 4.0–10.5)

## 2015-11-16 LAB — COMPREHENSIVE METABOLIC PANEL
ALBUMIN: 4.6 g/dL (ref 3.6–5.1)
ALT: 12 U/L (ref 9–46)
AST: 20 U/L (ref 10–35)
Alkaline Phosphatase: 69 U/L (ref 40–115)
BUN: 10 mg/dL (ref 7–25)
CALCIUM: 9.3 mg/dL (ref 8.6–10.3)
CHLORIDE: 97 mmol/L — AB (ref 98–110)
CO2: 25 mmol/L (ref 20–31)
Creat: 1.15 mg/dL (ref 0.70–1.25)
Glucose, Bld: 85 mg/dL (ref 65–99)
POTASSIUM: 4.5 mmol/L (ref 3.5–5.3)
SODIUM: 135 mmol/L (ref 135–146)
TOTAL PROTEIN: 7.4 g/dL (ref 6.1–8.1)
Total Bilirubin: 0.6 mg/dL (ref 0.2–1.2)

## 2015-11-16 LAB — PHOSPHORUS: PHOSPHORUS: 3.1 mg/dL (ref 2.5–4.5)

## 2015-11-16 LAB — BILIRUBIN, DIRECT: BILIRUBIN DIRECT: 0.1 mg/dL (ref <=0.2)

## 2015-11-16 NOTE — Progress Notes (Signed)
Arthur [name] is here for his final study visit Week 52 for [facility] Dual Therapy for the treatment of naive HIV-1 Infected Participants. Complaining of mild cold symptoms for the past few days. Concerned that he is continuing to lose weight. Encouraged him to keep snacks with him and to eat frequent meals. Also encouraged milk shakes for increase calorie intake. States that other than the mild cold symptoms he is feeling good. States that he continues to be very active. He has received his new scripts/medications from Dr. Daiva EvesVan Dam and will begin his new regimen of Tivicay and Descovy tomorrow (11/17/15).

## 2015-11-17 ENCOUNTER — Ambulatory Visit
Admission: RE | Admit: 2015-11-17 | Discharge: 2015-11-17 | Disposition: A | Discharge status: Home / Self Care | Payer: Self-pay | Source: Ambulatory Visit | Attending: Infectious Disease | Admitting: Infectious Disease

## 2015-11-17 ENCOUNTER — Encounter: Payer: Self-pay | Admitting: Infectious Disease

## 2015-11-17 ENCOUNTER — Ambulatory Visit (INDEPENDENT_AMBULATORY_CARE_PROVIDER_SITE_OTHER): Payer: Self-pay | Admitting: Infectious Disease

## 2015-11-17 ENCOUNTER — Telehealth: Payer: Self-pay | Admitting: *Deleted

## 2015-11-17 VITALS — BP 117/84 | HR 77 | Temp 98.3°F | Ht 69.0 in | Wt 169.0 lb

## 2015-11-17 DIAGNOSIS — A529 Late syphilis, unspecified: Secondary | ICD-10-CM

## 2015-11-17 DIAGNOSIS — B20 Human immunodeficiency virus [HIV] disease: Secondary | ICD-10-CM

## 2015-11-17 DIAGNOSIS — R059 Cough, unspecified: Secondary | ICD-10-CM

## 2015-11-17 DIAGNOSIS — F319 Bipolar disorder, unspecified: Secondary | ICD-10-CM

## 2015-11-17 DIAGNOSIS — R918 Other nonspecific abnormal finding of lung field: Secondary | ICD-10-CM

## 2015-11-17 DIAGNOSIS — R634 Abnormal weight loss: Secondary | ICD-10-CM

## 2015-11-17 DIAGNOSIS — F1011 Alcohol abuse, in remission: Secondary | ICD-10-CM

## 2015-11-17 DIAGNOSIS — R509 Fever, unspecified: Secondary | ICD-10-CM

## 2015-11-17 DIAGNOSIS — F1010 Alcohol abuse, uncomplicated: Secondary | ICD-10-CM

## 2015-11-17 DIAGNOSIS — R05 Cough: Secondary | ICD-10-CM

## 2015-11-17 MED ORDER — AZITHROMYCIN 250 MG PO TABS: ORAL_TABLET | ORAL | Status: DC

## 2015-11-17 NOTE — Telephone Encounter (Signed)
Per Dr. Daiva EvesVan Dam, patient needs referral to GI for weight loss. Patient has an orange card, but this will expire 12/13.  Patient will contact this RN when he has renewed it. [facility] Dr. Alvis LemmingsBill Austin.  Will contact them for records to complete GI referral. Andree CossHowell, Mai Longnecker M, RN

## 2015-11-17 NOTE — Telephone Encounter (Signed)
Arthur [name] called and said he is feeling very weak, no energy, concerned about the fact that he is losing more weight. Has a cold the past week and voiding excessively. Wants to be checked out. Had labs yesterday at the research visit and they looked normal. Will schedule him to see Dr. Daiva EvesVan Dam this afternoon.

## 2015-11-17 NOTE — Progress Notes (Signed)
Chief complaint: cough, fever and weight loss of 10# in a week greater than 20 in past year  Subjective:    Patient ID: Arthur [name], male    DOB: 1956/01/14, 59 y.o.   MRN: [id number]  HPI   59 year old with HIV diagnosed a little more than one year ago.  He has history of bipolar disorder and alcohol abuse in remission.   He has been enrolled into  ACTG study and has been on  Dolutegravir and Lamivudine (dual therapy) and is nearing the end of the study this December at which point in time would plan on going to [place] and Descovy.    He has an undetectable viral load and healthy CD4 count.  HIV <40  Lab Results  Component Value Date   CD4TABS 1056 10/19/2015   CD4TABS 744 06/18/2015   CD4TABS 880 05/12/2015    Arthur [name] is currently heading up our efforts at Peer Counseling program.  Arthur [name] came in today with several fairly acute complaints.  He has onset of severe cough with fevers and productive cough, malaise, poor appetite, feeling out of breath easily and with 10# weight  Loss during this time. HE has lost nearly 20# since last year. He is ingesting no alcohol and that reduced caloric intake and increased healthy activities could have contributed to some of this weight loss. He had a colonoscopy at [facility] 5 years ago that was apparently with some "precancerous pollyps."   Past Medical History  Diagnosis Date  . History of blood transfusion 12-12-1998  . Bipolar disorder (HCC)   . Depression   . HIV infection (HCC)   . Late syphilis     Past Surgical History  Procedure Laterality Date  . Gastric bypass  2012    [facility]  . Broken right leg Right 2000  . Fracture surgery      Family History  Problem Relation Age of Onset  . Heart disease Mother   . Stroke Mother   . Heart disease Father       Social History   Social History  . Marital Status: Single    Spouse Name: N/A  . Number of Children: N/A  . Years of Education: N/A   Social History Main Topics  .  Smoking status: Never Smoker   . Smokeless tobacco: Never Used  . Alcohol Use: No     Comment: quit 01/15/15  . Drug Use: No  . Sexual Activity:    Partners: Male   Other Topics Concern  . None   Social History Narrative    No Known Allergies   Current outpatient prescriptions:  .  azithromycin (ZITHROMAX) 250 MG tablet, Take 2 tabs first day, then one daily, Disp: 6 each, Rfl: 1 .  buPROPion (WELLBUTRIN) 100 MG tablet, Take 1 tablet (100 mg total) by mouth 3 (three) times daily., Disp: 90 tablet, Rfl: 11 .  dolutegravir (TIVICAY) 50 MG tablet, Take 1 tablet (50 mg total) by mouth daily. Patient will start new meds in December when he comes off study. He has ADAP, Disp: 30 tablet, Rfl: 11 .  emtricitabine-tenofovir AF (DESCOVY) 200-25 MG tablet, Take 1 tablet by mouth daily. Will start once study ends in December, Disp: 30 tablet, Rfl: 11 .  naproxen (NAPROSYN) 375 MG tablet, Take 375 mg by mouth 2 (two) times daily with a meal., Disp: , Rfl:  .  Pitavastatin Calcium 4 MG TABS, Take 1 tablet by mouth daily. This may be placebo and is study  provided. Do not dispense., Disp: , Rfl:  .  sildenafil (VIAGRA) 100 MG tablet, Take 1 tablet (100 mg total) by mouth daily as needed for erectile dysfunction. Come through patient assistance, Disp: 10 tablet, Rfl: 2   Review of Systems  Constitutional: Positive for chills, activity change, appetite change, fatigue and unexpected weight change. Negative for fever and diaphoresis.  HENT: Positive for sinus pressure. Negative for congestion, rhinorrhea, sneezing, sore throat and trouble swallowing.   Eyes: Negative for photophobia and visual disturbance.  Respiratory: Positive for cough and shortness of breath. Negative for chest tightness, wheezing and stridor.   Cardiovascular: Negative for chest pain, palpitations and leg swelling.  Gastrointestinal: Negative for nausea, vomiting, abdominal pain, diarrhea, constipation, blood in stool, abdominal  distention and anal bleeding.  Genitourinary: Negative for dysuria, hematuria, flank pain and difficulty urinating.  Musculoskeletal: Negative for myalgias, back pain, joint swelling, arthralgias and gait problem.  Skin: Negative for color change, pallor, rash and wound.  Neurological: Negative for dizziness, tremors, weakness and light-headedness.  Hematological: Negative for adenopathy. Does not bruise/bleed easily.  Psychiatric/Behavioral: Negative for behavioral problems, confusion, sleep disturbance, dysphoric mood, decreased concentration and agitation.       Objective:   Physical Exam  Constitutional: He is oriented to person, place, and time. He appears well-developed and well-nourished. No distress.  HENT:  Head: Normocephalic and atraumatic.  Mouth/Throat: Oropharynx is clear and moist. No oropharyngeal exudate.  Eyes: Conjunctivae and EOM are normal. Pupils are equal, round, and reactive to light. No scleral icterus.  Neck: Normal range of motion. Neck supple. No JVD present.  Cardiovascular: Normal rate, regular rhythm and normal heart sounds.  Exam reveals no gallop and no friction rub.   No murmur heard. Pulmonary/Chest: Effort normal and breath sounds normal. No respiratory distress. He has no wheezes. He has no rales. He exhibits no tenderness.  Abdominal: Soft. He exhibits no distension and no mass. There is no tenderness. There is no rebound and no guarding.  Musculoskeletal: Normal range of motion. He exhibits no edema or tenderness.  Lymphadenopathy:    He has no cervical adenopathy.  Neurological: He is alert and oriented to person, place, and time. He has normal reflexes. He exhibits normal muscle tone. Coordination normal.  Skin: Skin is warm and dry. No rash noted. He is not diaphoretic. No erythema. No pallor.  Psychiatric: His behavior is normal. Judgment and thought content normal. He exhibits a depressed mood.          Assessment & Plan:   Cough with  fever and weight loss: I was underwhlemed by his CXR but will give him 5 day course of azithromycin in case this is a bacterial infection  Chronic weight loss: could be due to his abstinence from etoh in part. Would like to get him a colonoscopy even if he regains the weight as he is due for endoscopy.  Will consider other more aggressive studies such as CT chest and abdomen and pelvis   HIV disease: continue DTG, 3TC via ACTG trial and then when done with the trial start Tivicay with Descovy for regimen with THREE fully active drugs.    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   and he NEEDS a repeat RPR done with next blood draw .  Need for hep A vaccine: still needs hep A vax #2  Bipolar disorder: on wellbutrin to  tid   Alcoholism in remission: in AA and counseling, giving back  I spent greater than  40  minutes with the patient re various ARV regimens to switch to once he is done with ACTG a and including greater than 50% of time in face to face counsel of the patient and in coordination of his care.

## 2015-11-24 ENCOUNTER — Encounter: Payer: Self-pay | Admitting: Infectious Disease

## 2015-11-25 ENCOUNTER — Encounter: Payer: Self-pay | Admitting: Infectious Disease

## 2015-11-25 ENCOUNTER — Ambulatory Visit (INDEPENDENT_AMBULATORY_CARE_PROVIDER_SITE_OTHER): Payer: Self-pay | Admitting: Infectious Disease

## 2015-11-25 ENCOUNTER — Ambulatory Visit: Payer: Self-pay | Admitting: *Deleted

## 2015-11-25 VITALS — BP 114/72 | HR 68 | Ht 69.0 in | Wt 175.2 lb

## 2015-11-25 DIAGNOSIS — M545 Low back pain, unspecified: Secondary | ICD-10-CM

## 2015-11-25 DIAGNOSIS — F32A Depression, unspecified: Secondary | ICD-10-CM

## 2015-11-25 DIAGNOSIS — R300 Dysuria: Secondary | ICD-10-CM

## 2015-11-25 DIAGNOSIS — Z113 Encounter for screening for infections with a predominantly sexual mode of transmission: Secondary | ICD-10-CM

## 2015-11-25 DIAGNOSIS — R05 Cough: Secondary | ICD-10-CM

## 2015-11-25 DIAGNOSIS — B20 Human immunodeficiency virus [HIV] disease: Secondary | ICD-10-CM

## 2015-11-25 DIAGNOSIS — R109 Unspecified abdominal pain: Secondary | ICD-10-CM

## 2015-11-25 DIAGNOSIS — R059 Cough, unspecified: Secondary | ICD-10-CM

## 2015-11-25 DIAGNOSIS — A529 Late syphilis, unspecified: Secondary | ICD-10-CM

## 2015-11-25 DIAGNOSIS — A09 Infectious gastroenteritis and colitis, unspecified: Secondary | ICD-10-CM

## 2015-11-25 DIAGNOSIS — R197 Diarrhea, unspecified: Secondary | ICD-10-CM | POA: Insufficient documentation

## 2015-11-25 DIAGNOSIS — R634 Abnormal weight loss: Secondary | ICD-10-CM

## 2015-11-25 DIAGNOSIS — F329 Major depressive disorder, single episode, unspecified: Secondary | ICD-10-CM

## 2015-11-25 HISTORY — DX: Low back pain, unspecified: M54.50

## 2015-11-25 LAB — COMPLETE METABOLIC PANEL WITH GFR
ALT: 10 U/L (ref 9–46)
AST: 21 U/L (ref 10–35)
Albumin: 3.9 g/dL (ref 3.6–5.1)
Alkaline Phosphatase: 68 U/L (ref 40–115)
BILIRUBIN TOTAL: 0.3 mg/dL (ref 0.2–1.2)
BUN: 10 mg/dL (ref 7–25)
CO2: 28 mmol/L (ref 20–31)
Calcium: 8.8 mg/dL (ref 8.6–10.3)
Chloride: 102 mmol/L (ref 98–110)
Creat: 0.97 mg/dL (ref 0.70–1.25)
GFR, EST NON AFRICAN AMERICAN: 84 mL/min (ref >=60)
Glucose, Bld: 80 mg/dL (ref 65–99)
Potassium: 4.4 mmol/L (ref 3.5–5.3)
Sodium: 138 mmol/L (ref 135–146)
TOTAL PROTEIN: 6.5 g/dL (ref 6.1–8.1)

## 2015-11-25 LAB — CBC WITH DIFFERENTIAL/PLATELET
Basophils Absolute: 0 10*3/uL (ref 0.0–0.1)
Basophils Relative: 0 % (ref 0–1)
EOS PCT: 2 % (ref 0–5)
Eosinophils Absolute: 0.1 10*3/uL (ref 0.0–0.7)
HEMATOCRIT: 39.5 % (ref 39.0–52.0)
Hemoglobin: 13.3 g/dL (ref 13.0–17.0)
LYMPHS ABS: 2.8 10*3/uL (ref 0.7–4.0)
LYMPHS PCT: 44 % (ref 12–46)
MCH: 31.6 pg (ref 26.0–34.0)
MCHC: 33.7 g/dL (ref 30.0–36.0)
MCV: 93.8 fL (ref 78.0–100.0)
MONO ABS: 0.7 10*3/uL (ref 0.1–1.0)
MPV: 8.5 fL — ABNORMAL LOW (ref 8.6–12.4)
Monocytes Relative: 11 % (ref 3–12)
Neutro Abs: 2.8 10*3/uL (ref 1.7–7.7)
Neutrophils Relative %: 43 % (ref 43–77)
PLATELETS: 445 10*3/uL — AB (ref 150–400)
RBC: 4.21 MIL/uL — ABNORMAL LOW (ref 4.22–5.81)
RDW: 13.9 % (ref 11.5–15.5)
WBC: 6.4 10*3/uL (ref 4.0–10.5)

## 2015-11-25 LAB — AMYLASE: AMYLASE: 32 U/L (ref 0–105)

## 2015-11-25 LAB — LIPASE: Lipase: 48 U/L (ref 7–60)

## 2015-11-25 NOTE — Patient Instructions (Signed)
Overbook in one week with ID clinic MD

## 2015-11-25 NOTE — Progress Notes (Signed)
Chief complaints: left sided lower back pain, dysuria, diarrhea with 6 BM per day with green colored bad smelling stools  Subjective:    Patient ID: Arthur [name], male    DOB: [birth year], 59 y.o.   MRN: [id number]  Diarrhea  This is a new problem. The current episode started in the past 7 days. The problem occurs 5 to 10 times per day. The problem has been gradually worsening. The stool consistency is described as watery. The patient states that diarrhea does not awaken him from sleep. Pertinent negatives include no abdominal pain, arthralgias, chills, coughing, fever, myalgias or vomiting. Risk factors include recent antibiotic use. He has tried increased fluids for the symptoms. The treatment provided no relief.    59 year old with HIV diagnosed a little more than one year ago.  He has history of bipolar disorder and alcohol abuse in remission.   He has been enrolled into  ACTG study and has been on  Dolutegravir and Lamivudine (dual therapy) and is nearing the end of the study this December at which point in time would plan on going to [place] and Des[place].    He has an undetectable viral load and healthy CD4 count.  HIV <40  Lab Results  Component Value Date   CD4TABS 1056 10/19/2015   CD4TABS 744 06/18/2015   CD4TABS 880 05/12/2015    [name] is currently heading up our efforts at Peer Counseling program.  [name] came in one week ago  with several fairly acute complaints.  He has onset of severe cough with fevers and productive cough, malaise, poor appetite, feeling out of breath easily and with 10# weight  Loss during this time. HE has lost nearly 20# since last year. He is ingesting no alcohol and that reduced caloric intake and increased healthy activities could have contributed to some of this weight loss. He had a colonoscopy at [facility] 5 years ago that was apparently with some "precancerous pollyps."  We checked CXR and I gave him rx for azithromycin which he took. His respiratory  symptoms improved and his fevers subsided and he regained some weight.   Since then however he experienced multiple new complaints including loose green colored stools that "smell awful" x several days also left sided lower back, flank pain, dysuria, cramping sensation in lower abdomen.  He has been sexually active in the past few weeks but no receptive anal intercourse for past 2 months. No well water, no sick contacts. He has procured the orange card.   Past Medical History  Diagnosis Date  . History of blood transfusion 12-12-1998  . Bipolar disorder (HCC)   . Depression   . HIV infection (HCC)   . Late syphilis   . Fever, unspecified 11/17/2015  . Cough 11/17/2015  . Diarrhea 11/25/2015    Past Surgical History  Procedure Laterality Date  . Gastric bypass  2012    [facility]  . Broken right leg Right 2000  . Fracture surgery      Family History  Problem Relation Age of Onset  . Heart disease Mother   . Stroke Mother   . Heart disease Father       Social History   Social History  . Marital Status: Single    Spouse Name: N/A  . Number of Children: N/A  . Years of Education: N/A   Social History Main Topics  . Smoking status: Never Smoker   . Smokeless tobacco: Never Used  . Alcohol Use: No  Comment: quit 01/15/15  . Drug Use: No  . Sexual Activity:    Partners: Male   Other Topics Concern  . Not on file   Social History Narrative    No Known Allergies   Current outpatient prescriptions:  .  azithromycin (ZITHROMAX) 250 MG tablet, Take 2 tabs first day, then one daily, Disp: 6 each, Rfl: 1 .  buPROPion (WELLBUTRIN) 100 MG tablet, Take 1 tablet (100 mg total) by mouth 3 (three) times daily., Disp: 90 tablet, Rfl: 11 .  dolutegravir (TIVICAY) 50 MG tablet, Take 1 tablet (50 mg total) by mouth daily. Patient will start new meds in December when he comes off study. He has ADAP, Disp: 30 tablet, Rfl: 11 .  emtricitabine-tenofovir AF (DESCOVY) 200-25 MG tablet, Take  1 tablet by mouth daily. Will start once study ends in December, Disp: 30 tablet, Rfl: 11 .  naproxen (NAPROSYN) 375 MG tablet, Take 375 mg by mouth 2 (two) times daily with a meal., Disp: , Rfl:  .  Pitavastatin Calcium 4 MG TABS, Take 1 tablet by mouth daily. This may be placebo and is study provided. Do not dispense., Disp: , Rfl:  .  sildenafil (VIAGRA) 100 MG tablet, Take 1 tablet (100 mg total) by mouth daily as needed for erectile dysfunction. Come through patient assistance, Disp: 10 tablet, Rfl: 2   Review of Systems  Constitutional: Positive for activity change, appetite change, fatigue and unexpected weight change. Negative for fever, chills and diaphoresis.  HENT: Negative for congestion, rhinorrhea, sinus pressure, sneezing, sore throat and trouble swallowing.   Eyes: Negative for photophobia and visual disturbance.  Respiratory: Negative for cough, chest tightness, shortness of breath, wheezing and stridor.   Cardiovascular: Negative for chest pain, palpitations and leg swelling.  Gastrointestinal: Positive for diarrhea. Negative for nausea, vomiting, abdominal pain, constipation, blood in stool, abdominal distention and anal bleeding.  Genitourinary: Positive for dysuria and flank pain. Negative for hematuria, discharge and difficulty urinating.  Musculoskeletal: Positive for back pain. Negative for myalgias, joint swelling, arthralgias and gait problem.  Skin: Negative for color change, pallor, rash and wound.  Neurological: Negative for dizziness, tremors, weakness and light-headedness.  Hematological: Negative for adenopathy. Does not bruise/bleed easily.  Psychiatric/Behavioral: Negative for behavioral problems, confusion, sleep disturbance, dysphoric mood, decreased concentration and agitation.       Objective:   Physical Exam  Constitutional: He is oriented to person, place, and time. He appears well-developed and well-nourished. No distress.  HENT:  Head: Normocephalic  and atraumatic.  Mouth/Throat: Oropharynx is clear and moist. No oropharyngeal exudate.  Eyes: Conjunctivae and EOM are normal. Pupils are equal, round, and reactive to light. No scleral icterus.  Neck: Normal range of motion. Neck supple. No JVD present.  Cardiovascular: Normal rate, regular rhythm and normal heart sounds.  Exam reveals no gallop and no friction rub.   No murmur heard. Pulmonary/Chest: Effort normal and breath sounds normal. No respiratory distress. He has no wheezes. He has no rales. He exhibits no tenderness.  Abdominal: Soft. Bowel sounds are normal. He exhibits no distension and no mass. There is no tenderness. There is no rebound and no guarding.  Musculoskeletal: Normal range of motion. He exhibits no edema or tenderness.       Left hip: Normal.       Lumbar back: He exhibits normal range of motion, no tenderness and no bony tenderness.  Lymphadenopathy:    He has no cervical adenopathy.  Neurological: He is alert and oriented  to person, place, and time. He has normal reflexes. He exhibits normal muscle tone. Coordination normal.  Skin: Skin is warm and dry. No rash noted. He is not diaphoretic. No erythema. No pallor.  Psychiatric: His behavior is normal. Judgment and thought content normal. He exhibits a depressed mood.          Assessment & Plan:   New complaint of diarrhea: check C difficile. Check giardia on stool. Holding off on stool pathogen panel, check rectal GC and chlamydia  Abdominal pain: check CMP, lipase, amylase GC and chlamydia from urine, rectum and oropharynx  Dysuria: check UA and culture, could he have prostatitis?   Cough with fever: resolved  Back pain: see above workup first, then consider imaging if not resolving. No neuro symptoms.  Chronic weight loss: could be due to his abstinence from etoh in part. Would like to get him a colonoscopy even if he regains the weight as he is due for endoscopy.  Will consider other more  aggressive studies such as CT chest and abdomen and pelvis   HIV disease: continue DTG, 3TC via ACTG trial and then when done with the trial start Tivicay with Descovy for regimen with THREE fully active drugs.    I spent greater than 40 minutes with the patient including greater than 50% of time in face to face counsel of the patient re his diarrhea, flank, back and abdominal pain his dysuria, his cough, his weight loss, his HIV and in coordination of his  care.

## 2015-11-26 ENCOUNTER — Ambulatory Visit: Payer: Self-pay | Admitting: *Deleted

## 2015-11-26 LAB — CYTOLOGY, (ORAL, ANAL, URETHRAL) ANCILLARY ONLY
CHLAMYDIA, DNA PROBE: NEGATIVE
CHLAMYDIA, DNA PROBE: NEGATIVE
NEISSERIA GONORRHEA: NEGATIVE
NEISSERIA GONORRHEA: NEGATIVE

## 2015-11-26 LAB — URINALYSIS, ROUTINE W REFLEX MICROSCOPIC
Bilirubin Urine: NEGATIVE
Glucose, UA: NEGATIVE
HGB URINE DIPSTICK: NEGATIVE
Ketones, ur: NEGATIVE
LEUKOCYTES UA: NEGATIVE
NITRITE: NEGATIVE
PROTEIN: NEGATIVE
Specific Gravity, Urine: 1.012 (ref 1.001–1.035)
pH: 5.5 (ref 5.0–8.0)

## 2015-11-26 LAB — URINE CYTOLOGY ANCILLARY ONLY
CHLAMYDIA, DNA PROBE: NEGATIVE
Neisseria Gonorrhea: NEGATIVE

## 2015-11-26 LAB — URINE CULTURE

## 2015-11-26 LAB — RPR

## 2015-11-26 NOTE — Addendum Note (Signed)
Addended by: Mariea ClontsGREEN, Ahri Olson D on: 11/26/2015 11:31 AM   Modules accepted: Orders

## 2015-11-26 NOTE — BH Specialist Note (Signed)
Counselor met with patient due to some melancholy despondent type mood.  Patient was oriented times four with flat affect.  Patient was not in the best of spirits due to not feeling well physically and experiencing some heightened emotions due to the holidays.  Counselor used reflective listening skills as patient shared openly about his feelings.  Counselor encouraged patient to get the amount of rest he needed as well as pulled back from some of his volunteer responsibilities.  Patient shared that since the article about his HIV diagnosis came out in the [employer], his sister has shunned him as evidenced by not calling like usual and not returning text messages.  Counselor discussed with patient how the holidays came bring on additional depressive type symptoms.  Counselor explained that it's important to manage expectations during the holidays and not hope for things to be perfect. Counselor taught patient that putting pressure on your family to all get along or to be cheerful could lead to disappointment and additional anxiety. Patient was attentive and agreed to be careful about his expectations throughout the holidays and generally when it came to how his family members react to his medical situation. Counselor encouraged patient to lean on his committed natural supports.  Patient agreed to check in with counselor in a week for additional support if needed.  Rolena Infante, MA [facility]

## 2015-11-27 ENCOUNTER — Telehealth: Payer: Self-pay | Admitting: *Deleted

## 2015-11-27 LAB — GIARDIA/CRYPTOSPORIDIUM (EIA)
CRYPTOSPORIDIUM SCREEN (EIA) (SOL): NEGATIVE
Giardia Screen (EIA): NEGATIVE

## 2015-11-27 LAB — C. DIFFICILE GDH AND TOXIN A/B
C. difficile GDH: NOT DETECTED
C. difficile Toxin A/B: NOT DETECTED

## 2015-11-27 NOTE — Telephone Encounter (Signed)
Patient describes improvement today.  Lab results reviewed and shared with the pt.  Overall, he is feeling better today.  He was able to sleep last night and most of today.  RN advised a BRAT diet and described it for him.  The patient believes he will cut down on the amount of diet caffeinated sodas he drinks in a day.  Currently drinking a 3 liter soda/day.  RN agreed that decreasing the amount of these drinks would be helpful.  RN asked the patient to call [facility] on Monday to let us know how he is doing.  Pt stated he would call back.

## 2015-11-28 NOTE — Telephone Encounter (Signed)
Thanks so much Denise 

## 2015-12-15 ENCOUNTER — Encounter: Payer: Self-pay | Admitting: Infectious Disease

## 2015-12-15 ENCOUNTER — Ambulatory Visit (INDEPENDENT_AMBULATORY_CARE_PROVIDER_SITE_OTHER): Payer: Self-pay | Admitting: Infectious Disease

## 2015-12-15 VITALS — BP 139/71 | HR 84 | Temp 98.1°F | Wt 180.0 lb

## 2015-12-15 DIAGNOSIS — R509 Fever, unspecified: Secondary | ICD-10-CM

## 2015-12-15 DIAGNOSIS — F1010 Alcohol abuse, uncomplicated: Secondary | ICD-10-CM

## 2015-12-15 DIAGNOSIS — F319 Bipolar disorder, unspecified: Secondary | ICD-10-CM

## 2015-12-15 DIAGNOSIS — R197 Diarrhea, unspecified: Secondary | ICD-10-CM

## 2015-12-15 DIAGNOSIS — R39198 Other difficulties with micturition: Secondary | ICD-10-CM

## 2015-12-15 DIAGNOSIS — B20 Human immunodeficiency virus [HIV] disease: Secondary | ICD-10-CM

## 2015-12-15 DIAGNOSIS — M545 Low back pain, unspecified: Secondary | ICD-10-CM

## 2015-12-15 DIAGNOSIS — F1011 Alcohol abuse, in remission: Secondary | ICD-10-CM

## 2015-12-15 DIAGNOSIS — R634 Abnormal weight loss: Secondary | ICD-10-CM

## 2015-12-15 LAB — CBC WITH DIFFERENTIAL/PLATELET
BASOS ABS: 0 10*3/uL (ref 0.0–0.1)
BASOS PCT: 0 % (ref 0–1)
EOS ABS: 0.2 10*3/uL (ref 0.0–0.7)
Eosinophils Relative: 3 % (ref 0–5)
HCT: 38.3 % — ABNORMAL LOW (ref 39.0–52.0)
Hemoglobin: 12.3 g/dL — ABNORMAL LOW (ref 13.0–17.0)
Lymphocytes Relative: 44 % (ref 12–46)
Lymphs Abs: 2.5 10*3/uL (ref 0.7–4.0)
MCH: 30.6 pg (ref 26.0–34.0)
MCHC: 32.1 g/dL (ref 30.0–36.0)
MCV: 95.3 fL (ref 78.0–100.0)
MONOS PCT: 11 % (ref 3–12)
MPV: 9.7 fL (ref 8.6–12.4)
Monocytes Absolute: 0.6 10*3/uL (ref 0.1–1.0)
NEUTROS ABS: 2.4 10*3/uL (ref 1.7–7.7)
NEUTROS PCT: 42 % — AB (ref 43–77)
PLATELETS: 253 10*3/uL (ref 150–400)
RBC: 4.02 MIL/uL — AB (ref 4.22–5.81)
RDW: 14.5 % (ref 11.5–15.5)
WBC: 5.7 10*3/uL (ref 4.0–10.5)

## 2015-12-15 LAB — COMPLETE METABOLIC PANEL WITH GFR
ALT: 11 U/L (ref 9–46)
AST: 17 U/L (ref 10–35)
Albumin: 4.2 g/dL (ref 3.6–5.1)
Alkaline Phosphatase: 66 U/L (ref 40–115)
BILIRUBIN TOTAL: 0.5 mg/dL (ref 0.2–1.2)
BUN: 8 mg/dL (ref 7–25)
CHLORIDE: 108 mmol/L (ref 98–110)
CO2: 24 mmol/L (ref 20–31)
Calcium: 8.7 mg/dL (ref 8.6–10.3)
Creat: 0.83 mg/dL (ref 0.70–1.25)
Glucose, Bld: 36 mg/dL — CL (ref 65–99)
POTASSIUM: 4 mmol/L (ref 3.5–5.3)
Sodium: 140 mmol/L (ref 135–146)
TOTAL PROTEIN: 6.3 g/dL (ref 6.1–8.1)

## 2015-12-15 MED ORDER — TAMSULOSIN HCL 0.4 MG PO CAPS: 0.4000 mg | ORAL_CAPSULE | Freq: Every day | ORAL | Status: DC

## 2015-12-15 NOTE — Progress Notes (Signed)
Chief complaints: followup for HIV on new regimen , also with difficulty emptying bladder  Subjective:    Patient ID: Arthur [name], male    DOB: [birth year], 60 y.o.   MRN: [id number]  HPI  60 year old with HIV diagnosed a little more than one year ago.  He has history of bipolar disorder and alcohol abuse in remission.   He has been enrolled into  ACTG study and had been on  Dolutegravir and Lamivudine (dual therapy) and now changed over to  Norris and Descovy.  He has an undetectable viral load and healthy CD4 count.  HIV <40  Lab Results  Component Value Date   CD4TABS 1056 10/19/2015   CD4TABS 744 06/18/2015   CD4TABS 880 05/12/2015    Arthur [name] is currently heading up our efforts at Peer Counseling program.  Arthur [name] came in several weeks ago  with several fairly acute complaints.  He has onset of severe cough with fevers and productive cough, malaise, poor appetite, feeling out of breath easily and with 10# weight  Loss during this time. HE has lost nearly 20# since last year. He is ingesting no alcohol and that reduced caloric intake and increased healthy activities could have contributed to some of this weight loss. He had a colonoscopy at [facility] 5 years ago that was apparently with some "precancerous pollyps."  We checked CXR and I gave him rx for azithromycin which he took. His respiratory symptoms improved and his fevers subsided and he regained some weight.   Since then however he experienced multiple new complaints including loose green colored stools that "smell awful" x several days also left sided lower back, flank pain, dysuria, cramping sensation in lower abdomen.  He has been sexually active in the past few weeks but no receptive anal intercourse for past 2 months. No well water, no sick contacts. He has procured the orange card. We did thorough workup for his complaints and could not find a treatable pathology. He has had improvement in all of those symptoms and has gained  back 10#. He does have c/o difficulty emptying bladder.    Past Medical History  Diagnosis Date  . History of blood transfusion 12-12-1998  . Bipolar disorder (HCC)   . Depression   . HIV infection (HCC)   . Late syphilis   . Fever, unspecified 11/17/2015  . Cough 11/17/2015  . Diarrhea 11/25/2015  . Flank pain 11/25/2015  . Dysuria 11/25/2015  . Lower back pain 11/25/2015    Past Surgical History  Procedure Laterality Date  . Gastric bypass  2012    [facility]  . Broken right leg Right 2000  . Fracture surgery      Family History  Problem Relation Age of Onset  . Heart disease Mother   . Stroke Mother   . Heart disease Father       Social History   Social History  . Marital Status: Single    Spouse Name: N/A  . Number of Children: N/A  . Years of Education: N/A   Social History Main Topics  . Smoking status: Never Smoker   . Smokeless tobacco: Never Used  . Alcohol Use: No     Comment: quit 01/15/15  . Drug Use: No  . Sexual Activity:    Partners: Male   Other Topics Concern  . None   Social History Narrative    No Known Allergies   Current outpatient prescriptions:  .  buPROPion (WELLBUTRIN) 100 MG tablet, Take 1 tablet (  100 mg total) by mouth 3 (three) times daily., Disp: 90 tablet, Rfl: 11 .  dolutegravir (TIVICAY) 50 MG tablet, Take 1 tablet (50 mg total) by mouth daily. Patient will start new meds in December when he comes off study. He has ADAP, Disp: 30 tablet, Rfl: 11 .  emtricitabine-tenofovir AF (DESCOVY) 200-25 MG tablet, Take 1 tablet by mouth daily. Will start once study ends in December, Disp: 30 tablet, Rfl: 11 .  naproxen (NAPROSYN) 375 MG tablet, Take 375 mg by mouth 2 (two) times daily with a meal., Disp: , Rfl:  .  Pitavastatin Calcium 4 MG TABS, Take 1 tablet by mouth daily. This may be placebo and is study provided. Do not dispense., Disp: , Rfl:  .  sildenafil (VIAGRA) 100 MG tablet, Take 1 tablet (100 mg total) by mouth daily as needed for  erectile dysfunction. Come through patient assistance, Disp: 10 tablet, Rfl: 2 .  tamsulosin (FLOMAX) 0.4 MG CAPS capsule, Take 1 capsule (0.4 mg total) by mouth daily after supper., Disp: 30 capsule, Rfl: 11   Review of Systems  Constitutional: Negative for diaphoresis, activity change, appetite change, fatigue and unexpected weight change.  HENT: Negative for congestion, rhinorrhea, sinus pressure, sneezing, sore throat and trouble swallowing.   Eyes: Negative for photophobia and visual disturbance.  Respiratory: Negative for chest tightness, shortness of breath, wheezing and stridor.   Cardiovascular: Negative for chest pain, palpitations and leg swelling.  Gastrointestinal: Negative for nausea, constipation, blood in stool, abdominal distention and anal bleeding.  Genitourinary: Positive for decreased urine volume. Negative for dysuria, hematuria, flank pain, discharge and difficulty urinating.  Musculoskeletal: Negative for back pain, joint swelling and gait problem.  Skin: Negative for color change, pallor, rash and wound.  Neurological: Negative for dizziness, tremors, weakness and light-headedness.  Hematological: Negative for adenopathy. Does not bruise/bleed easily.  Psychiatric/Behavioral: Negative for behavioral problems, confusion, sleep disturbance, dysphoric mood, decreased concentration and agitation.       Objective:   Physical Exam  Constitutional: He is oriented to person, place, and time. He appears well-developed and well-nourished. No distress.  HENT:  Head: Normocephalic and atraumatic.  Mouth/Throat: Oropharynx is clear and moist. No oropharyngeal exudate.  Eyes: Conjunctivae and EOM are normal. Pupils are equal, round, and reactive to light. No scleral icterus.  Neck: Normal range of motion. Neck supple. No JVD present.  Cardiovascular: Normal rate, regular rhythm and normal heart sounds.  Exam reveals no gallop and no friction rub.   No murmur  heard. Pulmonary/Chest: Effort normal and breath sounds normal. No respiratory distress. He has no wheezes. He has no rales. He exhibits no tenderness.  Abdominal: Soft. Bowel sounds are normal. He exhibits no distension and no mass. There is no tenderness. There is no rebound and no guarding.  Musculoskeletal: Normal range of motion. He exhibits no edema or tenderness.       Left hip: Normal.       Lumbar back: He exhibits normal range of motion, no tenderness and no bony tenderness.  Lymphadenopathy:    He has no cervical adenopathy.  Neurological: He is alert and oriented to person, place, and time. He has normal reflexes. He exhibits normal muscle tone. Coordination normal.  Skin: Skin is warm and dry. No rash noted. He is not diaphoretic. No erythema. No pallor.  Psychiatric: His behavior is normal. Judgment and thought content normal. He exhibits a depressed mood.          Assessment & Plan:  Difficulty emptying bladder and we had worried about prostatitis before but cultures negative. Will try flomax for possible BPH. Consider also therapeutic trial of abx for prostatitis if not improving quickly  Abdominal pain: resolved   Cough with fever: resolved  Back pain: resolved  Chronic weight loss: improved but  Would like to get him a colonoscopy since he is also due for one   HIV disease: continue  Tivicay with Descovy for regimen with THREE fully active drugs, check VL today and rtc in 6 months   I spent greater than 40 minutes with the patient including greater than 50% of time in face to face counsel of the patient re his  abdominal pain his difficulty emptying his bladder, his cough, his weight loss, his HIV and in coordination of his  care.

## 2015-12-16 ENCOUNTER — Telehealth: Payer: Self-pay | Admitting: *Deleted

## 2015-12-16 DIAGNOSIS — K912 Postsurgical malabsorption, not elsewhere classified: Secondary | ICD-10-CM

## 2015-12-16 LAB — T-HELPER CELL (CD4) - (RCID CLINIC ONLY)
CD4 % Helper T Cell: 35 % (ref 33–55)
CD4 T CELL ABS: 930 /uL (ref 400–2700)

## 2015-12-16 LAB — HIV-1 RNA QUANT-NO REFLEX-BLD: HIV 1 RNA Quant: <20 copies/mL (ref <20)

## 2015-12-16 NOTE — Telephone Encounter (Signed)
[facility] Office note faxed and confirmation received. Patient's orange card is active from 11/25/15 through 05/25/16. Wendall MolaJacqueline Cockerham

## 2015-12-16 NOTE — Telephone Encounter (Signed)
Critical value - Glucose = 36, 12/15/15.  Reported to Dr. Daiva EvesVan Dam.  RN spoke with the pt.  Pt stated that he ate a "big" lunch @ 1245, no breakfast.

## 2015-12-16 NOTE — Telephone Encounter (Signed)
Discussed with Dr. Elvera LennoxGherghe and the patient. It appears he is having a post gastric bypass surgery hypoglycemia due to eating too high carbohydrate high glycemic index diet. I advised to cut high glycemic sugars from diet. He would benefit from seeing a dietician can he see someone using his orange card?

## 2015-12-17 NOTE — Addendum Note (Signed)
Addended by: Jennet MaduroESTRIDGE, DENISE D on: 12/17/2015 10:21 AM   Modules accepted: Orders

## 2015-12-17 NOTE — Telephone Encounter (Signed)
Spoke with the pt about [facility].  Discussed that the referral will be addressed to the Nutritionist who works with Bariatric Surgery patients.

## 2015-12-24 ENCOUNTER — Encounter: Payer: Self-pay | Admitting: Dietician

## 2015-12-24 ENCOUNTER — Encounter: Payer: No Typology Code available for payment source | Attending: Infectious Disease | Admitting: Dietician

## 2015-12-24 DIAGNOSIS — E162 Hypoglycemia, unspecified: Secondary | ICD-10-CM | POA: Insufficient documentation

## 2015-12-24 DIAGNOSIS — Z713 Dietary counseling and surveillance: Secondary | ICD-10-CM | POA: Insufficient documentation

## 2015-12-24 NOTE — Progress Notes (Signed)
Medical Nutrition Therapy:  Appt start time: 1445 end time:  1545.   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  Patient reports that he has [facility] and that on 12/15/15 he had a glucose reading of 36 mg/dL.  He denies having had symptoms of hypoglycemia.  He reports that in the weeks prior he was trying to regain weight that he had lost when he was sick with the flu (illness resulted in wt loss of 14 lbs).  He states that normally he follows a low-carb, high protein diet with some healthy fats but that during the period of wanting to regain weight he began eating many foods that he hasn't eaten in years or that he has very rarely such as sugary treats and desserts and high-starch foods like bread and pasta.  On the day of his severe post-prandial hypoglycemia he reports his eating was "very abnormal" for him and probably the most carb-heavy meal he has had since his surgery. Reports that he skipped breakfast that day and had a very large lunch consisting of pasta and 2 slices cheesecake.  He states the only symptoms of hypoglycemia he can possibly detect were feelings of confusion where he had difficulty reading the clock on the wall and had to look at a digital clock to tell the time.  Since this occurrence he has largely returned back to his normal eating style with the exception of still having more sweet cravings since allowing those foods back into his diet.  He states he is determined to return to his good eating habits.  He expresses concern over the low blood sugar reading on 12/15/15 stating that his usual glucose readings are between 90-100 mg/dL.  He states he would have expected his number to be very high on that day after the meal he consumed.  Checked his blood sugar in the office today, he reports having the first bite of his lunch at 12:30 pm and his BG was 85 mg/dL at 4:093:05 pm during  our appointment.  He was very relieved at this.  For his current diet he states that he focuses on lean meats, and really likes greens, having them at almost every lunch and dinner meal.  If he does have a carbohydrate it is usually boiled potatoes.  He likes fish and chicken, eats red meat rarely.  He has history of alcoholism and will have been sober for one year next month.  He is consuming very large quantities of decaffeinated diet soda which he states came about after he quit drinking alcohol.  Overall he had very good nutrition questions for me today and appears to be motivated to return to his usual eating habits now that he has regained the weight that he was concerned about.  His goal is to maintain his weight and stay healthy.  Preferred Learning Style:  No preference indicated   Learning Readiness:  Ready  Change in progress  MEDICATIONS: see list   DIETARY INTAKE:  Usual eating pattern includes 2-3 meals and 1-2 snacks per day.  Everyday foods include meat like chicken or fish, greens.  Avoided foods include most starchy foods, alcohol, caffeinated drinks.    Dietary recall:  B (8:30 AM): last few months has skipped, used to be an egg with cheese on it  Snk (10-10:30 AM): occasionally oatmeal with splenda L (12-12:15 PM): meat like chicken, pasta, collards, beans,  corn bread Snk ( PM): typically none D (5 PM): piece of fish, green beans, walnuts, 3-4 cherries or blueberries Snk (7 PM): piece of meat Beverages: decaf diet sodas 2-3 liters per day, decaf coffee, sometimes cranberry juice diluted with sugar-free crystal lite (more recently since he has it on hand, will likely stop when it runs out), practically no water he reports  Usual physical activity: walks 3-4 times a week, normally 2-3 miles, wanting to start some light weight lifting  Progress Towards Goal(s):  In progress.   Nutritional Diagnosis:  NB-1.1 Food and nutrition-related knowledge deficit As related to  dietary strategies to reduce post-prandial hypoglycemia.  As evidenced by patient request for dietary recommendations.    Intervention:  Nutrition education and counseling.  Explained reactive hypoglycemia and stated dietary strategies used to prevent post-prandial hypoglycemia.  Reviewed his recent diet changes and outlined how these likely contributed to his low blood sugar.  Listed symptoms of hypoglycemia and explained the Rule of 15 to treat hypoglycemia.  Reviewed dietary recommendations for post-bariatric surgery and encouraged him to return to the healthy eating patterns he usually practices as he has started to do in the last week.  Recommended he gradually increase his water intake while decreasing diet soda intake.  Discussed long-term supplements he is taking and the ones that he has discontinued.  He is being closely monitored for deficiencies, taking B12, iron, and Vitamin D and states that his calcium levels are normal.  Encouraged he start eating breakfast again within 1-2 hours of waking and continue with smaller, more frequent meals instead of 2 large meals per day.  Strongly emphasized the importance of smaller, more frequent meals to get the nutrition he needs and to reduce occurrence of reactive hypoglycemia following large meals.  Reviewed recommended food lists and encouraged continued intake of high fiber foods and portion control of starchy foods when he does have them occasionally.  Praised him for his commitment to his health.  Discussed physical activity and recommended always eating before or after exercise and to start gradually if he plans to increase his activity levels.  Teaching Method Utilized: Visual Auditory Hands on  Handouts given during visit include:  Bariatric Phase 3B High Protein + Nonstarchy vegetables  Bariatric Snack Ideas  Barriers to learning/adherence to lifestyle change: none  Demonstrated degree of understanding via:  Teach Back    Monitoring/Evaluation:  Dietary intake, exercise, labs, and body weight prn.  Gave business card.  Discussed with patient if he has another hypoglycemic reading during his [facility] visits to call our office and make a follow-up appointment at which time we will have him begin checking his blood sugars regularly at home in addition to keeping a food log.  I expect this will be unnecessary if he returns to his normal diet as he reports he is doing.

## 2016-01-12 ENCOUNTER — Telehealth: Payer: Self-pay | Admitting: *Deleted

## 2016-01-12 ENCOUNTER — Other Ambulatory Visit: Payer: Self-pay | Admitting: Internal Medicine

## 2016-01-12 ENCOUNTER — Other Ambulatory Visit: Payer: Self-pay | Admitting: Infectious Disease

## 2016-01-12 NOTE — Telephone Encounter (Signed)
Message from [facility] for [facility] stating there are no providers for a colonoscopy that participates with the orange card. Arthur [name]

## 2016-01-12 NOTE — Telephone Encounter (Signed)
Referral made to [facility] GI clinic and they will call the patient to schedule an appointment. Office note faxed to [phone] and patient given # to the financial aide office [phone].

## 2016-01-12 NOTE — Telephone Encounter (Signed)
.   I suppose we can send [name] to [facility]. Theodoro Grist do you have any thoughts on any other avenues?

## 2016-01-12 NOTE — Telephone Encounter (Signed)
Thanks everyone

## 2016-01-12 NOTE — Telephone Encounter (Signed)
At this moment, Arthur [name] is proceeding with the best option for him because our GI's do not participate in the hospital charity program or the [employer].

## 2016-01-13 ENCOUNTER — Encounter: Payer: Self-pay | Admitting: Infectious Disease

## 2016-02-08 ENCOUNTER — Encounter: Payer: Self-pay | Admitting: *Deleted

## 2016-02-16 ENCOUNTER — Encounter (INDEPENDENT_AMBULATORY_CARE_PROVIDER_SITE_OTHER): Payer: No Typology Code available for payment source | Admitting: *Deleted

## 2016-02-16 VITALS — BP 145/80 | HR 62 | Temp 98.7°F | Resp 16 | Wt 183.8 lb

## 2016-02-16 DIAGNOSIS — Z006 Encounter for examination for normal comparison and control in clinical research program: Secondary | ICD-10-CM

## 2016-02-16 NOTE — Progress Notes (Signed)
Arthur [name] is here for an 8 month visit for Reprieve, A Randomized Trial to Prevent Vascular Events in HIV (study drug is Pitavastatin 4mg  or placebo). He denies any new problems or concerns. Dr. Daiva EvesVan Dam started him on Flomax which he says is helping immensely. He has been very adherent with his meds. We discussed enrolling on the substudy A5362s later this year since he is on the other substudy. He will return in July for the next study visit.

## 2016-02-29 ENCOUNTER — Encounter: Payer: Self-pay | Admitting: Infectious Disease

## 2016-03-01 ENCOUNTER — Other Ambulatory Visit: Payer: Self-pay | Admitting: *Deleted

## 2016-03-01 DIAGNOSIS — B20 Human immunodeficiency virus [HIV] disease: Secondary | ICD-10-CM

## 2016-03-01 MED ORDER — TAMSULOSIN HCL 0.4 MG PO CAPS: 0.8000 mg | ORAL_CAPSULE | Freq: Every day | ORAL | Status: DC

## 2016-03-02 ENCOUNTER — Ambulatory Visit: Payer: No Typology Code available for payment source | Admitting: *Deleted

## 2016-03-02 DIAGNOSIS — F1010 Alcohol abuse, uncomplicated: Secondary | ICD-10-CM

## 2016-03-02 NOTE — BH Specialist Note (Signed)
Counselor met with Arthur [name] today and discussed his sobriety and life updates.  Patient was oriented times four with good affect and dress.  Patient was alert and talkative.  Patient presented with a good attitude and positive spirits.  Counselor inquired with patient as to how his sobriety was going.  Patient indicated very well and still clean.  Patient shared that he did not have any urges to use any more.  Counselor reviewed relapse prevention tools with patient and encouraged patient to continue AA.  Counselor also reviewed common triggers. Patient shared that he was staying busy and productive volunteering and serving as a peer support.  Counselor recommended that patient cut pieces of time out for himself to focus on his goals and life.  Patient agreed that he needed to do more of that.  Counselor provided support and props for staying clean and being productive.   Rolena Infante, MA [facility] Drug [facility]

## 2016-03-24 ENCOUNTER — Other Ambulatory Visit: Payer: Self-pay | Admitting: Infectious Disease

## 2016-03-24 DIAGNOSIS — F32A Depression, unspecified: Secondary | ICD-10-CM

## 2016-03-24 DIAGNOSIS — F329 Major depressive disorder, single episode, unspecified: Secondary | ICD-10-CM

## 2016-04-05 DIAGNOSIS — E162 Hypoglycemia, unspecified: Secondary | ICD-10-CM | POA: Insufficient documentation

## 2016-05-04 ENCOUNTER — Other Ambulatory Visit: Payer: Self-pay | Admitting: *Deleted

## 2016-05-04 DIAGNOSIS — N528 Other male erectile dysfunction: Secondary | ICD-10-CM

## 2016-05-04 MED ORDER — SILDENAFIL CITRATE 100 MG PO TABS: 100.0000 mg | ORAL_TABLET | Freq: Every day | ORAL | Status: DC | PRN

## 2016-05-25 ENCOUNTER — Other Ambulatory Visit: Payer: No Typology Code available for payment source

## 2016-05-25 DIAGNOSIS — B20 Human immunodeficiency virus [HIV] disease: Secondary | ICD-10-CM

## 2016-05-25 LAB — CBC WITH DIFFERENTIAL/PLATELET
BASOS PCT: 0 %
Basophils Absolute: 0 cells/uL (ref 0–200)
EOS PCT: 2 %
Eosinophils Absolute: 130 cells/uL (ref 15–500)
HCT: 43.3 % (ref 38.5–50.0)
Hemoglobin: 13.9 g/dL (ref 13.2–17.1)
LYMPHS ABS: 2990 {cells}/uL (ref 850–3900)
Lymphocytes Relative: 46 %
MCH: 31.4 pg (ref 27.0–33.0)
MCHC: 32.1 g/dL (ref 32.0–36.0)
MCV: 98 fL (ref 80.0–100.0)
MONOS PCT: 9 %
MPV: 9.4 fL (ref 7.5–12.5)
Monocytes Absolute: 585 cells/uL (ref 200–950)
NEUTROS ABS: 2795 {cells}/uL (ref 1500–7800)
Neutrophils Relative %: 43 %
PLATELETS: 246 10*3/uL (ref 140–400)
RBC: 4.42 MIL/uL (ref 4.20–5.80)
RDW: 14 % (ref 11.0–15.0)
WBC: 6.5 10*3/uL (ref 3.8–10.8)

## 2016-05-25 LAB — COMPLETE METABOLIC PANEL WITH GFR
ALT: 13 U/L (ref 9–46)
AST: 20 U/L (ref 10–35)
Albumin: 4.3 g/dL (ref 3.6–5.1)
Alkaline Phosphatase: 67 U/L (ref 40–115)
BILIRUBIN TOTAL: 0.7 mg/dL (ref 0.2–1.2)
BUN: 10 mg/dL (ref 7–25)
CO2: 26 mmol/L (ref 20–31)
CREATININE: 0.89 mg/dL (ref 0.70–1.25)
Calcium: 8.9 mg/dL (ref 8.6–10.3)
Chloride: 102 mmol/L (ref 98–110)
GFR, Est African American: >89 mL/min (ref >=60)
Glucose, Bld: 100 mg/dL — ABNORMAL HIGH (ref 65–99)
Potassium: 4.5 mmol/L (ref 3.5–5.3)
SODIUM: 137 mmol/L (ref 135–146)
TOTAL PROTEIN: 6.9 g/dL (ref 6.1–8.1)

## 2016-05-25 LAB — LIPID PANEL
CHOLESTEROL: 120 mg/dL — AB (ref 125–200)
HDL: 63 mg/dL (ref >=40)
LDL Cholesterol: 47 mg/dL (ref <130)
Total CHOL/HDL Ratio: 1.9 Ratio (ref <=5.0)
Triglycerides: 52 mg/dL (ref <150)
VLDL: 10 mg/dL (ref <30)

## 2016-05-25 NOTE — Addendum Note (Signed)
Addended by: ABBITT, KATRINA F on: 05/25/2016 11:41 AM ° ° Modules accepted: Orders ° °

## 2016-05-26 LAB — HIV-1 RNA QUANT-NO REFLEX-BLD
HIV 1 RNA QUANT: 29 {copies}/mL — AB (ref <20)
HIV-1 RNA QUANT, LOG: 1.46 {Log_copies}/mL — AB (ref <1.30)

## 2016-05-26 LAB — T-HELPER CELL (CD4) - (RCID CLINIC ONLY)
CD4 % Helper T Cell: 30 % — ABNORMAL LOW (ref 33–55)
CD4 T CELL ABS: 960 /uL (ref 400–2700)

## 2016-05-26 LAB — URINE CYTOLOGY ANCILLARY ONLY
Chlamydia: NEGATIVE
NEISSERIA GONORRHEA: NEGATIVE

## 2016-05-26 LAB — RPR

## 2016-05-31 ENCOUNTER — Emergency Department (HOSPITAL_COMMUNITY)
Admission: EM | Admit: 2016-05-31 | Discharge: 2016-05-31 | Disposition: A | Discharge status: Home / Self Care | Payer: Self-pay | Attending: Emergency Medicine | Admitting: Emergency Medicine

## 2016-05-31 ENCOUNTER — Encounter (HOSPITAL_COMMUNITY): Payer: Self-pay | Admitting: Family Medicine

## 2016-05-31 ENCOUNTER — Emergency Department (HOSPITAL_COMMUNITY): Payer: Self-pay

## 2016-05-31 ENCOUNTER — Other Ambulatory Visit: Payer: Self-pay

## 2016-05-31 DIAGNOSIS — G459 Transient cerebral ischemic attack, unspecified: Secondary | ICD-10-CM | POA: Insufficient documentation

## 2016-05-31 DIAGNOSIS — R29810 Facial weakness: Secondary | ICD-10-CM | POA: Insufficient documentation

## 2016-05-31 DIAGNOSIS — R531 Weakness: Secondary | ICD-10-CM | POA: Insufficient documentation

## 2016-05-31 DIAGNOSIS — R200 Anesthesia of skin: Secondary | ICD-10-CM | POA: Insufficient documentation

## 2016-05-31 DIAGNOSIS — R791 Abnormal coagulation profile: Secondary | ICD-10-CM | POA: Insufficient documentation

## 2016-05-31 DIAGNOSIS — Z79899 Other long term (current) drug therapy: Secondary | ICD-10-CM | POA: Insufficient documentation

## 2016-05-31 DIAGNOSIS — F329 Major depressive disorder, single episode, unspecified: Secondary | ICD-10-CM | POA: Insufficient documentation

## 2016-05-31 DIAGNOSIS — R4781 Slurred speech: Secondary | ICD-10-CM | POA: Insufficient documentation

## 2016-05-31 LAB — I-STAT CHEM 8, ED
BUN: 6 mg/dL (ref 6–20)
CREATININE: 0.8 mg/dL (ref 0.61–1.24)
Calcium, Ion: 1.17 mmol/L (ref 1.13–1.30)
Chloride: 105 mmol/L (ref 101–111)
Glucose, Bld: 94 mg/dL (ref 65–99)
HEMATOCRIT: 43 % (ref 39.0–52.0)
HEMOGLOBIN: 14.6 g/dL (ref 13.0–17.0)
POTASSIUM: 4.1 mmol/L (ref 3.5–5.1)
SODIUM: 140 mmol/L (ref 135–145)
TCO2: 23 mmol/L (ref 0–100)

## 2016-05-31 LAB — DIFFERENTIAL
BASOS ABS: 0 10*3/uL (ref 0.0–0.1)
BASOS PCT: 1 %
EOS ABS: 0.1 10*3/uL (ref 0.0–0.7)
Eosinophils Relative: 1 %
Lymphocytes Relative: 41 %
Lymphs Abs: 2.2 10*3/uL (ref 0.7–4.0)
Monocytes Absolute: 0.5 10*3/uL (ref 0.1–1.0)
Monocytes Relative: 9 %
NEUTROS PCT: 48 %
Neutro Abs: 2.7 10*3/uL (ref 1.7–7.7)

## 2016-05-31 LAB — COMPREHENSIVE METABOLIC PANEL
ALBUMIN: 4.1 g/dL (ref 3.5–5.0)
ALT: 17 U/L (ref 17–63)
AST: 25 U/L (ref 15–41)
Alkaline Phosphatase: 65 U/L (ref 38–126)
Anion gap: 7 (ref 5–15)
BUN: 6 mg/dL (ref 6–20)
CHLORIDE: 108 mmol/L (ref 101–111)
CO2: 22 mmol/L (ref 22–32)
Calcium: 9.2 mg/dL (ref 8.9–10.3)
Creatinine, Ser: 0.92 mg/dL (ref 0.61–1.24)
GFR calc Af Amer: >60 mL/min (ref >60)
GFR calc non Af Amer: >60 mL/min (ref >60)
GLUCOSE: 99 mg/dL (ref 65–99)
POTASSIUM: 4 mmol/L (ref 3.5–5.1)
Sodium: 137 mmol/L (ref 135–145)
Total Bilirubin: 0.9 mg/dL (ref 0.3–1.2)
Total Protein: 6.6 g/dL (ref 6.5–8.1)

## 2016-05-31 LAB — I-STAT TROPONIN, ED: TROPONIN I, POC: 0 ng/mL (ref 0.00–0.08)

## 2016-05-31 LAB — PROTIME-INR
INR: 1.03 (ref 0.00–1.49)
PROTHROMBIN TIME: 13.7 s (ref 11.6–15.2)

## 2016-05-31 LAB — CBC
HCT: 40 % (ref 39.0–52.0)
Hemoglobin: 13 g/dL (ref 13.0–17.0)
MCH: 30.5 pg (ref 26.0–34.0)
MCHC: 32.5 g/dL (ref 30.0–36.0)
MCV: 93.9 fL (ref 78.0–100.0)
PLATELETS: 244 10*3/uL (ref 150–400)
RBC: 4.26 MIL/uL (ref 4.22–5.81)
RDW: 13.4 % (ref 11.5–15.5)
WBC: 5.4 10*3/uL (ref 4.0–10.5)

## 2016-05-31 LAB — APTT: aPTT: 31 seconds (ref 24–37)

## 2016-05-31 NOTE — ED Provider Notes (Signed)
CSN: [id number]     Arrival date & time 05/31/16  1129 History   First MD Initiated Contact with Patient 05/31/16 1155     Chief Complaint  Patient presents with  . Facial Droop  . Numbness  PT WAS AT HIS DR'S OFFICE TODAY WHEN HE NOTICED SOME NUMBNESS AND WEAKNESS TO HIS RIGHT FACE.  THE PT SAID THE ID DR THOUGHT HE HAD A RIGHT SIDED FACIAL DROOP.  PT HAD A LITTLE NUMBNESS TO HIS RIGHT ARM AS WELL.  SX STARTED ABOUT 1 HR PTA.   (Consider location/radiation/quality/duration/timing/severity/associated sxs/prior Treatment) The history is provided by the patient.    Past Medical History  Diagnosis Date  . History of blood transfusion 12-12-1998  . Bipolar disorder (HCC)   . Depression   . HIV infection (HCC)   . Late syphilis   . Fever, unspecified 11/17/2015  . Cough 11/17/2015  . Diarrhea 11/25/2015  . Flank pain 11/25/2015  . Dysuria 11/25/2015  . Lower back pain 11/25/2015  . Difficulty voiding 12/15/2015   Past Surgical History  Procedure Laterality Date  . Gastric bypass  2012    [facility]  . Broken right leg Right 2000  . Fracture surgery     Family History  Problem Relation Age of Onset  . Heart disease Mother   . Stroke Mother   . Heart disease Father    Social History  Substance Use Topics  . Smoking status: Never Smoker   . Smokeless tobacco: Never Used  . Alcohol Use: No     Comment: quit 01/15/15    Review of Systems  Neurological: Positive for facial asymmetry and numbness.  All other systems reviewed and are negative.     Allergies  Review of patient's allergies indicates no known allergies.  Home Medications   Prior to Admission medications   Medication Sig Start Date End Date Taking? Authorizing Provider  buPROPion (WELLBUTRIN) 100 MG tablet TAKE 1 TABLET BY MOUTH TWICE DAILY FOR 3 DAYS THEN 1 TABLET BY MOUTH THREE TIMES DAILY THEREAFTER 03/24/16  Yes Randall Hiss, MD  Cholecalciferol (VITAMIN D PO) Take 1 tablet by mouth daily.   Yes Historical  Provider, MD  dolutegravir (TIVICAY) 50 MG tablet Take 1 tablet (50 mg total) by mouth daily. Patient will start new meds in December when he comes off study. He has ADAP 10/19/15  Yes Randall Hiss, MD  emtricitabine-tenofovir AF (DESCOVY) 200-25 MG tablet Take 1 tablet by mouth daily. Will start once study ends in December 10/19/15  Yes Randall Hiss, MD  IRON PO Take 1 tablet by mouth daily.   Yes Historical Provider, MD  naproxen (NAPROSYN) 375 MG tablet Take 375 mg by mouth 2 (two) times daily as needed for mild pain.    Yes Historical Provider, MD  Pitavastatin Calcium 4 MG TABS Take 1 tablet by mouth daily. This may be placebo and is study provided. Do not dispense. 06/19/15  Yes Historical Provider, MD  sildenafil (VIAGRA) 100 MG tablet Take 1 tablet (100 mg total) by mouth daily as needed for erectile dysfunction. Come through patient assistance 05/04/16  Yes Ginnie Smart, MD  tamsulosin (FLOMAX) 0.4 MG CAPS capsule Take 2 capsules (0.8 mg total) by mouth daily after supper. Patient taking differently: Take 0.4 mg by mouth 2 (two) times daily.  03/01/16  Yes Randall Hiss, MD  vitamin B-12 (CYANOCOBALAMIN) 100 MCG tablet Take 100 mcg by mouth daily.   Yes Historical  Provider, MD   BP 129/94 mmHg  Pulse 60  Temp(Src) 98 F (36.7 C) (Oral)  Resp 16  SpO2 98% Physical Exam  Constitutional: He is oriented to person, place, and time. He appears well-developed and well-nourished.  HENT:  Head: Normocephalic and atraumatic.  Right Ear: External ear normal.  Left Ear: External ear normal.  Nose: Nose normal.  Mouth/Throat: Oropharynx is clear and moist.  Eyes: Conjunctivae and EOM are normal. Pupils are equal, round, and reactive to light.  Neck: Normal range of motion. Neck supple.  Cardiovascular: Normal rate, regular rhythm, normal heart sounds and intact distal pulses.   Pulmonary/Chest: Effort normal and breath sounds normal.  Abdominal: Soft. Bowel sounds are  normal.  Musculoskeletal: Normal range of motion.  Neurological: He is alert and oriented to person, place, and time.  FACIAL MUSCLES APPEAR TO MOVE SYMMETRICALLY.  NO OTHER NEURO DEFICITS.  Skin: Skin is warm and dry.  Psychiatric: He has a normal mood and affect. His behavior is normal. Judgment and thought content normal.  Nursing note and vitals reviewed.   ED Course  Procedures (including critical care time) Labs Review Labs Reviewed  PROTIME-INR  APTT  CBC  DIFFERENTIAL  COMPREHENSIVE METABOLIC PANEL  I-STAT TROPOININ, ED  I-STAT CHEM 8, ED    Imaging Review Ct Head Wo Contrast  05/31/2016  CLINICAL DATA:  Right-sided numbness. EXAM: CT HEAD WITHOUT CONTRAST TECHNIQUE: Contiguous axial images were obtained from the base of the skull through the vertex without intravenous contrast. COMPARISON:  None. FINDINGS: Brain: No evidence of acute infarction, hemorrhage, extra-axial collection, ventriculomegaly, or mass effect. Mild brain parenchymal atrophy and microvascular angiopathy is seen. Vascular: No hyperdense vessel or unexpected calcification. Skull: Negative for fracture or focal lesion. Sinuses/Orbits: No acute findings. Other: None. IMPRESSION: No acute intracranial abnormality. Mild brain parenchymal atrophy and chronic microvascular disease. Electronically Signed   By: Ted Mcalpineobrinka  Dimitrova M.D.   On: 05/31/2016 12:29   Mr Brain Wo Contrast  05/31/2016  CLINICAL DATA:  Right facial droop EXAM: MRI HEAD WITHOUT CONTRAST MRA HEAD WITHOUT CONTRAST TECHNIQUE: Multiplanar, multiecho pulse sequences of the brain and surrounding structures were obtained without intravenous contrast. Angiographic images of the head were obtained using MRA technique without contrast. COMPARISON:  None. FINDINGS: MRI HEAD FINDINGS Ventricle size normal.  Mild frontal atrophy. Negative for acute or chronic infarction. Negative for demyelinating disease. Cerebral white matter normal. Basal ganglia and  brainstem normal. Negative for hemorrhage or fluid collection. Negative for mass or edema. Mild mucosal edema paranasal sinuses.  Left mastoid effusion. Pituitary normal in size.  Normal orbit. MRA HEAD FINDINGS Both vertebral arteries patent to the basilar. PICA patent bilaterally. Superior cerebellar and posterior cerebral arteries are normal Cavernous carotid is normal bilaterally. Anterior and middle cerebral arteries are normal bilaterally. Negative for cerebral aneurysm. IMPRESSION: No acute intracranial abnormality.  Normal for age MRI of the brain Negative MRA head Mild mucosal edema in the paranasal sinuses. Left mastoid sinus effusion. Electronically Signed   By: Marlan Palauharles  Clark M.D.   On: 05/31/2016 14:03   Mr Maxine GlennMra Head/brain Wo Cm  05/31/2016  CLINICAL DATA:  Right facial droop EXAM: MRI HEAD WITHOUT CONTRAST MRA HEAD WITHOUT CONTRAST TECHNIQUE: Multiplanar, multiecho pulse sequences of the brain and surrounding structures were obtained without intravenous contrast. Angiographic images of the head were obtained using MRA technique without contrast. COMPARISON:  None. FINDINGS: MRI HEAD FINDINGS Ventricle size normal.  Mild frontal atrophy. Negative for acute or chronic infarction. Negative for demyelinating  disease. Cerebral white matter normal. Basal ganglia and brainstem normal. Negative for hemorrhage or fluid collection. Negative for mass or edema. Mild mucosal edema paranasal sinuses.  Left mastoid effusion. Pituitary normal in size.  Normal orbit. MRA HEAD FINDINGS Both vertebral arteries patent to the basilar. PICA patent bilaterally. Superior cerebellar and posterior cerebral arteries are normal Cavernous carotid is normal bilaterally. Anterior and middle cerebral arteries are normal bilaterally. Negative for cerebral aneurysm. IMPRESSION: No acute intracranial abnormality.  Normal for age MRI of the brain Negative MRA head Mild mucosal edema in the paranasal sinuses. Left mastoid sinus  effusion. Electronically Signed   By: Marlan Palau M.D.   On: 05/31/2016 14:03   I have personally reviewed and evaluated these images and lab results as part of my medical decision-making.   EKG Interpretation None      MDM  SX HAVE RESOLVED.  MRI IS NL.  PT WILL BE D/C'D HOME AND INSTR TO F/U WITH PCP. Final diagnoses:  Facial droop        Jacalyn Lefevre, MD 05/31/16 [phone]

## 2016-05-31 NOTE — ED Notes (Signed)
Pt. Transported to MRI at this time.  

## 2016-05-31 NOTE — ED Notes (Signed)
Pt. Transported to CT at this time.  

## 2016-05-31 NOTE — ED Notes (Signed)
Pt here for numbness and weakness to right side with right sided facial droop. sts unknown when this started. Right grip weaker and slight slurred speech

## 2016-06-08 ENCOUNTER — Ambulatory Visit: Payer: Self-pay | Admitting: Infectious Disease

## 2016-06-09 ENCOUNTER — Other Ambulatory Visit: Payer: Self-pay | Admitting: Infectious Disease

## 2016-06-20 ENCOUNTER — Ambulatory Visit (INDEPENDENT_AMBULATORY_CARE_PROVIDER_SITE_OTHER): Payer: No Typology Code available for payment source | Admitting: Infectious Disease

## 2016-06-20 ENCOUNTER — Encounter: Payer: Self-pay | Admitting: Infectious Disease

## 2016-06-20 VITALS — BP 144/89 | HR 59 | Temp 98.4°F | Ht 70.0 in | Wt 187.0 lb

## 2016-06-20 DIAGNOSIS — E162 Hypoglycemia, unspecified: Secondary | ICD-10-CM

## 2016-06-20 DIAGNOSIS — K912 Postsurgical malabsorption, not elsewhere classified: Secondary | ICD-10-CM | POA: Insufficient documentation

## 2016-06-20 DIAGNOSIS — F329 Major depressive disorder, single episode, unspecified: Secondary | ICD-10-CM

## 2016-06-20 DIAGNOSIS — B20 Human immunodeficiency virus [HIV] disease: Secondary | ICD-10-CM

## 2016-06-20 DIAGNOSIS — I10 Essential (primary) hypertension: Secondary | ICD-10-CM

## 2016-06-20 DIAGNOSIS — F1020 Alcohol dependence, uncomplicated: Secondary | ICD-10-CM

## 2016-06-20 DIAGNOSIS — F333 Major depressive disorder, recurrent, severe with psychotic symptoms: Secondary | ICD-10-CM | POA: Insufficient documentation

## 2016-06-20 DIAGNOSIS — F332 Major depressive disorder, recurrent severe without psychotic features: Secondary | ICD-10-CM

## 2016-06-20 DIAGNOSIS — G458 Other transient cerebral ischemic attacks and related syndromes: Secondary | ICD-10-CM

## 2016-06-20 DIAGNOSIS — A529 Late syphilis, unspecified: Secondary | ICD-10-CM

## 2016-06-20 DIAGNOSIS — F32A Depression, unspecified: Secondary | ICD-10-CM

## 2016-06-20 DIAGNOSIS — F319 Bipolar disorder, unspecified: Secondary | ICD-10-CM

## 2016-06-20 HISTORY — DX: Postsurgical malabsorption, not elsewhere classified: K91.2

## 2016-06-20 HISTORY — DX: Major depressive disorder, recurrent severe without psychotic features: F33.2

## 2016-06-20 MED ORDER — DULOXETINE HCL 30 MG PO CPEP: 30.0000 mg | ORAL_CAPSULE | Freq: Every day | ORAL | Status: DC

## 2016-06-20 NOTE — Patient Instructions (Signed)
appt with Pharmacy in 2 weeks

## 2016-06-20 NOTE — Progress Notes (Signed)
Chief complaints: severe depressive symptoms and relapse of his alcohol abuse  Subjective:    Patient ID: Arthur [name], male    DOB: 1956-03-13, 60 y.o.   MRN: [id number]  HPI   60 year old with HIV diagnosed a little more than one year ago.  He has history of bipolar disorder and alcohol abuse that had been in  remission.   He had been enrolled into  ACTG study and had been on  Dolutegravir and Lamivudine (dual therapy) and now changed over to  [place] and Descovy.  He has an undetectable viral load and healthy CD4 count.  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  Arthur [name] had apparently recently helped a homeless HIV + man by providing him with shelter in [place] [facility]. This was someone whom Arthur [name] had met at "[place]" at [facility]. If I understand the sequence of events Arthur [name] had also engaged the patient in Peer Support.   Regardless apparently that individual had perceived Arthur [name] helping him with a place to sleep (on Arthur [name]'s couch) as expecting sex in return for this. Arthur [name] has been adamant that he never had any such intention and that "nothing could have been further from my mind." He does recognize though that in particular once the individual was engaged in Peer Support with Arthur [name] a program that he is leading for [facility] and [place] that he should not have offered direct shelter since this could lead to risk of perception such as this individual had.   In the same time period, Barb Merino' "Safe Spaces" program that was being held on the property of [facility] though not directly sponsored by [place] attended  by several HIV + patients had come under questio and threat of not being allowed to continue at [facility]. 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 He had sent this to myself, Herschell Dimes, Jamey Ripa our Clinic DIrector, Rolena Infante and a cohort of HIV + peers via a  mass text (that does conceal identity of those on the texts from one another except of Arthur [name] and individual recipient.   The issue of Safe Spaces is being revisited an re-evaluated and Arthur [name] had expressed his desire to return to doing Peer Support work.   In the interim he had also recently suffered from a hospitalization at [facility] for severe hypoglycemia that is partly due to his having undergone gastric bypass surgery and his having not adhered to the strict diet needed with low glycemic index diet. He is now adhering to this diet along with acarbose and being followed at [facility] for this. He also had symptoms c/w TIA but was not found to have had a CVA and was evaluated in [facility] and then in ED.  In context of the above medical and psychosocial stressors and context of Arthur [name] typically over-extending himself (he routinely will do thing such as drive a patient suffering from etoh addtiction to a rehab clinic 3 hours away, go to local government in [place] or to  [place] for advocacy in addition to his work on the [employer]  for the ACTG and his participation in several research trials he suffered severe depressive symptoms and relapse of his alcohol dependence. He began drinking 18 days ago, but quit 4 days ago having gone back to AA (where he had no longer been attending meetings) and received a new "start date tag"  He denies SI or HI but is severely depressed. His relationship with Arthur [name] who sponsored his work in Adult nurse has deteriorated and he has asked her not to text him after an argument re his role in Peer Support currently. He does not wish to be seen by Grayland Ormond but wishes for greater confidentiality  which I completely understand.   We will endeavor to get him seen by Psychiatry and  Psychology for medical management, CBT. He is going to [place] again.       Past Medical History  Diagnosis Date  . History of blood transfusion 12-12-1998  . Bipolar disorder ([place])   . Depression   . HIV infection ([place])   . Late syphilis   . Fever, unspecified 11/17/2015  . Cough 11/17/2015  . Diarrhea 11/25/2015  . Flank pain 11/25/2015  . Dysuria 11/25/2015  . Lower back pain 11/25/2015  . Difficulty voiding 12/15/2015  . 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 Alcoholism ([place]) 06/20/2016  . Recurrent major depression-severe ([place]) 06/20/2016    Past Surgical History  Procedure Laterality Date  . Gastric bypass  2012    [facility]  . Broken right leg Right 2000  . Fracture surgery      Family History  Problem Relation Age of Onset  . Heart disease Mother   . Stroke Mother   . Heart disease Father       Social History   Social History  . Marital Status: Single    Spouse Name: N/A  . Number of Children: N/A  . Years of Education: N/A   Social History Main Topics  . Smoking status: Never Smoker   . Smokeless tobacco: Never Used  . Alcohol Use: No     Comment: quit 01/15/15  . Drug Use: No  . Sexual Activity:    Partners: Male   Other Topics Concern  . None   Social History Narrative    No Known Allergies   Current outpatient prescriptions:  .  buPROPion (WELLBUTRIN) 100 MG tablet, TAKE 1 TABLET BY MOUTH TWICE DAILY FOR 3 DAYS THEN 1 TABLET BY MOUTH THREE TIMES DAILY THEREAFTER, Disp: 90 tablet, Rfl: 2 .  Cholecalciferol (VITAMIN D PO), Take 1 tablet by mouth daily., Disp: , Rfl:  .  dolutegravir (TIVICAY) 50 MG tablet, Take 1 tablet (50 mg total) by mouth daily. Patient will start new meds in December when he comes off study. He has ADAP, Disp: 30 tablet, Rfl: 11 .  emtricitabine-tenofovir AF (DESCOVY) 200-25 MG tablet, Take 1 tablet by mouth daily. Will start  once study ends in December, Disp: 30 tablet, Rfl: 11 .  IRON PO, Take 1 tablet by mouth daily., Disp: , Rfl:  .  naproxen (NAPROSYN) 375 MG tablet, Take 375 mg by mouth 2 (two) times daily as needed for mild pain. , Disp: , Rfl:  .  Pitavastatin Calcium 4 MG TABS, Take 1 tablet by mouth daily. This may be placebo and is study provided. Do not dispense., Disp: , Rfl:  .  sildenafil (VIAGRA) 100 MG tablet, Take 1 tablet (100 mg total) by mouth  daily as needed for erectile dysfunction. Come through patient assistance, Disp: 10 tablet, Rfl: 5 .  tamsulosin (FLOMAX) 0.4 MG CAPS capsule, Take 2 capsules (0.8 mg total) by mouth daily after supper. (Patient taking differently: Take 0.4 mg by mouth 2 (two) times daily. ), Disp: 60 capsule, Rfl: 11 .  vitamin B-12 (CYANOCOBALAMIN) 100 MCG tablet, Take 100 mcg by mouth daily., Disp: , Rfl:  .  acarbose (PRECOSE) 25 MG tablet, TAKE 1 TABLET BY MOUTH 3 TIMES DAILY WITH MEALS., Disp: , Rfl: 2 .  DULoxetine (CYMBALTA) 30 MG capsule, Take 1 capsule (30 mg total) by mouth daily., Disp: 60 capsule, Rfl: 4   Review of Systems  Constitutional: Negative for diaphoresis, activity change, appetite change, fatigue and unexpected weight change.  HENT: Negative for congestion, rhinorrhea, sinus pressure, sneezing, sore throat and trouble swallowing.   Eyes: Negative for photophobia and visual disturbance.  Respiratory: Negative for chest tightness, shortness of breath, wheezing and stridor.   Cardiovascular: Negative for chest pain, palpitations and leg swelling.  Gastrointestinal: Negative for nausea, constipation, blood in stool, abdominal distention and anal bleeding.  Genitourinary: Negative for dysuria, hematuria, flank pain, discharge and difficulty urinating.  Musculoskeletal: Negative for back pain, joint swelling and gait problem.  Skin: Negative for color change, pallor, rash and wound.  Neurological: Negative for dizziness, tremors, weakness and  light-headedness.  Hematological: Negative for adenopathy. Does not bruise/bleed easily.  Psychiatric/Behavioral: Positive for sleep disturbance and dysphoric mood. Negative for suicidal ideas, behavioral problems, confusion, self-injury, decreased concentration and agitation. The patient is nervous/anxious.        Objective:   Physical Exam  Constitutional: He is oriented to person, place, and time. He appears well-developed and well-nourished. No distress.  HENT:  Head: Normocephalic and atraumatic.  Mouth/Throat: Oropharynx is clear and moist. No oropharyngeal exudate.  Eyes: Conjunctivae and EOM are normal. Pupils are equal, round, and reactive to light. No scleral icterus.  Neck: Normal range of motion. Neck supple. No JVD present.  Cardiovascular: Normal rate, regular rhythm and normal heart sounds.  Exam reveals no gallop and no friction rub.   No murmur heard. Pulmonary/Chest: Effort normal and breath sounds normal. No respiratory distress. He has no wheezes. He has no rales. He exhibits no tenderness.  Abdominal: Soft. Bowel sounds are normal. He exhibits no distension and no mass. There is no tenderness. There is no rebound and no guarding.  Musculoskeletal: Normal range of motion. He exhibits no edema or tenderness.       Left hip: Normal.       Lumbar back: He exhibits normal range of motion, no tenderness and no bony tenderness.  Lymphadenopathy:    He has no cervical adenopathy.  Neurological: He is alert and oriented to person, place, and time. He has normal reflexes. He exhibits normal muscle tone. Coordination normal.  Skin: Skin is warm and dry. No rash noted. He is not diaphoretic. No erythema. No pallor.  Psychiatric: His behavior is normal. Judgment and thought content normal. He exhibits a depressed mood.          Assessment & Plan:   Major depression: I offered to change antidepressant therapy and made switch to Cymbalta. Given that he has hx of Bipolar per  Epic he may in fact need a mood stablizer in addition to this or instead of this. If so such drugs MUST be checked for interaction with his DTG as several AED have SIG interaction with them   Alcohol abuse: he  is back in [place]. Arthur [name] is going to need to focus intently on his OWN well being despite his generosity and desire to help others he has over extended himself severely, perhaps in part due to again a bipolar disorder.  HIV disease: continue  Tivicay with Descovy for regimen with THREE fully active drugs,  Post gastric bypass surgery hypoglycemia: he needs to adhere to strict diet and take his acarbose  TIA: with HTN Will need to carefully follow his BP. I did not introduce a new drug at this point given his mx symptoms, and anxiety Filed Vitals:   06/20/16 1553  BP: 144/89  Pulse: 59  Temp: 98.4 F (36.9 C)     I spent greater than 40 minutes with the patient including greater than 50% of time in face to face counsel of the patient re his major deprssion, relapse of alcholism, post gastric bypass surgery, TIA,HTN HIV and in coordination of his  care.

## 2016-06-21 ENCOUNTER — Encounter (INDEPENDENT_AMBULATORY_CARE_PROVIDER_SITE_OTHER): Payer: No Typology Code available for payment source | Admitting: *Deleted

## 2016-06-21 VITALS — BP 124/77 | HR 76 | Temp 98.0°F | Resp 16 | Wt 186.2 lb

## 2016-06-21 DIAGNOSIS — Z006 Encounter for examination for normal comparison and control in clinical research program: Secondary | ICD-10-CM

## 2016-06-21 LAB — COMPREHENSIVE METABOLIC PANEL
ALK PHOS: 66 U/L (ref 40–115)
ALT: 15 U/L (ref 9–46)
AST: 21 U/L (ref 10–35)
Albumin: 4.1 g/dL (ref 3.6–5.1)
BILIRUBIN TOTAL: 0.7 mg/dL (ref 0.2–1.2)
BUN: 8 mg/dL (ref 7–25)
CO2: 23 mmol/L (ref 20–31)
CREATININE: 0.99 mg/dL (ref 0.70–1.25)
Calcium: 8.9 mg/dL (ref 8.6–10.3)
Chloride: 105 mmol/L (ref 98–110)
GLUCOSE: 113 mg/dL — AB (ref 65–99)
Potassium: 4.5 mmol/L (ref 3.5–5.3)
Sodium: 138 mmol/L (ref 135–146)
Total Protein: 6.4 g/dL (ref 6.1–8.1)

## 2016-06-21 NOTE — Progress Notes (Signed)
Arthur [name] was here today for a visit for Reprieve, A Randomized Trial to Prevent Vascular Events in HIV (study drug is Pitavastatin 4mg  or placebo). He says his adherence has been excellent and he denies any current muscle pain or weakness. He did enroll in the Prepare substudy today after informed consent was obtained. This study includes physical frailty testing once a year while in Reprieve. 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 He plans to call up [facility] once he gets his orange card renewed today and see if he can get in to see them for counseling. He plans to start cymbalta today and see if that helps his depression more than the welbutrin does. He will return in October for the next Reprieve visit.

## 2016-07-04 ENCOUNTER — Ambulatory Visit: Payer: Self-pay

## 2016-07-08 ENCOUNTER — Encounter: Payer: Self-pay | Admitting: *Deleted

## 2016-07-08 ENCOUNTER — Other Ambulatory Visit: Payer: Self-pay | Admitting: *Deleted

## 2016-07-08 DIAGNOSIS — F32A Depression, unspecified: Secondary | ICD-10-CM

## 2016-07-08 DIAGNOSIS — F329 Major depressive disorder, single episode, unspecified: Secondary | ICD-10-CM

## 2016-07-08 MED ORDER — DULOXETINE HCL 60 MG PO CPEP: 60.0000 mg | ORAL_CAPSULE | Freq: Every day | ORAL | 5 refills | Status: DC

## 2016-08-08 ENCOUNTER — Ambulatory Visit: Payer: Self-pay | Admitting: Infectious Disease

## 2016-08-30 ENCOUNTER — Telehealth: Payer: Self-pay

## 2016-08-30 NOTE — Telephone Encounter (Signed)
Received voice message from patient wanting to make an appointment with counselor Bernette RedbirdKenny. First available 09/25 at 10 AM.  Returned phone call and left message giving appointment information. Explained if appointment was not a good time to call back to reschedule. Rejeana Brockandace Teela Narducci, LPN

## 2016-09-05 ENCOUNTER — Ambulatory Visit: Payer: Self-pay

## 2016-09-08 ENCOUNTER — Other Ambulatory Visit: Payer: Self-pay | Admitting: Infectious Disease

## 2016-09-30 ENCOUNTER — Other Ambulatory Visit: Payer: Self-pay | Admitting: Infectious Disease

## 2016-10-07 ENCOUNTER — Encounter: Payer: Self-pay | Admitting: Licensed Clinical Social Worker

## 2016-10-10 ENCOUNTER — Ambulatory Visit (INDEPENDENT_AMBULATORY_CARE_PROVIDER_SITE_OTHER): Payer: No Typology Code available for payment source | Admitting: Internal Medicine

## 2016-10-10 ENCOUNTER — Encounter (INDEPENDENT_AMBULATORY_CARE_PROVIDER_SITE_OTHER): Payer: No Typology Code available for payment source | Admitting: *Deleted

## 2016-10-10 ENCOUNTER — Encounter: Payer: Self-pay | Admitting: Internal Medicine

## 2016-10-10 VITALS — BP 139/84 | HR 58 | Temp 98.2°F | Ht 69.25 in | Wt 181.5 lb

## 2016-10-10 VITALS — BP 134/83 | HR 65 | Temp 98.4°F | Resp 16 | Wt 180.5 lb

## 2016-10-10 DIAGNOSIS — Z9884 Bariatric surgery status: Secondary | ICD-10-CM

## 2016-10-10 DIAGNOSIS — Z0000 Encounter for general adult medical examination without abnormal findings: Secondary | ICD-10-CM

## 2016-10-10 DIAGNOSIS — R918 Other nonspecific abnormal finding of lung field: Secondary | ICD-10-CM

## 2016-10-10 DIAGNOSIS — Z818 Family history of other mental and behavioral disorders: Secondary | ICD-10-CM

## 2016-10-10 DIAGNOSIS — Z8249 Family history of ischemic heart disease and other diseases of the circulatory system: Secondary | ICD-10-CM

## 2016-10-10 DIAGNOSIS — Z006 Encounter for examination for normal comparison and control in clinical research program: Secondary | ICD-10-CM

## 2016-10-10 DIAGNOSIS — F32A Depression, unspecified: Secondary | ICD-10-CM

## 2016-10-10 DIAGNOSIS — F319 Bipolar disorder, unspecified: Secondary | ICD-10-CM

## 2016-10-10 DIAGNOSIS — F329 Major depressive disorder, single episode, unspecified: Secondary | ICD-10-CM

## 2016-10-10 DIAGNOSIS — E162 Hypoglycemia, unspecified: Secondary | ICD-10-CM

## 2016-10-10 DIAGNOSIS — Z23 Encounter for immunization: Secondary | ICD-10-CM

## 2016-10-10 LAB — GLUCOSE, CAPILLARY: GLUCOSE-CAPILLARY: 86 mg/dL (ref 65–99)

## 2016-10-10 LAB — POCT GLYCOSYLATED HEMOGLOBIN (HGB A1C): Hemoglobin A1C: 5.4

## 2016-10-10 NOTE — Progress Notes (Signed)
Case discussed with Dr. Saraiya at the time of the visit.  We reviewed the resident's history and exam and pertinent patient test results.  I agree with the assessment, diagnosis and plan of care documented in the resident's note. 

## 2016-10-10 NOTE — Assessment & Plan Note (Addendum)
Patient was referred here for history of hypoglycemia. [place] SpanielSaid he had an episode in April when [facility] out [facility] Pt had history of R-en-Y gastric bypass surgery in April 2012, and it was attributed in April due to that. Patient was on acarbose, but is not taking that and is taking low-carb diet. He checks his BGs occasionally and they run in the 80-100s.  He has not had any lows less than 70 after April. He was not able to tolerate the acarbose so he has stopped taking it.   A1c today was 5.4. CBG was 86.  Differentials include post-gastric bypass hypoglycemia, insulinoma, decreased cortisol.  Plan -given that he has not had any episode of hypoglycemia, we will continue to monitor it. -advised pt for low carb diet   -have stopped the acarbose as pt not able to tolerate

## 2016-10-10 NOTE — Progress Notes (Signed)
Arthur [name] is here for his month 16 visit for Reprieve, A Randomized Trial to Prevent Vascular Events in HIV (study drug is Pitavastatin 4mg  or placebo).  He says his adherence is excellent. His last VL was 29 but has not missed any doses of meds. We will get labs on him next week for followup. He has started losing weight again, which he did prior to his hypoglycemic event when he ended up in the hospital [facility]. He says his blood sugars have been okay. He has problems urinating unless he takes his flomax which helps quite a bit. He said he has not his prostate checked nor a PSA level drawn that I could see in his record. He doesn't have a primary care doctor and needs to get a good physical . We did get an appt for one [facility]. He also was given a flushot today.

## 2016-10-10 NOTE — Assessment & Plan Note (Signed)
Patient says that his depression symptoms are well controlled on the cymbalta.  Plan -continue cymbalta -referred to psychiatry for management of his bipolar along with depression

## 2016-10-10 NOTE — Patient Instructions (Signed)
Thank you for your visit today  Please follow up with psychiatry  Please do the xray of the chest  Please follow up in 3 months
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
## 2016-10-10 NOTE — Progress Notes (Signed)
   CC: hypoglycemia, HM, bipolar/depression HPI: [name] is a 60 y.o. man with PMH noted below here to establish care, and for referral to psychiatry for bipolar, health maintenance, history of hypoglycemia,.   Please see Problem List/A&P for the status of the patient's chronic medical problems   Past Medical History:  Diagnosis Date  . Alcoholism (HCC) 06/20/2016  . Bipolar disorder (HCC)   . Cough 11/17/2015  . Depression   . Diarrhea 11/25/2015  . Difficulty voiding 12/15/2015  . Dysuria 11/25/2015  . Fever, unspecified 11/17/2015  . Flank pain 11/25/2015  . History of blood transfusion 12-12-1998  . HIV infection (HCC)   . 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 Late syphilis   . Lower back pain 11/25/2015  . Recurrent major depression-severe (HCC) 06/20/2016   Family History: Depression on both sides Mother died of stroke at 4860 Father died of MI at 1180  Social History: Denies alcohol use, denies smoking Currently sexually active with men and uses protection \ Lives by himself    Review of Systems:  Constitutional: Negative for fever, chills, +weight loss and +malaise/fatigue.  HEENT: No headaches, vision problems, cough, hearing problems  Respiratory: Negative for cough, shortness of breath and wheezing.  Cardiovascular: Negative for chest pain, orthopnea, or PND   Gastrointestinal: Negative for heartburn, nausea, vomiting, abdominal pain, +diarrhea , no constipation. No hematochezia, or melena GU:  +dysuria , no hematuria. No urinary frequency or urgency Musculoskeletal: Negative for myalgias, joint pain, falls and neck pain.  Skin: Negative for rash.  Neurological: Negative for tingling. No falls.   Physical Exam:  Vitals:   10/10/16 1120  BP: 139/84  Pulse: (!) 58  Temp: 98.2 F (36.8 C)    General: A&O, in NAD HEENT: EOMI, sclera white, conjunctiva pink  Neck: supple, midline trachea, no cervical lymphadenopathy  CV: RRR,  normal s1, s2, no m/r/g, Resp: equal and symmetric breath sounds, no wheezing or crackles  Abdomen: soft, nontender, nondistended, +BS Skin: warm, dry, intact, no open lesions or rashes noted Extremities: pulses intact b/l, no edema, clubbing or cyanosis Neurologic: no focal neurological deficits   Assessment & Plan:   See encounters tab for problem based medical decision making. Patient discussed with Dr. Josem KaufmannKlima

## 2016-10-10 NOTE — Assessment & Plan Note (Signed)
Pt was diagnosed with bipolar and at one point was on depakote, but currently only on cymbalta. He would like referral to psychiatry  Plan -referred to psychiatry

## 2016-10-11 ENCOUNTER — Telehealth: Payer: Self-pay | Admitting: Licensed Clinical Social Worker

## 2016-10-11 ENCOUNTER — Encounter: Payer: Self-pay | Admitting: Licensed Clinical Social Worker

## 2016-10-11 NOTE — Telephone Encounter (Signed)
[name] was referred for psychiatry.  [facility] is [facility], CSW confirming if pt has completed the [facility] [employer] [employer] application.  CSW placed call to [name].  Pt informed of local agencies that provide psychiatry services and accept referral with [facility]; [facility] and [facility].  [facility] and aware of their current scheduling time frame.  [name] is aware of [facility] walk-in access program and can use if needing to be seen earlier.  Pt would like to wait and [facility].  [facility].

## 2016-10-12 ENCOUNTER — Ambulatory Visit: Payer: Self-pay

## 2016-10-19 ENCOUNTER — Other Ambulatory Visit: Payer: Self-pay

## 2016-11-08 NOTE — Progress Notes (Deleted)
Psychiatric Initial Adult Assessment   Patient Identification: Arthur [name] MRN:  [id number] Date of Evaluation:  11/08/2016 Referral Source: *** Chief Complaint:   Visit Diagnosis: No diagnosis found.  History of Present Illness:   Arthur [name] is a 60 year old male with bipolar disorder, alcohol use disorder, HIV, late syphilis, who is referred for bipolar disorder.   Associated Signs/Symptoms: Depression Symptoms:  {DEPRESSION SYMPTOMS:20000} (Hypo) Manic Symptoms:  {BHH MANIC SYMPTOMS:22872} Anxiety Symptoms:  {BHH ANXIETY SYMPTOMS:22873} Psychotic Symptoms:  {BHH PSYCHOTIC SYMPTOMS:22874} PTSD Symptoms: {BHH PTSD SYMPTOMS:22875}  Past Psychiatric History: ***  Previous Psychotropic Medications: {YES/NO:21197}  Substance Abuse History in the last 12 months:  {yes no:314532}  Consequences of Substance Abuse: {BHH CONSEQUENCES OF SUBSTANCE ABUSE:22880}  Past Medical History:  Past Medical History:  Diagnosis Date  . Alcoholism (HCC) 06/20/2016  . Bipolar disorder (HCC)   . Cough 11/17/2015  . Depression   . Diarrhea 11/25/2015  . Difficulty voiding 12/15/2015  . Dysuria 11/25/2015  . Fever, unspecified 11/17/2015  . Flank pain 11/25/2015  . History of blood transfusion 12-12-1998  . HIV infection (HCC)   . 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 Late syphilis   . Lower back pain 11/25/2015  . Recurrent major depression-severe (HCC) 06/20/2016    Past Surgical History:  Procedure Laterality Date  . broken right leg Right 2000  . FRACTURE SURGERY    . [facility]  [facility]    Family Psychiatric History: ***  Family History:  Family History  Problem Relation Age of Onset  . Heart disease Mother   . Stroke Mother   . Heart disease Father     Social History:   Social History   Social History  . Marital status: Single    Spouse name: N/A  . Number of children: N/A  . Years of education: N/A   Social History Main Topics  .  Smoking status: Never Smoker  . Smokeless tobacco: Never Used  . Alcohol use No     Comment: quit 01/15/15  . Drug use: No  . Sexual activity: Not Currently    Partners: Male   Other Topics Concern  . Not on file   Social History Narrative  . No narrative on file    Additional Social History: ***  Allergies:  No Known Allergies  Metabolic Disorder Labs: Lab Results  Component Value Date   HGBA1C 5.4 10/10/2016   No results found for: PROLACTIN Lab Results  Component Value Date   CHOL 120 (L) 05/25/2016   TRIG 52 05/25/2016   HDL 63 05/25/2016   CHOLHDL 1.9 05/25/2016   VLDL 10 05/25/2016   LDLCALC 47 05/25/2016   LDLCALC 62 10/19/2015     Current Medications: Current Outpatient Prescriptions  Medication Sig Dispense Refill  . Cholecalciferol (VITAMIN D PO) Take 1 tablet by mouth daily.    . DESCOVY 200-25 MG tablet TAKE 1 TABLET BY MOUTH DAILY 30 tablet 4  . DULoxetine (CYMBALTA) 60 MG capsule Take 1 capsule (60 mg total) by mouth daily. Please take 60 mg daily. 30 capsule 5  . IRON PO Take 1 tablet by mouth daily.    . naproxen (NAPROSYN) 375 MG tablet Take 375 mg by mouth 2 (two) times daily as needed for mild pain.     . Pitavastatin Calcium 4 MG TABS Take 1 tablet by mouth daily. This may be placebo and is study provided. Do not dispense.    . sildenafil (VIAGRA) 100  MG tablet Take 1 tablet (100 mg total) by mouth daily as needed for erectile dysfunction. Come through patient assistance 10 tablet 5  . tamsulosin (FLOMAX) 0.4 MG CAPS capsule Take 2 capsules (0.8 mg total) by mouth daily after supper. (Patient taking differently: Take 0.4 mg by mouth 2 (two) times daily. ) 60 capsule 11  . TIVICAY 50 MG tablet TAKE 1 TABLET BY MOUTH DAILY 30 tablet 4  . vitamin B-12 (CYANOCOBALAMIN) 100 MCG tablet Take 100 mcg by mouth daily.     No current facility-administered medications for this visit.     Neurologic: Headache: {BHH YES OR NO:22294} Seizure: {BHH YES OR  NO:22294} Paresthesias:{BHH YES OR ZO:10960}O:22294}  Musculoskeletal: Strength & Muscle Tone: {desc; muscle tone:32375} Gait & Station: {PE GAIT ED AVWU:98119}ATL:22525} Patient leans: {Patient Leans:21022755}  Psychiatric Specialty Exam: ROS  There were no vitals taken for this visit.There is no height or weight on file to calculate BMI.  General Appearance: {Appearance:22683}  Eye Contact:  {BHH EYE CONTACT:22684}  Speech:  {Speech:22685}  Volume:  {Volume (PAA):22686}  Mood:  {BHH MOOD:22306}  Affect:  {Affect (PAA):22687}  Thought Process:  {Thought Process (PAA):22688}  Orientation:  {BHH ORIENTATION (PAA):22689}  Thought Content:  {Thought Content:22690}  Suicidal Thoughts:  {ST/HT (PAA):22692}  Homicidal Thoughts:  {ST/HT (PAA):22692}  Memory:  {BHH MEMORY:22881}  Judgement:  {Judgement (PAA):22694}  Insight:  {Insight (PAA):22695}  Psychomotor Activity:  {Psychomotor (PAA):22696}  Concentration:  {Concentration:21399}  Recall:  {BHH GOOD/FAIR/POOR:22877}  Fund of Knowledge:{BHH GOOD/FAIR/POOR:22877}  Language: {BHH GOOD/FAIR/POOR:22877}  Akathisia:  {BHH YES OR NO:22294}  Handed:  {Handed:22697}  AIMS (if indicated):  ***  Assets:  {Assets (PAA):22698}  ADL's:  {BHH JYN'W:29562}ADL'S:22290}  Cognition: {chl bhh cognition:304700322}  Sleep:  ***    Treatment Plan Summary: {CHL AMB BH MD TX ZHYQ:[id number]}[phone]}   Arthur Hottereina Rebekah Zackery, MD 11/28/20172:31 PM

## 2016-11-09 ENCOUNTER — Ambulatory Visit (HOSPITAL_COMMUNITY): Payer: Self-pay | Admitting: Psychiatry

## 2016-11-09 ENCOUNTER — Other Ambulatory Visit (INDEPENDENT_AMBULATORY_CARE_PROVIDER_SITE_OTHER): Payer: No Typology Code available for payment source

## 2016-11-09 DIAGNOSIS — B20 Human immunodeficiency virus [HIV] disease: Secondary | ICD-10-CM

## 2016-11-09 LAB — CBC
HCT: 40.1 % (ref 38.5–50.0)
HEMOGLOBIN: 12.6 g/dL — AB (ref 13.2–17.1)
MCH: 31.3 pg (ref 27.0–33.0)
MCHC: 31.4 g/dL — ABNORMAL LOW (ref 32.0–36.0)
MCV: 99.8 fL (ref 80.0–100.0)
MPV: 9.4 fL (ref 7.5–12.5)
Platelets: 231 10*3/uL (ref 140–400)
RBC: 4.02 MIL/uL — AB (ref 4.20–5.80)
RDW: 14.2 % (ref 11.0–15.0)
WBC: 7.2 10*3/uL (ref 3.8–10.8)

## 2016-11-09 LAB — COMPREHENSIVE METABOLIC PANEL
ALBUMIN: 3.7 g/dL (ref 3.6–5.1)
ALK PHOS: 64 U/L (ref 40–115)
ALT: 15 U/L (ref 9–46)
AST: 25 U/L (ref 10–35)
BUN: 9 mg/dL (ref 7–25)
CALCIUM: 8.7 mg/dL (ref 8.6–10.3)
CO2: 27 mmol/L (ref 20–31)
Chloride: 105 mmol/L (ref 98–110)
Creat: 0.86 mg/dL (ref 0.70–1.25)
GLUCOSE: 99 mg/dL (ref 65–99)
POTASSIUM: 4.1 mmol/L (ref 3.5–5.3)
Sodium: 139 mmol/L (ref 135–146)
Total Bilirubin: 1 mg/dL (ref 0.2–1.2)
Total Protein: 6.1 g/dL (ref 6.1–8.1)

## 2016-11-10 LAB — HIV-1 RNA QUANT-NO REFLEX-BLD
HIV 1 RNA QUANT: 38 {copies}/mL — AB (ref <20)
HIV-1 RNA QUANT, LOG: 1.58 {Log_copies}/mL — AB (ref <1.30)

## 2016-11-10 LAB — T-HELPER CELL (CD4) - (RCID CLINIC ONLY)
CD4 % Helper T Cell: 26 % — ABNORMAL LOW (ref 33–55)
CD4 T Cell Abs: 1090 /uL (ref 400–2700)

## 2016-11-16 ENCOUNTER — Encounter: Payer: Self-pay | Admitting: Licensed Clinical Social Worker

## 2016-11-17 ENCOUNTER — Encounter: Payer: Self-pay | Admitting: Infectious Disease

## 2016-11-17 ENCOUNTER — Ambulatory Visit (INDEPENDENT_AMBULATORY_CARE_PROVIDER_SITE_OTHER): Payer: No Typology Code available for payment source | Admitting: Infectious Disease

## 2016-11-17 ENCOUNTER — Other Ambulatory Visit: Payer: Self-pay

## 2016-11-17 VITALS — BP 151/85 | HR 59 | Temp 98.2°F | Wt 191.0 lb

## 2016-11-17 DIAGNOSIS — F1020 Alcohol dependence, uncomplicated: Secondary | ICD-10-CM

## 2016-11-17 DIAGNOSIS — B20 Human immunodeficiency virus [HIV] disease: Secondary | ICD-10-CM

## 2016-11-17 DIAGNOSIS — F319 Bipolar disorder, unspecified: Secondary | ICD-10-CM

## 2016-11-17 DIAGNOSIS — G458 Other transient cerebral ischemic attacks and related syndromes: Secondary | ICD-10-CM

## 2016-11-17 DIAGNOSIS — G459 Transient cerebral ischemic attack, unspecified: Secondary | ICD-10-CM

## 2016-11-17 DIAGNOSIS — F332 Major depressive disorder, recurrent severe without psychotic features: Secondary | ICD-10-CM

## 2016-11-17 DIAGNOSIS — Z23 Encounter for immunization: Secondary | ICD-10-CM

## 2016-11-17 DIAGNOSIS — K912 Postsurgical malabsorption, not elsewhere classified: Secondary | ICD-10-CM

## 2016-11-17 DIAGNOSIS — I10 Essential (primary) hypertension: Secondary | ICD-10-CM

## 2016-11-17 HISTORY — DX: Transient cerebral ischemic attack, unspecified: G45.9

## 2016-11-17 HISTORY — DX: Essential (primary) hypertension: I10

## 2016-11-17 MED ORDER — HYDROCHLOROTHIAZIDE 25 MG PO TABS: 25.0000 mg | ORAL_TABLET | Freq: Every day | ORAL | 11 refills | Status: DC

## 2016-11-17 NOTE — Progress Notes (Signed)
Chief complaints: This is concerned about the viral blips that excluded him from an ACT G structured interruption therapy trial.  Subjective:    Patient ID: Arthur [name], male    DOB: Sep 24, 1956, 60 y.o.   MRN: [id number]  HPI  60 year old with HIV diagnosed a little more than one year ago.  He has history of bipolar disorder and alcohol abuse that had been in  remission.   He had been enrolled into  ACTG study and had been on  Dolutegravir and Lamivudine (dual therapy) and now changed over to  [place] and Descovy.  He has a very nicely suppressed viral load and healthy CD4 count. Unfortunately because of viral load is not actually less than 20 consistently on recent checks he is not allowed to pertussis 8 in the structured interruption trial that he was interested in. He was concerned about what the meaning was of his viral load being higher now ongoing from 29-38. He claims he never missed a single dose of intervertebral since he first began taking antiretrovirals as part of the ACT G 5353 study study. I explained to him that this was likely a blip due to lab air or due to part of the virus from the reservoir emerging which happens from time to time in our patients and of no significance clinically.  He is engaged in alcohol can send on this also goes to heart ground once a week for support he is also try to get to be seen by a psychiatrist he is taking Cymbalta but believes he needs a mood stabilizer as well. I made sure to ask him to inquire about drug drug interactions if he is placed on mood stabilizer via some of the antiepileptic drugs have a drug interaction with TIVICAY and may lower the TIVICAY levels.  Lab Results  Component Value Date   HIV1RNAQUANT 38 (H) 11/09/2016   HIV1RNAQUANT 29 (H) 05/25/2016   HIV1RNAQUANT <20 12/15/2015     Lab Results  Component Value Date   CD4TABS 1,090 11/09/2016   CD4TABS 960 05/25/2016   CD4TABS 930 12/15/2015   .       Past Medical  History:  Diagnosis Date  . Alcoholism (HCC) 06/20/2016  . Bipolar disorder (HCC)   . Cough 11/17/2015  . Depression   . Diarrhea 11/25/2015  . Difficulty voiding 12/15/2015  . Dysuria 11/25/2015  . Fever, unspecified 11/17/2015  . Flank pain 11/25/2015  . History of blood transfusion 12-12-1998  . HIV infection (HCC)   . 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 Late syphilis   . Lower back pain 11/25/2015  . Recurrent major depression-severe (HCC) 06/20/2016    Past Surgical History:  Procedure Laterality Date  . broken right leg Right 2000  . FRACTURE SURGERY    . GASTRIC BYPASS  2012   [facility]    Family History  Problem Relation Age of Onset  . Heart disease Mother   . Stroke Mother   . Heart disease Father       Social History   Social History  . Marital status: Single    Spouse name: N/A  . Number of children: N/A  . Years of education: N/A   Social History Main Topics  . Smoking status: Never Smoker  . Smokeless tobacco: Never Used  . Alcohol use No     Comment: quit 01/15/15  . Drug use: No  . Sexual activity: Not Currently    Partners: Male   Other  Topics Concern  . None   Social History Narrative  . None    No Known Allergies   Current Outpatient Prescriptions:  .  Cholecalciferol (VITAMIN D PO), Take 1 tablet by mouth daily., Disp: , Rfl:  .  DESCOVY 200-25 MG tablet, TAKE 1 TABLET BY MOUTH DAILY, Disp: 30 tablet, Rfl: 4 .  DULoxetine (CYMBALTA) 60 MG capsule, Take 1 capsule (60 mg total) by mouth daily. Please take 60 mg daily., Disp: 30 capsule, Rfl: 5 .  IRON PO, Take 1 tablet by mouth daily., Disp: , Rfl:  .  naproxen (NAPROSYN) 375 MG tablet, Take 375 mg by mouth 2 (two) times daily as needed for mild pain. , Disp: , Rfl:  .  Pitavastatin Calcium 4 MG TABS, Take 1 tablet by mouth daily. This may be placebo and is study provided. Do not dispense., Disp: , Rfl:  .  sildenafil (VIAGRA) 100 MG tablet, Take 1 tablet (100 mg total)  by mouth daily as needed for erectile dysfunction. Come through patient assistance, Disp: 10 tablet, Rfl: 5 .  tamsulosin (FLOMAX) 0.4 MG CAPS capsule, Take 2 capsules (0.8 mg total) by mouth daily after supper. (Patient taking differently: Take 0.4 mg by mouth 2 (two) times daily. ), Disp: 60 capsule, Rfl: 11 .  TIVICAY 50 MG tablet, TAKE 1 TABLET BY MOUTH DAILY, Disp: 30 tablet, Rfl: 4 .  vitamin B-12 (CYANOCOBALAMIN) 100 MCG tablet, Take 100 mcg by mouth daily., Disp: , Rfl:    Review of Systems  Constitutional: Negative for activity change, appetite change, diaphoresis, fatigue and unexpected weight change.  HENT: Negative for congestion, rhinorrhea, sinus pressure, sneezing, sore throat and trouble swallowing.   Eyes: Negative for photophobia and visual disturbance.  Respiratory: Negative for chest tightness, shortness of breath, wheezing and stridor.   Cardiovascular: Negative for chest pain, palpitations and leg swelling.  Gastrointestinal: Negative for abdominal distention, anal bleeding, blood in stool, constipation and nausea.  Genitourinary: Negative for difficulty urinating, discharge, dysuria, flank pain and hematuria.  Musculoskeletal: Negative for back pain, gait problem and joint swelling.  Skin: Negative for color change, pallor, rash and wound.  Neurological: Negative for dizziness, tremors, weakness and light-headedness.  Hematological: Negative for adenopathy. Does not bruise/bleed easily.  Psychiatric/Behavioral: Positive for dysphoric mood. Negative for agitation, behavioral problems, confusion, decreased concentration, self-injury and suicidal ideas. The patient is nervous/anxious.        Objective:   Physical Exam  Constitutional: He is oriented to person, place, and time. He appears well-developed and well-nourished. No distress.  HENT:  Head: Normocephalic and atraumatic.  Mouth/Throat: Oropharynx is clear and moist. No oropharyngeal exudate.  Eyes: Conjunctivae  and EOM are normal. Pupils are equal, round, and reactive to light. No scleral icterus.  Neck: Normal range of motion. Neck supple. No JVD present.  Cardiovascular: Normal rate, regular rhythm and normal heart sounds.  Exam reveals no gallop and no friction rub.   No murmur heard. Pulmonary/Chest: Effort normal and breath sounds normal. No respiratory distress. He has no wheezes. He has no rales. He exhibits no tenderness.  Abdominal: Soft. Bowel sounds are normal. He exhibits no distension and no mass. There is no tenderness. There is no rebound and no guarding.  Musculoskeletal: Normal range of motion. He exhibits no edema or tenderness.       Left hip: Normal.       Lumbar back: He exhibits normal range of motion, no tenderness and no bony tenderness.  Lymphadenopathy:  He has no cervical adenopathy.  Neurological: He is alert and oriented to person, place, and time. He has normal reflexes. He exhibits normal muscle tone. Coordination normal.  Skin: Skin is warm and dry. No rash noted. He is not diaphoretic. No erythema. No pallor.  Psychiatric: His behavior is normal. Judgment and thought content normal.          Assessment & Plan:    HIV disease: continue  Tivicay with Descovy for regimen with THREE fully active drugs,  Post gastric bypass surgery hypoglycemia: he needs to adhere to strict diet and take his acarbose  TIA: with HTN Will need to carefully follow his BP.  Did not address with him during the visit but I will add hydrochlorothiazide if he is comfortable with this.  Depression with bipolar disease order continue Cymbalta but I agree might benefit the being on mood stabilizer hope he will have this done by psychiatrist again caution interaction with TIVICAY some of these agents such as Lamictal and Dilantin among others.   I spent greater than 40 minutes with the patient including greater than 50% of time in face to face counsel of the patient re his major  deprssion, relapse of alcholism, post gastric bypass surgery, TIA,HTN HIV , viral blip's and in coordination of his  care.

## 2016-11-18 ENCOUNTER — Telehealth: Payer: Self-pay | Admitting: *Deleted

## 2016-11-18 NOTE — Telephone Encounter (Signed)
Called Arthur [name] to let him know that Dr. Daiva EvesVan Dam had prescribed HCTZ for him since his BP was up a little. He said he will go to the pharmacy to get it and start soon.

## 2016-11-22 ENCOUNTER — Encounter: Payer: Self-pay | Admitting: Infectious Disease

## 2016-12-02 NOTE — Addendum Note (Signed)
Addended by: Neomia DearPOWERS, Madelina Sanda E on: 12/02/2016 11:28 AM   Modules accepted: Orders

## 2016-12-14 ENCOUNTER — Other Ambulatory Visit: Payer: Self-pay | Admitting: *Deleted

## 2016-12-14 ENCOUNTER — Other Ambulatory Visit: Payer: Self-pay | Admitting: Infectious Disease

## 2016-12-14 ENCOUNTER — Telehealth: Payer: Self-pay | Admitting: Infectious Disease

## 2016-12-14 DIAGNOSIS — B20 Human immunodeficiency virus [HIV] disease: Secondary | ICD-10-CM

## 2016-12-14 MED ORDER — TAMSULOSIN HCL 0.4 MG PO CAPS
0.4000 mg | ORAL_CAPSULE | Freq: Two times a day (BID) | ORAL | 11 refills | Status: DC
Start: 2016-12-14 — End: 2017-08-13

## 2016-12-14 NOTE — Telephone Encounter (Signed)
Returned patient's call, he is requesting financial assistance for his flomax, there is not currently a program to help pay 100% but [facility] is cheaper than he is already paying. Patient is asking for a prescription to be sent to them so he can pick it up there.

## 2016-12-14 NOTE — Telephone Encounter (Signed)
[facility]

## 2016-12-19 ENCOUNTER — Emergency Department (HOSPITAL_COMMUNITY)
Admission: EM | Admit: 2016-12-19 | Discharge: 2016-12-20 | Disposition: A | Discharge status: Other Acute Inpatient Hospital | Payer: No Typology Code available for payment source | Attending: Emergency Medicine | Admitting: Emergency Medicine

## 2016-12-19 ENCOUNTER — Encounter (HOSPITAL_COMMUNITY): Payer: Self-pay | Admitting: Emergency Medicine

## 2016-12-19 DIAGNOSIS — F329 Major depressive disorder, single episode, unspecified: Secondary | ICD-10-CM | POA: Insufficient documentation

## 2016-12-19 DIAGNOSIS — F32A Depression, unspecified: Secondary | ICD-10-CM

## 2016-12-19 DIAGNOSIS — I10 Essential (primary) hypertension: Secondary | ICD-10-CM | POA: Insufficient documentation

## 2016-12-19 DIAGNOSIS — R45851 Suicidal ideations: Secondary | ICD-10-CM | POA: Insufficient documentation

## 2016-12-19 DIAGNOSIS — Z8673 Personal history of transient ischemic attack (TIA), and cerebral infarction without residual deficits: Secondary | ICD-10-CM | POA: Insufficient documentation

## 2016-12-19 DIAGNOSIS — F1010 Alcohol abuse, uncomplicated: Secondary | ICD-10-CM | POA: Insufficient documentation

## 2016-12-19 DIAGNOSIS — Z79899 Other long term (current) drug therapy: Secondary | ICD-10-CM | POA: Insufficient documentation

## 2016-12-19 LAB — ETHANOL: Alcohol, Ethyl (B): 143 mg/dL — ABNORMAL HIGH (ref <5)

## 2016-12-19 LAB — URINALYSIS, ROUTINE W REFLEX MICROSCOPIC
Bilirubin Urine: NEGATIVE
Glucose, UA: NEGATIVE mg/dL
Hgb urine dipstick: NEGATIVE
Ketones, ur: NEGATIVE mg/dL
Leukocytes, UA: NEGATIVE
NITRITE: NEGATIVE
Protein, ur: NEGATIVE mg/dL
SPECIFIC GRAVITY, URINE: 1.005 (ref 1.005–1.030)
pH: 5 (ref 5.0–8.0)

## 2016-12-19 LAB — COMPREHENSIVE METABOLIC PANEL
ALK PHOS: 57 U/L (ref 38–126)
ALT: 20 U/L (ref 17–63)
AST: 32 U/L (ref 15–41)
Albumin: 4.1 g/dL (ref 3.5–5.0)
Anion gap: 14 (ref 5–15)
BILIRUBIN TOTAL: 0.8 mg/dL (ref 0.3–1.2)
BUN: 11 mg/dL (ref 6–20)
CALCIUM: 9 mg/dL (ref 8.9–10.3)
CO2: 20 mmol/L — AB (ref 22–32)
CREATININE: 1.01 mg/dL (ref 0.61–1.24)
Chloride: 101 mmol/L (ref 101–111)
GFR calc non Af Amer: >60 mL/min (ref >60)
GLUCOSE: 90 mg/dL (ref 65–99)
Potassium: 3.9 mmol/L (ref 3.5–5.1)
Sodium: 135 mmol/L (ref 135–145)
TOTAL PROTEIN: 6.5 g/dL (ref 6.5–8.1)

## 2016-12-19 LAB — CBC
HCT: 35.8 % — ABNORMAL LOW (ref 39.0–52.0)
Hemoglobin: 12.3 g/dL — ABNORMAL LOW (ref 13.0–17.0)
MCH: 31.9 pg (ref 26.0–34.0)
MCHC: 34.4 g/dL (ref 30.0–36.0)
MCV: 92.7 fL (ref 78.0–100.0)
PLATELETS: 188 10*3/uL (ref 150–400)
RBC: 3.86 MIL/uL — ABNORMAL LOW (ref 4.22–5.81)
RDW: 13.3 % (ref 11.5–15.5)
WBC: 8.8 10*3/uL (ref 4.0–10.5)

## 2016-12-19 LAB — RAPID URINE DRUG SCREEN, HOSP PERFORMED
Amphetamines: NOT DETECTED
Barbiturates: NOT DETECTED
Benzodiazepines: NOT DETECTED
Cocaine: NOT DETECTED
OPIATES: NOT DETECTED
Tetrahydrocannabinol: NOT DETECTED

## 2016-12-19 MED ORDER — LORAZEPAM 1 MG PO TABS: 0.0000 mg | ORAL_TABLET | Freq: Two times a day (BID) | ORAL | Status: DC

## 2016-12-19 MED ORDER — ALUM & MAG HYDROXIDE-SIMETH 200-200-20 MG/5ML PO SUSP: 30.0000 mL | ORAL | Status: DC | PRN

## 2016-12-19 MED ORDER — ONDANSETRON 4 MG PO TBDP
4.0000 mg | ORAL_TABLET | Freq: Once | ORAL | Status: AC | PRN
  Administered 2016-12-19: 4 mg via ORAL

## 2016-12-19 MED ORDER — ONDANSETRON HCL 4 MG PO TABS
4.0000 mg | ORAL_TABLET | Freq: Three times a day (TID) | ORAL | Status: DC | PRN
  Administered 2016-12-20: 4 mg via ORAL
  Filled 2016-12-19: qty 1

## 2016-12-19 MED ORDER — ONDANSETRON 4 MG PO TBDP
ORAL_TABLET | ORAL | Status: AC
  Filled 2016-12-19: qty 1

## 2016-12-19 MED ORDER — THIAMINE HCL 100 MG/ML IJ SOLN: 100.0000 mg | Freq: Every day | INTRAMUSCULAR | Status: DC

## 2016-12-19 MED ORDER — NICOTINE 21 MG/24HR TD PT24: 21.0000 mg | MEDICATED_PATCH | Freq: Every day | TRANSDERMAL | Status: DC

## 2016-12-19 MED ORDER — ZOLPIDEM TARTRATE 5 MG PO TABS: 5.0000 mg | ORAL_TABLET | Freq: Every evening | ORAL | Status: DC | PRN

## 2016-12-19 MED ORDER — VITAMIN B-1 100 MG PO TABS: 100.0000 mg | ORAL_TABLET | Freq: Every day | ORAL | Status: DC

## 2016-12-19 MED ORDER — LORAZEPAM 1 MG PO TABS
0.0000 mg | ORAL_TABLET | Freq: Four times a day (QID) | ORAL | Status: DC
Start: 2016-12-20 — End: 2016-12-20
  Administered 2016-12-20: 1 mg via ORAL
  Filled 2016-12-19: qty 1

## 2016-12-19 MED ORDER — ACETAMINOPHEN 325 MG PO TABS: 650.0000 mg | ORAL_TABLET | ORAL | Status: DC | PRN

## 2016-12-19 NOTE — ED Triage Notes (Signed)
Pt reports he came to ER for detox from alcohol. Pt reports he did smoke a joint Wednesday but that is the only one he has smoked in five years. Pt reports he is currently drunk, last drink 2 hours ago. Pt has had 5 beers and 10 mixed liquor drinks today. PT reports he did not void for a 2 days (yesterday) but he drank a gallon of water and has peed all day today. Pt reports nausea at present.

## 2016-12-19 NOTE — ED Provider Notes (Signed)
[facility]EMER[facility]ENCY DEPT Provider Note   CSN: [id number] Arrival date & time: 12/19/16  1712  By signing my name below, I, [name], attest that this documentation has been prepared under the direction and in the presence of Gilda Crease, MD  Electronically Signed: [name], ED Scribe. 12/19/16. 11:56 PM.  History   Chief Complaint Chief Complaint  Patient presents with  . Addiction Problem  . detox    The history is provided by the patient. No language interpreter was used.   HPI Comments:  Arthur [name] is a 61 y.o. male, with a hx of alcoholism and depression, who presents to the Emergency Department complaingg of sudden onset, persistent nausea and requesting alcohol detox x today. Per triage note, pt has consumed about 15 alcoholic beverages today. Pt states he has has this issue for about a year now. He also reports he has been feeling depressed, suicidal ideations and states he does not care if he lives. Pt states he has enough medication to be able to kill himself. No alleviating factors noted. Pt denies any other associated symptoms and modifying factors at this time.    Past Medical History:  Diagnosis Date  . Alcoholism (HCC) 06/20/2016  . Bipolar disorder (HCC)   . Cough 11/17/2015  . Depression   . Diarrhea 11/25/2015  . Difficulty voiding 12/15/2015  . Dysuria 11/25/2015  . Fever, unspecified 11/17/2015  . Flank pain 11/25/2015  . History of blood transfusion 12-12-1998  . HIV infection (HCC)   . HTN (hypertension) 11/17/2016  . 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 Late syphilis   . Lower back pain 11/25/2015  . 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 TIA (transient ischemic attack) 11/17/2016    Patient Active Problem List   Diagnosis Date Noted  . TIA (transient ischemic attack) 11/17/2016  . HTN (hypertension) 11/17/2016  . 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 Alcoholism (HCC)  06/20/2016  . 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 Hypoglycemia 04/05/2016  . Difficulty voiding 12/15/2015  . Diarrhea 11/25/2015  . Flank pain 11/25/2015  . Dysuria 11/25/2015  . Lower back pain 11/25/2015  . Loss of weight 11/17/2015  . Fever, unspecified 11/17/2015  . Cough 11/17/2015  . History of alcohol abuse 08/31/2015  . Late syphilis   . Clinical depression 11/27/2014  . HIV disease (HCC) 11/12/2014  . Bipolar 1 disorder (HCC) 05/15/2012  . 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 Elevated fasting blood sugar 05/15/2012  . H/O surgical procedure 05/15/2012  . Lung mass 05/15/2012  . Bipolar I disorder (HCC) 05/15/2012  . History of surgical procedure 05/15/2012    Past Surgical History:  Procedure Laterality Date  . broken right leg Right 2000  . FRACTURE SURGERY    . GASTRIC BYPASS  2012   [facility]       Home Medications    Prior to Admission medications   Medication Sig Start Date End Date Taking? Authorizing Provider  DESCOVY 200-25 MG tablet TAKE 1 TABLET BY MOUTH DAILY 09/30/16  Yes Randall Hiss, MD  DULoxetine (CYMBALTA) 60 MG capsule Take 1 capsule (60 mg total) by mouth daily. Please take 60 mg daily. Patient taking differently: Take 90 mg by mouth daily. Please take 60 mg daily. 07/08/16  Yes Randall Hiss, MD  hydrochlorothiazide (HYDRODIURIL) 25 MG tablet Take 1 tablet (25 mg total) by mouth daily. 11/17/16  Yes Randall Hiss, MD  naproxen (NAPROSYN)  375 MG tablet Take 375 mg by mouth 2 (two) times daily as needed for mild pain.    Yes Historical Provider, MD  Pitavastatin Calcium 4 MG TABS Take 1 tablet by mouth daily. This may be placebo and is study provided. Do not dispense. 06/19/15  Yes Historical Provider, MD  sildenafil (VIAGRA) 100 MG tablet Take 1 tablet (100 mg total) by mouth daily as needed for erectile dysfunction. Come through patient assistance 05/04/16  Yes Ginnie SmartJeffrey C Hatcher, MD  tamsulosin  (FLOMAX) 0.4 MG CAPS capsule Take 1 capsule (0.4 mg total) by mouth 2 (two) times daily. 12/14/16  Yes Randall Hissornelius N Van Dam, MD  TIVICAY 50 MG tablet TAKE 1 TABLET BY MOUTH DAILY 09/30/16  Yes Randall Hissornelius N Van Dam, MD    Family History Family History  Problem Relation Age of Onset  . Heart disease Mother   . Stroke Mother   . Heart disease Father     Social History Social History  Substance Use Topics  . Smoking status: Never Smoker  . Smokeless tobacco: Never Used  . Alcohol use No     Comment: quit 01/15/15     Allergies   Patient has no known allergies.   Review of Systems Review of Systems  Gastrointestinal: Positive for nausea.  Psychiatric/Behavioral: Positive for suicidal ideas.  All other systems reviewed and are negative.    Physical Exam Updated Vital Signs BP 103/67 (BP Location: Left Arm)   Pulse 74   Temp 98.4 F (36.9 C) (Oral)   Resp 19   Ht 5' 9.5" (1.765 m)   Wt 185 lb (83.9 kg)   SpO2 99%   BMI 26.93 kg/m   Physical Exam  Constitutional: He is oriented to person, place, and time. He appears well-developed and well-nourished. No distress.  HENT:  Head: Normocephalic and atraumatic.  Right Ear: Hearing normal.  Left Ear: Hearing normal.  Nose: Nose normal.  Mouth/Throat: Oropharynx is clear and moist and mucous membranes are normal.  Eyes: Conjunctivae and EOM are normal. Pupils are equal, round, and reactive to light.  Neck: Normal range of motion. Neck supple.  Cardiovascular: Regular rhythm, S1 normal and S2 normal.  Exam reveals no gallop and no friction rub.   No murmur heard. Pulmonary/Chest: Effort normal and breath sounds normal. No respiratory distress. He exhibits no tenderness.  Abdominal: Soft. Normal appearance and bowel sounds are normal. There is no hepatosplenomegaly. There is no tenderness. There is no rebound, no guarding, no tenderness at McBurney's point and negative Murphy's sign. No hernia.  Musculoskeletal: Normal range of  motion.  Neurological: He is alert and oriented to person, place, and time. He has normal strength. No cranial nerve deficit or sensory deficit. Coordination normal. GCS eye subscore is 4. GCS verbal subscore is 5. GCS motor subscore is 6.  Skin: Skin is warm, dry and intact. No rash noted. No cyanosis.  Psychiatric: His speech is normal. He exhibits a depressed mood.  Expresses hopelessness and reports that he does not care if he lives. Reports that he has enough medications to kill himself.   Nursing note and vitals reviewed.   ED Treatments / Results  DIAGNOSTIC STUDIES:  Oxygen Saturation is 99% on RA, normal by my interpretation.    COORDINATION OF CARE:  11:51 PM Discussed treatment plan with pt at bedside and pt agreed to plan.  Labs (all labs ordered are listed, but only abnormal results are displayed) Labs Reviewed  COMPREHENSIVE METABOLIC PANEL - Abnormal; Notable  for the following:       Result Value   CO2 20 (*)    All other components within normal limits  ETHANOL - Abnormal; Notable for the following:    Alcohol, Ethyl (B) 143 (*)    All other components within normal limits  CBC - Abnormal; Notable for the following:    RBC 3.86 (*)    Hemoglobin 12.3 (*)    HCT 35.8 (*)    All other components within normal limits  URINALYSIS, ROUTINE W REFLEX MICROSCOPIC - Abnormal; Notable for the following:    Color, Urine STRAW (*)    All other components within normal limits  RAPID URINE DRUG SCREEN, HOSP PERFORMED    EKG  EKG Interpretation None       Radiology No results found.  Procedures Procedures (including critical care time)  Medications Ordered in ED Medications  ondansetron (ZOFRAN-ODT) 4 MG disintegrating tablet (not administered)  LORazepam (ATIVAN) tablet 0-4 mg (not administered)    Followed by  LORazepam (ATIVAN) tablet 0-4 mg (not administered)  thiamine (VITAMIN B-1) tablet 100 mg (not administered)    Or  thiamine (B-1) injection 100 mg  (not administered)  alum & mag hydroxide-simeth (MAALOX/MYLANTA) 200-200-20 MG/5ML suspension 30 mL (not administered)  ondansetron (ZOFRAN) tablet 4 mg (not administered)  zolpidem (AMBIEN) tablet 5 mg (not administered)  acetaminophen (TYLENOL) tablet 650 mg (not administered)  nicotine (NICODERM CQ - dosed in mg/24 hours) patch 21 mg (not administered)  ondansetron (ZOFRAN-ODT) disintegrating tablet 4 mg (4 mg Oral Given 12/19/16 1744)     Initial Impression / Assessment and Plan / ED Course  I have reviewed the triage vital signs and the nursing notes.  Pertinent labs & imaging results that were available during my care of the patient were reviewed by me and considered in my medical decision making (see chart for details).  Clinical Course    Patient presents with complaints of long term alcoholism. He talked to a detox today, was referred to the emergency departments to rule out withdrawal. He has had 15 drinks today. Patient reports that he is slightly nauseated, but his examination is unremarkable. No tachycardia or tremors. He is alert and oriented. No sign of DTs at this time.  Discussion with the patient reveals that he is feeling helpless and hopeless. He initially told me he did not care if he lived, but then commented that he had enough meds in his back currently to kill himself. This is at least a passive death wish of not active suicidal ideation. Will therefore have behavioral health talk with him.  Final Clinical Impressions(s) / ED Diagnoses   Final diagnoses:  Alcohol abuse  Depression, unspecified depression type    New Prescriptions New Prescriptions   No medications on file  I personally performed the services described in this documentation, which was scribed in my presence. The recorded information has been reviewed and is accurate.     Gilda Crease, MD 12/20/16 [phone]

## 2016-12-19 NOTE — ED Notes (Signed)
Pt currently crying. Reports he is embarrassed. Denies SI/HI.

## 2016-12-20 ENCOUNTER — Encounter (HOSPITAL_COMMUNITY): Payer: Self-pay | Admitting: *Deleted

## 2016-12-20 ENCOUNTER — Observation Stay (HOSPITAL_COMMUNITY)
Admission: EM | Admit: 2016-12-20 | Discharge: 2016-12-21 | Disposition: A | Discharge status: Home / Self Care | Payer: No Typology Code available for payment source | Source: Intra-hospital | Attending: Psychiatry | Admitting: Psychiatry

## 2016-12-20 DIAGNOSIS — F319 Bipolar disorder, unspecified: Secondary | ICD-10-CM

## 2016-12-20 DIAGNOSIS — F332 Major depressive disorder, recurrent severe without psychotic features: Secondary | ICD-10-CM | POA: Insufficient documentation

## 2016-12-20 DIAGNOSIS — Z9884 Bariatric surgery status: Secondary | ICD-10-CM | POA: Insufficient documentation

## 2016-12-20 DIAGNOSIS — R45851 Suicidal ideations: Secondary | ICD-10-CM | POA: Insufficient documentation

## 2016-12-20 DIAGNOSIS — N529 Male erectile dysfunction, unspecified: Secondary | ICD-10-CM | POA: Insufficient documentation

## 2016-12-20 DIAGNOSIS — Z8673 Personal history of transient ischemic attack (TIA), and cerebral infarction without residual deficits: Secondary | ICD-10-CM | POA: Insufficient documentation

## 2016-12-20 DIAGNOSIS — N400 Enlarged prostate without lower urinary tract symptoms: Secondary | ICD-10-CM | POA: Insufficient documentation

## 2016-12-20 DIAGNOSIS — Z21 Asymptomatic human immunodeficiency virus [HIV] infection status: Secondary | ICD-10-CM | POA: Insufficient documentation

## 2016-12-20 DIAGNOSIS — F10229 Alcohol dependence with intoxication, unspecified: Secondary | ICD-10-CM | POA: Insufficient documentation

## 2016-12-20 DIAGNOSIS — I10 Essential (primary) hypertension: Secondary | ICD-10-CM | POA: Insufficient documentation

## 2016-12-20 DIAGNOSIS — F1024 Alcohol dependence with alcohol-induced mood disorder: Principal | ICD-10-CM | POA: Insufficient documentation

## 2016-12-20 DIAGNOSIS — F1094 Alcohol use, unspecified with alcohol-induced mood disorder: Secondary | ICD-10-CM | POA: Diagnosis present

## 2016-12-20 DIAGNOSIS — Z9889 Other specified postprocedural states: Secondary | ICD-10-CM

## 2016-12-20 DIAGNOSIS — Z8249 Family history of ischemic heart disease and other diseases of the circulatory system: Secondary | ICD-10-CM

## 2016-12-20 DIAGNOSIS — Z823 Family history of stroke: Secondary | ICD-10-CM

## 2016-12-20 MED ORDER — LORAZEPAM 1 MG PO TABS
1.0000 mg | ORAL_TABLET | Freq: Three times a day (TID) | ORAL | Status: DC
  Administered 2016-12-21: 1 mg via ORAL
  Filled 2016-12-20: qty 1

## 2016-12-20 MED ORDER — ALUM & MAG HYDROXIDE-SIMETH 200-200-20 MG/5ML PO SUSP: 30.0000 mL | ORAL | Status: DC | PRN

## 2016-12-20 MED ORDER — TAMSULOSIN HCL 0.4 MG PO CAPS
0.4000 mg | ORAL_CAPSULE | Freq: Two times a day (BID) | ORAL | Status: DC
  Administered 2016-12-20 – 2016-12-21 (×2): 0.4 mg via ORAL
  Filled 2016-12-20 (×5): qty 1

## 2016-12-20 MED ORDER — LORAZEPAM 1 MG PO TABS
1.0000 mg | ORAL_TABLET | Freq: Four times a day (QID) | ORAL | Status: DC | PRN
  Filled 2016-12-20: qty 1

## 2016-12-20 MED ORDER — LORAZEPAM 1 MG PO TABS: 1.0000 mg | ORAL_TABLET | Freq: Every day | ORAL | Status: DC

## 2016-12-20 MED ORDER — ONDANSETRON 4 MG PO TBDP: 4.0000 mg | ORAL_TABLET | Freq: Four times a day (QID) | ORAL | Status: DC | PRN

## 2016-12-20 MED ORDER — EMTRICITABINE-TENOFOVIR AF 200-25 MG PO TABS
1.0000 | ORAL_TABLET | Freq: Every day | ORAL | Status: DC
  Administered 2016-12-20 – 2016-12-21 (×2): 1 via ORAL
  Filled 2016-12-20 (×3): qty 1

## 2016-12-20 MED ORDER — TAMSULOSIN HCL 0.4 MG PO CAPS: 0.4000 mg | ORAL_CAPSULE | Freq: Two times a day (BID) | ORAL | Status: DC

## 2016-12-20 MED ORDER — THIAMINE HCL 100 MG/ML IJ SOLN
100.0000 mg | Freq: Once | INTRAMUSCULAR | Status: AC
  Administered 2016-12-20: 100 mg via INTRAMUSCULAR
  Filled 2016-12-20: qty 2

## 2016-12-20 MED ORDER — HYDROXYZINE HCL 25 MG PO TABS
25.0000 mg | ORAL_TABLET | Freq: Four times a day (QID) | ORAL | Status: DC | PRN
  Administered 2016-12-20: 25 mg via ORAL
  Filled 2016-12-20: qty 1

## 2016-12-20 MED ORDER — LOPERAMIDE HCL 2 MG PO CAPS
2.0000 mg | ORAL_CAPSULE | ORAL | Status: DC | PRN
Start: 2016-12-20 — End: 2016-12-21

## 2016-12-20 MED ORDER — LORAZEPAM 1 MG PO TABS
1.0000 mg | ORAL_TABLET | Freq: Four times a day (QID) | ORAL | Status: AC
  Administered 2016-12-20 (×4): 1 mg via ORAL
  Filled 2016-12-20 (×3): qty 1

## 2016-12-20 MED ORDER — TRAZODONE HCL 50 MG PO TABS
50.0000 mg | ORAL_TABLET | Freq: Every evening | ORAL | Status: DC | PRN
  Administered 2016-12-20: 50 mg via ORAL
  Filled 2016-12-20: qty 1

## 2016-12-20 MED ORDER — VITAMIN B-1 100 MG PO TABS
100.0000 mg | ORAL_TABLET | Freq: Every day | ORAL | Status: DC
  Administered 2016-12-21: 100 mg via ORAL
  Filled 2016-12-20: qty 1

## 2016-12-20 MED ORDER — LORAZEPAM 1 MG PO TABS: 1.0000 mg | ORAL_TABLET | Freq: Two times a day (BID) | ORAL | Status: DC

## 2016-12-20 MED ORDER — DOLUTEGRAVIR SODIUM 50 MG PO TABS
50.0000 mg | ORAL_TABLET | Freq: Every day | ORAL | Status: DC
  Administered 2016-12-20 – 2016-12-21 (×2): 50 mg via ORAL
  Filled 2016-12-20 (×3): qty 1

## 2016-12-20 MED ORDER — ACETAMINOPHEN 325 MG PO TABS
650.0000 mg | ORAL_TABLET | Freq: Four times a day (QID) | ORAL | Status: DC | PRN
  Filled 2016-12-20: qty 2

## 2016-12-20 MED ORDER — ADULT MULTIVITAMIN W/MINERALS CH
1.0000 | ORAL_TABLET | Freq: Every day | ORAL | Status: DC
  Administered 2016-12-20 – 2016-12-21 (×2): 1 via ORAL
  Filled 2016-12-20 (×2): qty 1

## 2016-12-20 MED ORDER — MAGNESIUM HYDROXIDE 400 MG/5ML PO SUSP: 30.0000 mL | Freq: Every day | ORAL | Status: DC | PRN

## 2016-12-20 NOTE — ED Notes (Addendum)
Pt placed in wine scrubs. Pt belongings removed from room. Sitter at bedside.

## 2016-12-20 NOTE — Progress Notes (Addendum)
Per Karleen HampshireSpencer, [place] meets criteria for OBS. Recommend OBS. Per Estell Harpinorie, AC [facility]. Patient assigned OBS bed # 4. Shatara Stanek K. Sherlon HandingHarris, LCAS-A, LPC-A, [facility]  Counselor 12/20/2016 1:37 AM
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
## 2016-12-20 NOTE — Consult Note (Deleted)
[facility] OBS UNIT H&P   Patient Identification: Justyn Langham MRN:  [id number] Principal Diagnosis: Alcohol-induced mood disorder [facility]) Diagnosis:   Patient Active Problem List   Diagnosis Date Noted  . Alcohol-induced mood disorder ([place]) [F10.94] 12/20/2016    Priority: High  . TIA (transient ischemic attack) [G45.9] 11/17/2016  . HTN (hypertension) [I10] 11/17/2016  . 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 Alcoholism ([place]) [F10.20] 06/20/2016  . Recurrent major depression-severe ([place]) [F33.2] 06/20/2016  . Hypoglycemia [E16.2] 04/05/2016  . Difficulty voiding [R39.198] 12/15/2015  . Diarrhea [R19.7] 11/25/2015  . Flank pain [R10.9] 11/25/2015  . Dysuria [R30.0] 11/25/2015  . Lower back pain [M54.5] 11/25/2015  . Loss of weight [R63.4] 11/17/2015  . Fever, unspecified [R50.9] 11/17/2015  . Cough [R05] 11/17/2015  . History of alcohol abuse [Z87.898] 08/31/2015  . 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 Clinical depression [F32.9] 11/27/2014  . HIV disease ([place]) [B20] 11/12/2014  . Bipolar 1 disorder ([place]) [F31.9] 05/15/2012  . 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 Elevated fasting blood sugar [R73.01] 05/15/2012  . H/O surgical procedure [Z98.890] 05/15/2012  . Lung mass [R91.8] 05/15/2012  . Bipolar I disorder ([place]) [F31.9] 05/15/2012  . History of surgical procedure [Z98.890] 05/15/2012    Total Time spent with patient: 30 minutes  Subjective:   Gracen Southwell is a 61 y.o. male patient admitted with reports of alcohol abuse and depression. Pt had passive suicidal thoughts without plan and is improving today somewhat. He has been here since early this morning. Today, early, staff helped him set up referrals with [facility]. Pt will continue on CIWA for stabilization and will discharge tomorrow morning.   HPI:  I have reviewed and concur with HPI elements below, modified as follows:  "Zyeir Dymek is an 61 y.o. male, Caucasian, single who  presents to [facility] ED per ED report: hx of alcoholism and depression,who presents to the Emergency Department complaingg of sudden onset, persistent nausea and requesting alcohol detox x today. Per triage note, pt has consumed about 15 alcoholic beverages today. Pt states he has has this issue for about a year now. He also reports he has been feeling depressed, suicidal ideations and states he does not care if he lives. Pt states he has enough medication to be able to kill himself. No alleviating factors noted. Pt denies any other associated symptoms and modifying factors at this time. Patient states that primary concern is of depression and increased ETOH. Patient states that sponsor brought him to the ED. Patient states that he does have SI thoughts and plan was to o.d. On HIV medications with other passive plans to drink self into intoxication of lethal levels. Patient upon assessment states that he is able to contract for safety and stated when asked about med compliance has been taking HIV meds as prescribed and been compliant. Patient states he does at this  point have SI and plan related to [facility]. With alcohol intoxication. Patient states he does have support channels but in [place] area prefers not have to leave that area. Patient states he does live alone, has A.A. Sponsor, some support from other areas including [facility] [[facility]]. Patient states he has had decrease in sleep with off and on sleep with at most x 4 hours sleep per night. Patient states since HIV status has started drinking more heavily. Patient acknowledges current SI, plan per pt. Currently to drink to dangerous levels [prior to assessment was to o.d. On  HIV medications]. Patient at present states contracts for safety. Patient denies HI and AVH. Patient acknowledges S.A. Hx with last consumption today with x 5 beers and x 10 mixed drinks and is drinking daily various amounts. Patient denies hx. Of inpatient  psych care. Patient denies hx. Of outpatient psych care but states he does have x 1 person he trusts at [place] whom he would like to start seeing for therapy.  Pt arrived at OBS without incident. Seen today on 12/20/2016 as above. He reports that he is suicidal but no longer has a plan.   Past Psychiatric History: ETOH  Risk to Self: Is patient at risk for suicide?: Yes Risk to Others:   Prior Inpatient Therapy:   Prior Outpatient Therapy:    Past Medical History:  Past Medical History:  Diagnosis Date  . Alcoholism ([place]) 06/20/2016  . Bipolar disorder ([place])   . Cough 11/17/2015  . Depression   . Diarrhea 11/25/2015  . Difficulty voiding 12/15/2015  . Dysuria 11/25/2015  . Fever, unspecified 11/17/2015  . Flank pain 11/25/2015  . History of blood transfusion 12-12-1998  . HIV infection ([place])   . HTN (hypertension) 11/17/2016  . 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 Late syphilis   . Lower back pain 11/25/2015  . Recurrent major depression-severe ([place]) 06/20/2016  . TIA (transient ischemic attack) 11/17/2016    Past Surgical History:  Procedure Laterality Date  . broken right leg Right 2000  . FRACTURE SURGERY    . GASTRIC BYPASS  2012   [facility]   Family History:  Family History  Problem Relation Age of Onset  . Heart disease Mother   . Stroke Mother   . Heart disease Father    Family Psychiatric  History: denies Social History:  History  Alcohol Use  . 50.4 oz/week  . 48 Cans of beer per week    Comment: quit 01/15/15     History  Drug Use No    Social History   Social History  . Marital status: Single    Spouse name: N/A  . Number of children: N/A  . Years of education: N/A   Social History Main Topics  . Smoking status: Never Smoker  . Smokeless tobacco: Never Used  . Alcohol use 50.4 oz/week    84 Cans of beer per week     Comment: quit 01/15/15  . Drug use: No  . Sexual activity: Not  Currently    Partners: Male   Other Topics Concern  . None   Social History Narrative  . None   Additional Social History:    Allergies:  No Known Allergies  Labs:  Results for orders placed or performed during the hospital encounter of 12/19/16 (from the past 48 hour(s))  Comprehensive metabolic panel     Status: Abnormal   Collection Time: 12/19/16  5:42 PM  Result Value Ref Range   Sodium 135 135 - 145 mmol/L   Potassium 3.9 3.5 - 5.1 mmol/L   Chloride 101 101 - 111 mmol/L   CO2 20 (L) 22 - 32 mmol/L   Glucose, Bld 90 65 - 99 mg/dL   BUN 11 6 - 20 mg/dL   Creatinine, Ser 1.01 0.61 - 1.24 mg/dL   Calcium 9.0 8.9 - 10.3 mg/dL   Total Protein 6.5 6.5 - 8.1 g/dL   Albumin 4.1 3.5 - 5.0 g/dL   AST 32 15 - 41 U/L   ALT 20  17 - 63 U/L   Alkaline Phosphatase 57 38 - 126 U/L   Total Bilirubin 0.8 0.3 - 1.2 mg/dL   GFR calc non Af Amer >60 >60 mL/min   GFR calc Af Amer >60 >60 mL/min    Comment: (NOTE) The eGFR has been calculated using the CKD EPI equation. This calculation has not been validated in all clinical situations. eGFR's persistently <60 mL/min signify possible Chronic Kidney Disease.    Anion gap 14 5 - 15  cbc     Status: Abnormal   Collection Time: 12/19/16  5:42 PM  Result Value Ref Range   WBC 8.8 4.0 - 10.5 K/uL   RBC 3.86 (L) 4.22 - 5.81 MIL/uL   Hemoglobin 12.3 (L) 13.0 - 17.0 g/dL   HCT 35.8 (L) 39.0 - 52.0 %   MCV 92.7 78.0 - 100.0 fL   MCH 31.9 26.0 - 34.0 pg   MCHC 34.4 30.0 - 36.0 g/dL   RDW 13.3 11.5 - 15.5 %   Platelets 188 150 - 400 K/uL  Ethanol     Status: Abnormal   Collection Time: 12/19/16  6:03 PM  Result Value Ref Range   Alcohol, Ethyl (B) 143 (H) <5 mg/dL    Comment:        LOWEST DETECTABLE LIMIT FOR SERUM ALCOHOL IS 5 mg/dL FOR MEDICAL PURPOSES ONLY   Rapid urine drug screen (hospital performed)     Status: None   Collection Time: 12/19/16  6:08 PM  Result Value Ref Range   Opiates NONE DETECTED NONE DETECTED   Cocaine  NONE DETECTED NONE DETECTED   Benzodiazepines NONE DETECTED NONE DETECTED   Amphetamines NONE DETECTED NONE DETECTED   Tetrahydrocannabinol NONE DETECTED NONE DETECTED   Barbiturates NONE DETECTED NONE DETECTED    Comment:        DRUG SCREEN FOR MEDICAL PURPOSES ONLY.  IF CONFIRMATION IS NEEDED FOR ANY PURPOSE, NOTIFY LAB WITHIN 5 DAYS.        LOWEST DETECTABLE LIMITS FOR URINE DRUG SCREEN Drug Class       Cutoff (ng/mL) Amphetamine      1000 Barbiturate      200 Benzodiazepine   001 Tricyclics       749 Opiates          300 Cocaine          300 THC              50   Urinalysis, Routine w reflex microscopic     Status: Abnormal   Collection Time: 12/19/16  6:08 PM  Result Value Ref Range   Color, Urine STRAW (A) YELLOW   APPearance CLEAR CLEAR   Specific Gravity, Urine 1.005 1.005 - 1.030   pH 5.0 5.0 - 8.0   Glucose, UA NEGATIVE NEGATIVE mg/dL   Hgb urine dipstick NEGATIVE NEGATIVE   Bilirubin Urine NEGATIVE NEGATIVE   Ketones, ur NEGATIVE NEGATIVE mg/dL   Protein, ur NEGATIVE NEGATIVE mg/dL   Nitrite NEGATIVE NEGATIVE   Leukocytes, UA NEGATIVE NEGATIVE    Current Facility-Administered Medications  Medication Dose Route Frequency Provider Last Rate Last Dose  . acetaminophen (TYLENOL) tablet 650 mg  650 mg Oral Q6H PRN Laverle Hobby, PA-C      . alum & mag hydroxide-simeth (MAALOX/MYLANTA) 200-200-20 MG/5ML suspension 30 mL  30 mL Oral Q4H PRN Laverle Hobby, PA-C      . hydrOXYzine (ATARAX/VISTARIL) tablet 25 mg  25 mg Oral Q6H PRN Laverle Hobby, PA-C  25 mg at 12/20/16 0507  . loperamide (IMODIUM) capsule 2-4 mg  2-4 mg Oral PRN Laverle Hobby, PA-C      . LORazepam (ATIVAN) tablet 1 mg  1 mg Oral Q6H PRN Laverle Hobby, PA-C      . LORazepam (ATIVAN) tablet 1 mg  1 mg Oral QID Laverle Hobby, PA-C   1 mg at 12/20/16 0900   Followed by  . [START ON 12/21/2016] LORazepam (ATIVAN) tablet 1 mg  1 mg Oral TID Laverle Hobby, PA-C       Followed by  . [START ON  12/22/2016] LORazepam (ATIVAN) tablet 1 mg  1 mg Oral BID Laverle Hobby, PA-C       Followed by  . [START ON 12/23/2016] LORazepam (ATIVAN) tablet 1 mg  1 mg Oral Daily Spencer E Simon, PA-C      . magnesium hydroxide (MILK OF MAGNESIA) suspension 30 mL  30 mL Oral Daily PRN Laverle Hobby, PA-C      . multivitamin with minerals tablet 1 tablet  1 tablet Oral Daily Laverle Hobby, PA-C   1 tablet at 12/20/16 0900  . ondansetron (ZOFRAN-ODT) disintegrating tablet 4 mg  4 mg Oral Q6H PRN Laverle Hobby, PA-C      . [START ON 12/21/2016] thiamine (VITAMIN B-1) tablet 100 mg  100 mg Oral Daily Spencer E Simon, PA-C      . traZODone (DESYREL) tablet 50 mg  50 mg Oral QHS,MR X 1 Laverle Hobby, PA-C        Musculoskeletal: Strength & Muscle Tone: within normal limits Gait & Station: normal Patient leans: N/A  Psychiatric Specialty Exam: Physical Exam  Review of Systems  Psychiatric/Behavioral: Positive for depression, substance abuse and suicidal ideas. The patient is nervous/anxious and has insomnia.   All other systems reviewed and are negative.   Blood pressure (!) 96/57, pulse 79, temperature 98.9 F (37.2 C), temperature source Oral, resp. rate 18, height _0  (1.753 m), weight 83.9 kg (185 lb), SpO2 99 %.Body mass index is 27.32 kg/m.  General Appearance: Casual and Fairly Groomed  Eye Contact:  Good  Speech:  Clear and Coherent and Normal Rate  Volume:  Normal  Mood:  Anxious and Depressed  Affect:  Appropriate, Congruent and Depressed  Thought Process:  Coherent, Goal Directed, Linear and Descriptions of Associations: Intact  Orientation:  Full (Time, Place, and Person)  Thought Content:  Symptoms, worries, concerns  Suicidal Thoughts:  Yes.  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

## 2016-12-20 NOTE — [facility] Counselor (Signed)
This Clinical research associatewriter [facility] on Wednesday, January, 10, 2018 @ 3:30pm with Roderic ScarceKenneth Shore (LCSW). This pt was made aware of this appointment.

## 2016-12-20 NOTE — Tx Team (Signed)
Initial Treatment Plan 12/20/2016 5:16 AM Arthur LackWesley Stoy AVW:[id number]    PATIENT STRESSORS: Health problems Substance abuse Traumatic event   PATIENT STRENGTHS: Ability for insight Average or above average intelligence Capable of independent living Communication skills General fund of knowledge Religious Affiliation   PATIENT IDENTIFIED PROBLEMS: Depression  Substance Abuse  Suicide Ideation                 DISCHARGE CRITERIA:  Ability to meet basic life and health needs Improved stabilization in mood, thinking, and/or behavior Motivation to continue treatment in a less acute level of care Verbal commitment to aftercare and medication compliance  PRELIMINARY DISCHARGE PLAN: Attend aftercare/continuing care group Outpatient therapy Return to previous living arrangement  PATIENT/FAMILY INVOLVEMENT: This treatment plan has been presented to and reviewed with the patient, Arthur [name], and/or family member. The patient and family have been given the opportunity to ask questions and make suggestions.  Earleen NewportIbekwe, Glen Blatchley B, RN 12/20/2016, 5:16 AM

## 2016-12-20 NOTE — ED Notes (Signed)
TTS in progress 

## 2016-12-20 NOTE — Progress Notes (Signed)
[facility] INPATIENT:  Family/Significant Other Suicide Prevention Education  Suicide Prevention Education:  Patient Refusal for Family/Significant Other Suicide Prevention Education: The patient Arthur [name] has refused to provide written consent for family/significant other to be provided Family/Significant Other Suicide Prevention Education during admission and/or prior to discharge.  Physician notified.  Glenice LaineIbekwe, Weber Monnier B 12/20/2016, 5:16 AM

## 2016-12-20 NOTE — Progress Notes (Signed)
Pt received this am alert and cooperative. States that he came in with passive SI as well as drinking an excess amount of alcohol. Pt's CIWA is a 3 with no s/s  ETOH withdrawals at this time.At this time pt denies SI/HI/AVH. Verbalized a desire to get help for substance abuse. Pt appears to have good insight into his current situation. Verbalized no complaints at this time.

## 2016-12-20 NOTE — Progress Notes (Signed)
D: Patient verbalizes no concern. Reports his day was "good". Denies pain, SI/HI, AH/VH at this time. No behavioral issues noted. A: Staff encouraged patient to continue with the treatment plan and verbalize needs to staff. Due meds given as ordered. Constant monitoring/observation maintained for safety except when patient is in the bathroom. Will continue to monitor patient for safety.  R: Patient remains safe and appropriate.

## 2016-12-20 NOTE — [facility] Counselor (Signed)
This Clinical research associatewriter coordinated a follow up appointment @ [facility] for this pt with his Counselor Alona Bene(Joyce) on Wednesday, December 21, 2016 @ 12:30pm. This appointment was confirmed by Efraim KaufmannMelissa (Supervisor @[facility] ). This pt was made aware of this appointment and was receptive.
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
## 2016-12-21 ENCOUNTER — Ambulatory Visit: Payer: Self-pay

## 2016-12-21 DIAGNOSIS — F1010 Alcohol abuse, uncomplicated: Secondary | ICD-10-CM

## 2016-12-21 DIAGNOSIS — F4310 Post-traumatic stress disorder, unspecified: Secondary | ICD-10-CM

## 2016-12-21 HISTORY — DX: Alcohol abuse, uncomplicated: F10.10

## 2016-12-21 NOTE — Discharge Planning (Signed)
[facility] :  Will you be returning to the same living situation after discharge:  Yes  At discharge, do you have transportation home?: Yes  Do you have the ability to pay for your medications: He states he gets his medications through a special program for HIV patients    Patient to Follow up at: Follow-up Information    [facility] Go in 1 day(s).   Specialty:  Behavioral Health Why:  Pt has an appoinment with his counselor @12 :30pm  on the above mentioned date. Contact information: [address] [place] [address] [phone]           Safety Planning and Suicide Prevention discussed: Yes,  Patient verbalized understanding   Camelia EngKaren H Jailen Lung 12/21/2016, 9:15 AM

## 2016-12-21 NOTE — Discharge Summary (Signed)
Physician Discharge Summary Note  Patient:  Arthur [name] is an 61 y.o., male MRN:  [id number] DOB:  18-May-1956 Patient phone:  [phone] (home)  Patient address:   [address] [place] [id number],  Total Time spent with patient: 30 minutes  Date of Admission:  12/20/2016 Date of Discharge: 12/21/2016  Reason for Admission:  "Arthur HammersWesley Tayloris an 61 y.o.male, Caucasian, single who [facility] per ED report: hx of alcoholism and depression,who presents to the Emergency Department complaingg of sudden onset, persistent nausea and requesting alcohol detox x today. Per triage note, pt has consumed about 15 alcoholic beverages today. Pt states he has has this issue for about a year now. He also reports he has been feeling depressed, suicidal ideations and states he does not care if he lives. Pt states he has enough medication to be able to kill himself. No alleviating factors noted. Pt denies any other associated symptoms and modifying factors at this time. Patient states that primary concern is of depression and increased ETOH. Patient states that sponsor brought him to the ED. Patient states that he does have SI thoughts and plan was to o.d. On HIV medications with other passive plans to drink self into intoxication of lethal levels. Patient upon assessment states that he is able to contract for safety and stated when asked about med compliance has been taking HIV meds as prescribed and been compliant. Patient states he does at this point have SI and plan related to [facility].A. With alcohol intoxication. Patient states he does have support channels but in [place] prefers not have to leave that area. Patient states he does live alone, has A.A. Sponsor, some support from other areas including [facility] [[facility]]. Patient states he has had decrease in sleep with off and on sleep with at most x 4 hours sleep per night. Patient states since HIV status has  started drinking more heavily. Patient acknowledges current SI, plan per pt. Currently to drink to dangerous levels [prior to assessment was to o.d. On HIV medications]. Patient at present states contracts for safety. Patient denies HI and AVH. Patient acknowledges S.A. Hx with last consumption today with x 5 beers and x 10 mixed drinks and is drinking daily various amounts. Patient denies hx. Of inpatient psych care. Patient denies hx. Of outpatient psych care but states he does have x 1 person he trusts [name] whom he would like to start seeing for therapy.  Principal Problem: Alcohol-induced mood disorder [name](HCC) Discharge Diagnoses: Patient Active Problem List   Diagnosis Date Noted  . Alcohol-induced mood disorder (HCC) [F10.94] 12/20/2016  . TIA (transient ischemic attack) [G45.9] 11/17/2016  . HTN (hypertension) [I10] 11/17/2016  . 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 Alcoholism (HCC) [F10.20] 06/20/2016  . 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 Hypoglycemia [E16.2] 04/05/2016  . Difficulty voiding [R39.198] 12/15/2015  . Diarrhea [R19.7] 11/25/2015  . Flank pain [R10.9] 11/25/2015  . Dysuria [R30.0] 11/25/2015  . Lower back pain [M54.5] 11/25/2015  . Loss of weight [R63.4] 11/17/2015  . Fever, unspecified [R50.9] 11/17/2015  . Cough [R05] 11/17/2015  . History of alcohol abuse [Z87.898] 08/31/2015  . 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 Clinical depression [F32.9] 11/27/2014  . HIV disease (HCC) [B20] 11/12/2014  . Bipolar 1 disorder (HCC) [F31.9] 05/15/2012  . 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 Elevated fasting blood sugar [R73.01] 05/15/2012  .  H/O surgical procedure [Z98.890] 05/15/2012  . Lung mass [R91.8] 05/15/2012  . Bipolar I disorder (HCC) [F31.9] 05/15/2012  . History of surgical procedure [Z98.890] 05/15/2012    Past Psychiatric History:  Alcohol Induced mood disorder, Recurrent major-depression severe, Bipolar 1 disorder  Past Medical History:  Past Medical History:  Diagnosis Date  . Alcoholism (HCC) 06/20/2016  . Bipolar disorder (HCC)   . Cough 11/17/2015  . Depression   . Diarrhea 11/25/2015  . Difficulty voiding 12/15/2015  . Dysuria 11/25/2015  . Fever, unspecified 11/17/2015  . Flank pain 11/25/2015  . History of blood transfusion 12-12-1998  . HIV infection (HCC)   . HTN (hypertension) 11/17/2016  . 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 Late syphilis   . Lower back pain 11/25/2015  . 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 TIA (transient ischemic attack) 11/17/2016    Past Surgical History:  Procedure Laterality Date  . broken right leg Right 2000  . FRACTURE SURGERY    . GASTRIC BYPASS  2012   [facility]   Family History:  Family History  Problem Relation Age of Onset  . Heart disease Mother   . Stroke Mother   . Heart disease Father    Family Psychiatric  History: Denies Social History:  History  Alcohol Use  . 50.4 oz/week  . 84 Cans of beer per week    Comment: quit 01/15/15     History  Drug Use No    Social History   Social History  . Marital status: Single    Spouse name: N/A  . Number of children: N/A  . Years of education: N/A   Social History Main Topics  . Smoking status: Never Smoker  . Smokeless tobacco: Never Used  . Alcohol use 50.4 oz/week    84 Cans of beer per week     Comment: quit 01/15/15  . Drug use: No  . Sexual activity: Not Currently    Partners: Male   Other Topics Concern  . None   Social History Narrative  . None    Hospital Course:  Patient is a 61 year old male diagnosed with bipolar disorder, manic without psychosis, depression, alcohol induced mood disorder who presented to [facility] ED requesting alcohol detox.  Arthur [name] was admitted to the hospital with his BAL was 143. He does have some alcohol problems. He was  intoxicated on his admission & was having bad alcohol withdrawal symptoms; tremors, sweating, irritability, agitation etc. However, he was admitted to our observation unit for mood and crisis stabilization treatment. After evaluation of his symptoms, Keene was started on medication regimen for his presenting symptoms. He was started on Ativan detox protocol to minimize risk of ETOH withdrawal symptoms.Besides the detoxification treatments, Ermal was medicated with Hydroxyzine 25 mg tid prn as needed for anxiety and Trazodone 50 mg Q bedtime for sleep. He presented no other medical concerns  that required immediate treatment and or monitoring. He tolerated his treatment regimen without any significant adverse effects and or reactions.   Kamarian has completed detox treatment and his mood is stable. This is evidenced by his reports of improved mood and absence of withdrawal symptoms. He is currently being discharged to continue psychiatric and medical treatment, medication management and routine psychiatric care. Upon discharge, he adamantly denies any SIHI, AVH, delusional thoughts, paranoia and or withdrawal symptoms. Transportation is provided by his AA sponsor. He left [facility] in no apparent distress with all belongings.  Physical Findings: AIMS: Facial and Oral  Movements Muscles of Facial Expression: None, normal Lips and Perioral Area: None, normal Jaw: None, normal Tongue: None, normal,Extremity Movements Upper (arms, wrists, hands, fingers): None, normal Lower (legs, knees, ankles, toes): None, normal, Trunk Movements Neck, shoulders, hips: None, normal, Overall Severity Severity of abnormal movements (highest score from questions above): None, normal Incapacitation due to abnormal movements: None, normal Patient's awareness of abnormal movements (rate only patient's report): No Awareness, Dental Status Current problems with teeth and/or dentures?: No Does patient usually wear dentures?: No   CIWA:  CIWA-Ar Total: 2 COWS:     Musculoskeletal: Strength & Muscle Tone: within normal limits Gait & Station: normal Patient leans: N/A  Psychiatric Specialty Exam: Physical Exam  ROS  Blood pressure 98/64, pulse 68, temperature 98.4 F (36.9 C), temperature source Oral, resp. rate 18, height 5\' 9"  (1.753 m), weight 83.9 kg (185 lb), SpO2 99 %.Body mass index is 27.32 kg/m.  General Appearance: Fairly Groomed  Eye Contact:  Fair  Speech:  Clear and Coherent and Normal Rate  Volume:  Normal  Mood:  Euthymic  Affect:  Appropriate and Congruent  Thought Process:  Goal Directed  Orientation:  Full (Time, Place, and Person)  Thought Content:  Logical  Suicidal Thoughts:  No  Homicidal Thoughts:  No  Memory:  Immediate;   Fair Recent;   Good Remote;   Good  Judgement:  Intact  Insight:  Present  Psychomotor Activity:  Normal  Concentration:  Concentration: Fair and Attention Span: Fair  Recall:  [employer] of Knowledge:  Fair  Language:  Fair  Akathisia:  No  Handed:  Right  AIMS (if indicated):     Assets:  Communication Skills Desire for Improvement Financial Resources/Insurance Housing Physical Health Social Support  ADL's:  Intact  Cognition:  WNL  Sleep:      Have you used any form of tobacco in the last 30 days? (Cigarettes, Smokeless Tobacco, Cigars, and/or Pipes): No  Has this patient used any form of tobacco in the last 30 days? (Cigarettes, Smokeless Tobacco, Cigars, and/or Pipes)  No  Blood Alcohol level:  Lab Results  Component Value Date   ETH 143 (H) 12/19/2016    Metabolic Disorder Labs:  Lab Results  Component Value Date   HGBA1C 5.4 10/10/2016   No results found for: PROLACTIN Lab Results  Component Value Date   CHOL 120 (L) 05/25/2016   TRIG 52 05/25/2016   HDL 63 05/25/2016   CHOLHDL 1.9 05/25/2016   VLDL 10 05/25/2016   LDLCALC 47 05/25/2016   LDLCALC 62 10/19/2015    See Psychiatric Specialty Exam and Suicide Risk Assessment  completed by Attending Physician prior to discharge.  Discharge destination:  Home  Is patient on multiple antipsychotic therapies at discharge:  No   Has Patient had three or more failed trials of antipsychotic monotherapy by history:  No  Recommended Plan for Multiple Antipsychotic Therapies: NA  Discharge Instructions    Discharge instructions    Complete by:  As directed    Please continue to take medications as directed. If your symptoms return, worsen, or persist please call your 911, report to local ER, or contact crisis hotline. Please do not drink alcohol or use any illegal substances while taking prescription medications. Please keep your schedule appointments as directed.   Discharge patient    Complete by:  As directed    Discharge disposition:  01-Home or Self Care   Discharge patient date:  12/21/2016     Allergies  as of 12/21/2016   No Known Allergies     Medication List    TAKE these medications     Indication  DESCOVY 200-25 MG tablet Generic drug:  emtricitabine-tenofovir AF TAKE 1 TABLET BY MOUTH DAILY  Indication:  HIV Disease   DULoxetine 60 MG capsule Commonly known as:  CYMBALTA Take 1 capsule (60 mg total) by mouth daily. Please take 60 mg daily. What changed:  how much to take  additional instructions  Indication:  Major Depressive Disorder   hydrochlorothiazide 25 MG tablet Commonly known as:  HYDRODIURIL Take 1 tablet (25 mg total) by mouth daily.  Indication:  High Blood Pressure Disorder   naproxen 375 MG tablet Commonly known as:  NAPROSYN Take 375 mg by mouth 2 (two) times daily as needed for mild pain.  Indication:  Pain   Pitavastatin Calcium 4 MG Tabs Take 1 tablet by mouth daily. This may be placebo and is study provided. Do not dispense.  Indication:  research   sildenafil 100 MG tablet Commonly known as:  VIAGRA Take 1 tablet (100 mg total) by mouth daily as needed for erectile dysfunction. Come through patient assistance   Indication:  Erectile Dysfunction   tamsulosin 0.4 MG Caps capsule Commonly known as:  FLOMAX Take 1 capsule (0.4 mg total) by mouth 2 (two) times daily.  Indication:  Benign Enlargement of Prostate   TIVICAY 50 MG tablet Generic drug:  dolutegravir TAKE 1 TABLET BY MOUTH DAILY  Indication:  HIV Disease      Follow-up Information    [facility]. Go in 1 day(s).   Specialty:  Behavioral Health Why:  Pt has an appoinment with his counselor @12 :30pm  on the above mentioned date. Contact information: [address] [place] [address] [phone]           Follow-up recommendations:  Activity:  Increase activity as tolerated. Diet:  Regular house diet Tests:  Routine lab work as recommend by outpatient provider.  Other:  Take medications daily.    Signed: Truman Hayward, FNP 12/21/2016, 9:35 AM

## 2016-12-21 NOTE — Progress Notes (Signed)
[facility] OBSERVATION UNIT:  Family/Significant Other Suicide Prevention Education  Suicide Prevention Education:  Patient Refusal for Family/Significant Other Suicide Prevention Education: The patient Arthur [name] has refused to provide written consent for family/significant other to be provided Family/Significant Other Suicide Prevention Education during admission and/or prior to discharge.  Physician notified.  Camelia EngKaren H Gamal Todisco 12/21/2016, 8:55 AM   Camelia EngKaren H Auna Mikkelsen, RN 12/21/16  8:55 AM

## 2016-12-21 NOTE — Progress Notes (Signed)
D:  Patient awake and alert and oriented x 4; he is pleasant and cooperative;  he denies suicidal and homicidal ideation and AVH; no self-injurious behaviors noted or reported. A:  Medications given as scheduled;  Emotional support provided; encouraged him to seek assistance with needs/concerns. R:  Safety maintained on unit.

## 2016-12-21 NOTE — BH Specialist Note (Signed)
I met with Arthur [name] today for the first time and he shared his history of trauma and his struggle with alcohol abuse. He said he just got out of detox, going there as a means to getting into SA treatment with [facility]. We discussed EMDR as a treatment modality for his trauma. Plan to meet again in one week. Curley Spice, LCSW

## 2016-12-21 NOTE — H&P (Signed)
[facility] OBS UNIT H&P   Patient Identification: Arthur [name] MRN:  [id number] Principal Diagnosis: Alcohol-induced mood disorder [facility]) Diagnosis:   Patient Active Problem List   Diagnosis Date Noted  . Alcohol-induced mood disorder ([place]) [F10.94] 12/20/2016    Priority: High  . TIA (transient ischemic attack) [G45.9] 11/17/2016  . HTN (hypertension) [I10] 11/17/2016  . 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 Alcoholism ([place]) [F10.20] 06/20/2016  . Recurrent major depression-severe ([place]) [F33.2] 06/20/2016  . Hypoglycemia [E16.2] 04/05/2016  . Difficulty voiding [R39.198] 12/15/2015  . Diarrhea [R19.7] 11/25/2015  . Flank pain [R10.9] 11/25/2015  . Dysuria [R30.0] 11/25/2015  . Lower back pain [M54.5] 11/25/2015  . Loss of weight [R63.4] 11/17/2015  . Fever, unspecified [R50.9] 11/17/2015  . Cough [R05] 11/17/2015  . History of alcohol abuse [Z87.898] 08/31/2015  . 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 Clinical depression [F32.9] 11/27/2014  . HIV disease ([place]) [B20] 11/12/2014  . Bipolar 1 disorder ([place]) [F31.9] 05/15/2012  . 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 Elevated fasting blood sugar [R73.01] 05/15/2012  . H/O surgical procedure [Z98.890] 05/15/2012  . Lung mass [R91.8] 05/15/2012  . Bipolar I disorder ([place]) [F31.9] 05/15/2012  . History of surgical procedure [Z98.890] 05/15/2012    Total Time spent with patient: 30 minutes  Subjective:   Arthur [name] is a 61 y.o. male patient admitted with reports of alcohol abuse and depression. Pt had passive suicidal thoughts without plan and is improving today somewhat. He has been here since early this morning. Today, early, staff helped him set up referrals with [facility]. Pt will continue on CIWA for stabilization and will discharge tomorrow morning.   HPI:  I have reviewed and concur with HPI elements below, modified as follows:  "Arthur [name] is an 61 y.o. male, Caucasian, single who  presents to [facility] ED per ED report: hx of alcoholism and depression,who presents to the Emergency Department complaingg of sudden onset, persistent nausea and requesting alcohol detox x today. Per triage note, pt has consumed about 15 alcoholic beverages today. Pt states he has has this issue for about a year now. He also reports he has been feeling depressed, suicidal ideations and states he does not care if he lives. Pt states he has enough medication to be able to kill himself. No alleviating factors noted. Pt denies any other associated symptoms and modifying factors at this time. Patient states that primary concern is of depression and increased ETOH. Patient states that sponsor brought him to the ED. Patient states that he does have SI thoughts and plan was to o.d. On HIV medications with other passive plans to drink self into intoxication of lethal levels. Patient upon assessment states that he is able to contract for safety and stated when asked about med compliance has been taking HIV meds as prescribed and been compliant. Patient states he does at this  point have SI and plan related to [facility]. With alcohol intoxication. Patient states he does have support channels but in [place] area prefers not have to leave that area. Patient states he does live alone, has A.A. Sponsor, some support from other areas including [facility] [[facility]]. Patient states he has had decrease in sleep with off and on sleep with at most x 4 hours sleep per night. Patient states since HIV status has started drinking more heavily. Patient acknowledges current SI, plan per pt. Currently to drink to dangerous levels [prior to assessment was to o.d. On  HIV medications]. Patient at present states contracts for safety. Patient denies HI and AVH. Patient acknowledges S.A. Hx with last consumption today with x 5 beers and x 10 mixed drinks and is drinking daily various amounts. Patient denies hx. Of inpatient  psych care. Patient denies hx. Of outpatient psych care but states he does have x 1 person he trusts at [place] whom he would like to start seeing for therapy.  Pt arrived at OBS without incident. Seen today on 12/20/2016 as above. He reports that he is suicidal but no longer has a plan.   Past Psychiatric History: ETOH  Risk to Self: Is patient at risk for suicide?: Yes Risk to Others:   Prior Inpatient Therapy:   Prior Outpatient Therapy:    Past Medical History:  Past Medical History:  Diagnosis Date  . Alcoholism ([place]) 06/20/2016  . Bipolar disorder ([place])   . Cough 11/17/2015  . Depression   . Diarrhea 11/25/2015  . Difficulty voiding 12/15/2015  . Dysuria 11/25/2015  . Fever, unspecified 11/17/2015  . Flank pain 11/25/2015  . History of blood transfusion 12-12-1998  . HIV infection ([place])   . HTN (hypertension) 11/17/2016  . 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 Late syphilis   . Lower back pain 11/25/2015  . Recurrent major depression-severe ([place]) 06/20/2016  . TIA (transient ischemic attack) 11/17/2016    Past Surgical History:  Procedure Laterality Date  . broken right leg Right 2000  . FRACTURE SURGERY    . GASTRIC BYPASS  2012   [facility]   Family History:  Family History  Problem Relation Age of Onset  . Heart disease Mother   . Stroke Mother   . Heart disease Father    Family Psychiatric  History: denies Social History:  History  Alcohol Use  . 50.4 oz/week  . 48 Cans of beer per week    Comment: quit 01/15/15     History  Drug Use No    Social History   Social History  . Marital status: Single    Spouse name: N/A  . Number of children: N/A  . Years of education: N/A   Social History Main Topics  . Smoking status: Never Smoker  . Smokeless tobacco: Never Used  . Alcohol use 50.4 oz/week    84 Cans of beer per week     Comment: quit 01/15/15  . Drug use: No  . Sexual activity: Not  Currently    Partners: Male   Other Topics Concern  . None   Social History Narrative  . None   Additional Social History:    Allergies:  No Known Allergies  Labs:  Results for orders placed or performed during the hospital encounter of 12/19/16 (from the past 48 hour(s))  Comprehensive metabolic panel     Status: Abnormal   Collection Time: 12/19/16  5:42 PM  Result Value Ref Range   Sodium 135 135 - 145 mmol/L   Potassium 3.9 3.5 - 5.1 mmol/L   Chloride 101 101 - 111 mmol/L   CO2 20 (L) 22 - 32 mmol/L   Glucose, Bld 90 65 - 99 mg/dL   BUN 11 6 - 20 mg/dL   Creatinine, Ser 1.01 0.61 - 1.24 mg/dL   Calcium 9.0 8.9 - 10.3 mg/dL   Total Protein 6.5 6.5 - 8.1 g/dL   Albumin 4.1 3.5 - 5.0 g/dL   AST 32 15 - 41 U/L   ALT 20  17 - 63 U/L   Alkaline Phosphatase 57 38 - 126 U/L   Total Bilirubin 0.8 0.3 - 1.2 mg/dL   GFR calc non Af Amer >60 >60 mL/min   GFR calc Af Amer >60 >60 mL/min    Comment: (NOTE) The eGFR has been calculated using the CKD EPI equation. This calculation has not been validated in all clinical situations. eGFR's persistently <60 mL/min signify possible Chronic Kidney Disease.    Anion gap 14 5 - 15  cbc     Status: Abnormal   Collection Time: 12/19/16  5:42 PM  Result Value Ref Range   WBC 8.8 4.0 - 10.5 K/uL   RBC 3.86 (L) 4.22 - 5.81 MIL/uL   Hemoglobin 12.3 (L) 13.0 - 17.0 g/dL   HCT 35.8 (L) 39.0 - 52.0 %   MCV 92.7 78.0 - 100.0 fL   MCH 31.9 26.0 - 34.0 pg   MCHC 34.4 30.0 - 36.0 g/dL   RDW 13.3 11.5 - 15.5 %   Platelets 188 150 - 400 K/uL  Ethanol     Status: Abnormal   Collection Time: 12/19/16  6:03 PM  Result Value Ref Range   Alcohol, Ethyl (B) 143 (H) <5 mg/dL    Comment:        LOWEST DETECTABLE LIMIT FOR SERUM ALCOHOL IS 5 mg/dL FOR MEDICAL PURPOSES ONLY   Rapid urine drug screen (hospital performed)     Status: None   Collection Time: 12/19/16  6:08 PM  Result Value Ref Range   Opiates NONE DETECTED NONE DETECTED   Cocaine  NONE DETECTED NONE DETECTED   Benzodiazepines NONE DETECTED NONE DETECTED   Amphetamines NONE DETECTED NONE DETECTED   Tetrahydrocannabinol NONE DETECTED NONE DETECTED   Barbiturates NONE DETECTED NONE DETECTED    Comment:        DRUG SCREEN FOR MEDICAL PURPOSES ONLY.  IF CONFIRMATION IS NEEDED FOR ANY PURPOSE, NOTIFY LAB WITHIN 5 DAYS.        LOWEST DETECTABLE LIMITS FOR URINE DRUG SCREEN Drug Class       Cutoff (ng/mL) Amphetamine      1000 Barbiturate      200 Benzodiazepine   001 Tricyclics       749 Opiates          300 Cocaine          300 THC              50   Urinalysis, Routine w reflex microscopic     Status: Abnormal   Collection Time: 12/19/16  6:08 PM  Result Value Ref Range   Color, Urine STRAW (A) YELLOW   APPearance CLEAR CLEAR   Specific Gravity, Urine 1.005 1.005 - 1.030   pH 5.0 5.0 - 8.0   Glucose, UA NEGATIVE NEGATIVE mg/dL   Hgb urine dipstick NEGATIVE NEGATIVE   Bilirubin Urine NEGATIVE NEGATIVE   Ketones, ur NEGATIVE NEGATIVE mg/dL   Protein, ur NEGATIVE NEGATIVE mg/dL   Nitrite NEGATIVE NEGATIVE   Leukocytes, UA NEGATIVE NEGATIVE    Current Facility-Administered Medications  Medication Dose Route Frequency Provider Last Rate Last Dose  . acetaminophen (TYLENOL) tablet 650 mg  650 mg Oral Q6H PRN Laverle Hobby, PA-C      . alum & mag hydroxide-simeth (MAALOX/MYLANTA) 200-200-20 MG/5ML suspension 30 mL  30 mL Oral Q4H PRN Laverle Hobby, PA-C      . hydrOXYzine (ATARAX/VISTARIL) tablet 25 mg  25 mg Oral Q6H PRN Laverle Hobby, PA-C  25 mg at 12/20/16 0507  . loperamide (IMODIUM) capsule 2-4 mg  2-4 mg Oral PRN Spencer E Simon, PA-C      . LORazepam (ATIVAN) tablet 1 mg  1 mg Oral Q6H PRN Spencer E Simon, PA-C      . LORazepam (ATIVAN) tablet 1 mg  1 mg Oral QID Spencer E Simon, PA-C   1 mg at 12/20/16 0900   Followed by  . [START ON 12/21/2016] LORazepam (ATIVAN) tablet 1 mg  1 mg Oral TID Spencer E Simon, PA-C       Followed by  . [START ON  12/22/2016] LORazepam (ATIVAN) tablet 1 mg  1 mg Oral BID Spencer E Simon, PA-C       Followed by  . [START ON 12/23/2016] LORazepam (ATIVAN) tablet 1 mg  1 mg Oral Daily Spencer E Simon, PA-C      . magnesium hydroxide (MILK OF MAGNESIA) suspension 30 mL  30 mL Oral Daily PRN Spencer E Simon, PA-C      . multivitamin with minerals tablet 1 tablet  1 tablet Oral Daily Spencer E Simon, PA-C   1 tablet at 12/20/16 0900  . ondansetron (ZOFRAN-ODT) disintegrating tablet 4 mg  4 mg Oral Q6H PRN Spencer E Simon, PA-C      . [START ON 12/21/2016] thiamine (VITAMIN B-1) tablet 100 mg  100 mg Oral Daily Spencer E Simon, PA-C      . traZODone (DESYREL) tablet 50 mg  50 mg Oral QHS,MR X 1 Spencer E Simon, PA-C        Musculoskeletal: Strength & Muscle Tone: within normal limits Gait & Station: normal Patient leans: N/A  Psychiatric Specialty Exam: Physical Exam  Review of Systems  Psychiatric/Behavioral: Positive for depression, substance abuse and suicidal ideas. The patient is nervous/anxious and has insomnia.   All other systems reviewed and are negative.   Blood pressure (!) 96/57, pulse 79, temperature 98.9 F (37.2 C), temperature source Oral, resp. rate 18, height 5' 9" (1.753 m), weight 83.9 kg (185 lb), SpO2 99 %.Body mass index is 27.32 kg/m.  General Appearance: Casual and Fairly Groomed  Eye Contact:  Good  Speech:  Clear and Coherent and Normal Rate  Volume:  Normal  Mood:  Anxious and Depressed  Affect:  Appropriate, Congruent and Depressed  Thought Process:  Coherent, Goal Directed, Linear and Descriptions of Associations: Intact  Orientation:  Full (Time, Place, and Person)  Thought Content:  Symptoms, worries, concerns  Suicidal Thoughts:  Yes.  without intent/plan  Homicidal Thoughts:  No  Memory:  Immediate;   Fair Recent;   Fair Remote;   Fair  Judgement:  Fair  Insight:  Fair  Psychomotor Activity:  Normal  Concentration:  Concentration: Fair and Attention Span: Fair   Recall:  Fair  Fund of Knowledge:  Fair  Language:  Fair  Akathisia:  No  Handed:    AIMS (if indicated):     Assets:  Communication Skills Desire for Improvement Resilience Social Support  ADL's:  Intact  Cognition:  WNL  Sleep:      Treatment Plan Summary: Alcohol-induced mood disorder (HCC) with MDD, recurrent, severe, without psychotic features, managed as below:  Medications:   Ativan/CIWA Flomax 0.4mg po bid BPH Trazodone 50mg po qhs prn insomnia HIV meds (continued by pharmacy) -Vistaril 25mg po q6h prn anxiety   Disposition: [facility] OBS UNIT OVERNIGHT  Jeananne Bedwell C, FNP 12/20/2016 9:47 AM       

## 2016-12-21 NOTE — Progress Notes (Signed)
Written/verbal discharge instructions, AVS and follow-up appointments given to patient with verbalization of understanding;  Patient denies suicidal and homicidal ideation. Suicide Prevention information/materials given to patient with patient verbalizing understanding  All patient belongings including medications returned to patient at time of discharge (see belongings sheet) Discharged home in stable home with Sponsor providing transportation.

## 2016-12-28 ENCOUNTER — Ambulatory Visit: Payer: Self-pay

## 2017-01-31 ENCOUNTER — Encounter (INDEPENDENT_AMBULATORY_CARE_PROVIDER_SITE_OTHER): Payer: No Typology Code available for payment source | Admitting: *Deleted

## 2017-01-31 VITALS — BP 136/96 | HR 85 | Temp 98.2°F | Wt 186.8 lb

## 2017-01-31 DIAGNOSIS — Z006 Encounter for examination for normal comparison and control in clinical research program: Secondary | ICD-10-CM

## 2017-01-31 MED FILL — TAMSULOSIN HCL 0.4 MG CAP: 0.4 | 30 days supply | Qty: 60 | Fill #0

## 2017-01-31 NOTE — Progress Notes (Signed)
[name] was here for his month 20 visit for Reprieve, A Randomized Trial to Prevent Vascular Events in HIV (study drug is Pitavastatin 4mg  or placebo).  He says he has been doing fine. Depression is better but still sleeps a lot, has more energy and getting out more. He has noticed some tingling in his toes lately. Continues to have trouble with his voiding and has noticed that when he takes viagra it is easier to void. He will come back in June for the next Reprieve visit and [facility].

## 2017-02-01 ENCOUNTER — Telehealth: Payer: Self-pay | Admitting: *Deleted

## 2017-02-01 NOTE — Telephone Encounter (Signed)
Patient left message in triage [facility] 100 mg.  Will forward to LaneErin. Andree CossHowell, Michelle M, RN

## 2017-02-09 NOTE — Telephone Encounter (Signed)
Placed refill today. Left patient a voicemail to return call. If patient calls back just wanted to inform him in the future we can only fill this every 90 days instead of 60. Per Tammy, new policy restricts viagra to 10 pills per month.

## 2017-02-14 ENCOUNTER — Ambulatory Visit: Payer: No Typology Code available for payment source

## 2017-02-14 DIAGNOSIS — F4323 Adjustment disorder with mixed anxiety and depressed mood: Secondary | ICD-10-CM

## 2017-02-14 DIAGNOSIS — F1010 Alcohol abuse, uncomplicated: Secondary | ICD-10-CM

## 2017-02-14 NOTE — BH Specialist Note (Signed)
Arthur [name] was pleasant today reporting he has been attending an AA group that he likes and it is very helpful. He also talked about a book he is reading by theologian Jules Husbandsichard Rohr that he finds very helpful. I praised him and said I had heard good things about Rohr. Plan to meet again in one week. Franne FortsKenny Shuntavia Yerby, LCSW

## 2017-02-21 ENCOUNTER — Ambulatory Visit: Payer: Self-pay

## 2017-02-23 ENCOUNTER — Ambulatory Visit: Payer: No Typology Code available for payment source

## 2017-02-23 DIAGNOSIS — F330 Major depressive disorder, recurrent, mild: Secondary | ICD-10-CM

## 2017-02-23 NOTE — BH Specialist Note (Signed)
[name] talked about how he is enjoying attending several AA groups around town. In addition, he is going to a support group at [employer]. He also talked about a men's religious retreat he is going on this weekend that he is very excited about. I praised him for all the work he is doing. Plan to meet again in 1-2 weeks. Franne FortsKenny Lodema Parma, LCSW

## 2017-03-07 MED FILL — TAMSULOSIN HCL 0.4 MG CAP: 0.4 | 30 days supply | Qty: 60 | Fill #1

## 2017-04-11 ENCOUNTER — Other Ambulatory Visit: Payer: Self-pay

## 2017-04-11 MED ORDER — EMTRICITABINE-TENOFOVIR AF 200-25 MG PO TABS: 1.0000 | ORAL_TABLET | Freq: Every day | ORAL | 2 refills | Status: DC

## 2017-04-11 MED ORDER — DOLUTEGRAVIR SODIUM 50 MG PO TABS: 50.0000 mg | ORAL_TABLET | Freq: Every day | ORAL | 2 refills | Status: DC

## 2017-04-11 NOTE — Telephone Encounter (Signed)
Arthur [name] requested medication refills for [name]. Tivicay and Descovy .

## 2017-04-12 MED FILL — TAMSULOSIN HCL 0.4 MG CAP: 0.4 | 30 days supply | Qty: 60 | Fill #2

## 2017-04-13 ENCOUNTER — Telehealth: Payer: Self-pay | Admitting: Infectious Disease

## 2017-04-13 NOTE — Telephone Encounter (Signed)
Returned patient's call from Wednesday 04/12/17, patient was requesting a refill on his viagra. Per clinic policy patient is not due for a refill until 05/12/17, the last refill was placed on 02/09/17. Will await patient's call back to let him know.

## 2017-04-23 IMAGING — MR MR HEAD W/O CM
9 of 11 series · 33 of 48 positions shown · non-contrast
Comparison: None.

CLINICAL DATA: Right facial droop

EXAM:
MRI HEAD WITHOUT CONTRAST
MRA HEAD WITHOUT CONTRAST
TECHNIQUE: Multiplanar, multiecho pulse sequences of the brain and surrounding
structures were obtained without intravenous contrast. Angiographic
images of the head were obtained using MRA technique without
contrast.

[Series 3: T1 · sagittal · 5.0mm · 0.47mm/px · 1 of 25 slices shown]
[im 1/25]
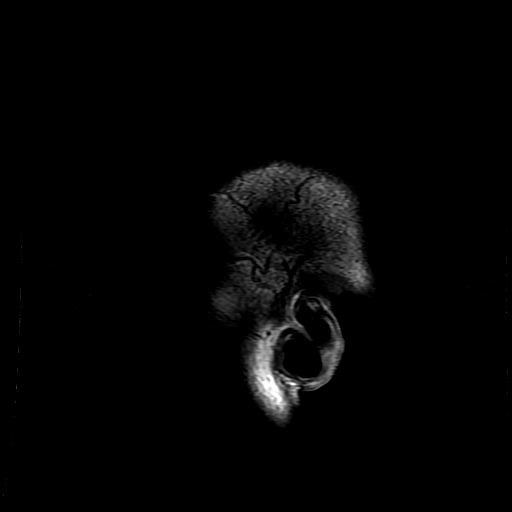

[Series 4: DWI · axial · 3.0mm · 1.09mm/px · z∈[-81,+59]mm · 8 of 96 slices shown (1 of 4)]
[im 1/96]
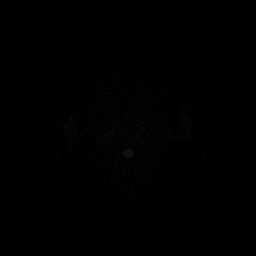
[im 14/96]
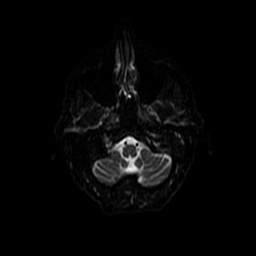
[im 28/96]
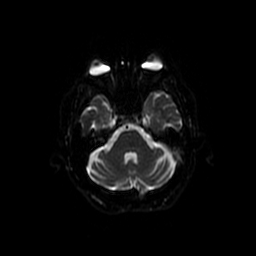
[im 41/96]
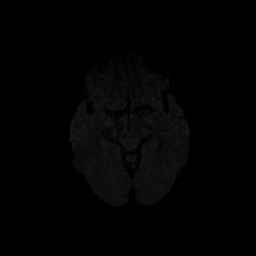
[im 55/96]
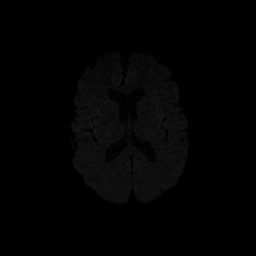
[im 68/96]
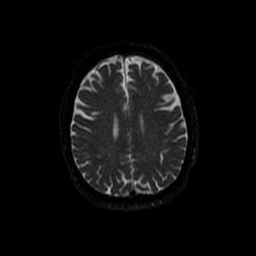
[im 82/96]
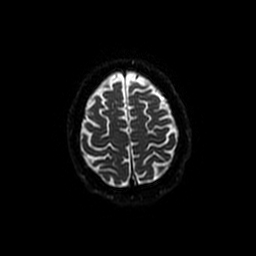
[im 96/96]
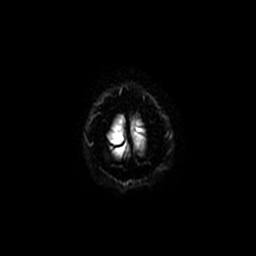

[Series 5: (id) mt fs · axial · 1.4mm · 0.43mm/px · z∈[-109,-45]mm · 6 of 136 slices shown]
[im 1/136]
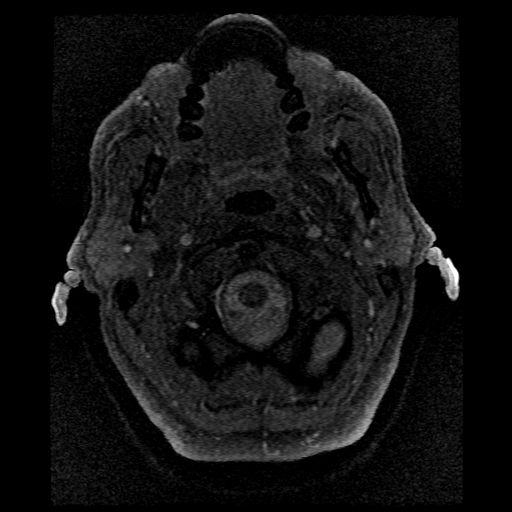
[im 28/136]
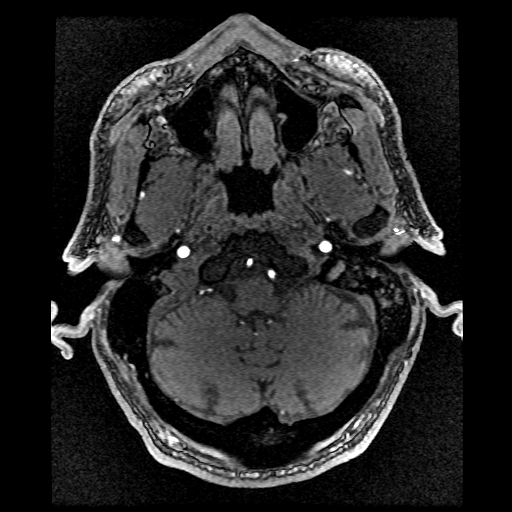
[im 41/136]
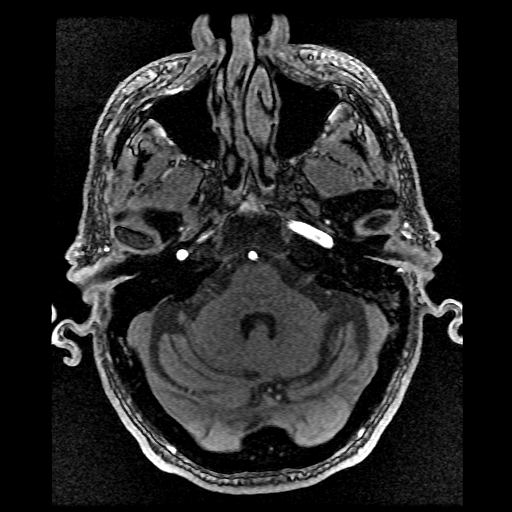
[im 55/136]
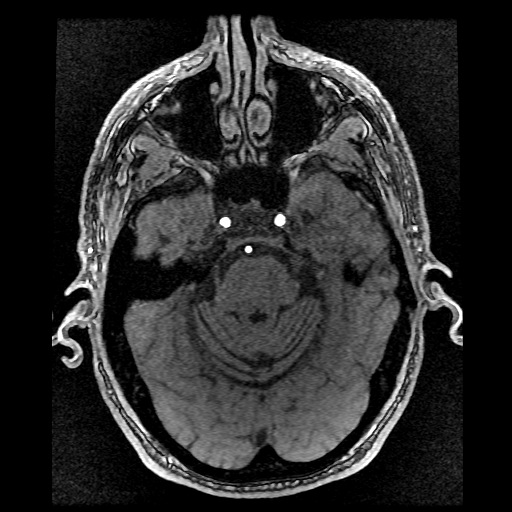
[im 82/136]
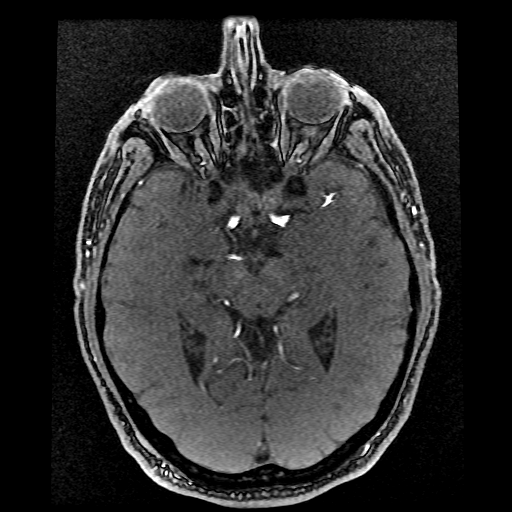
[im 95/136]
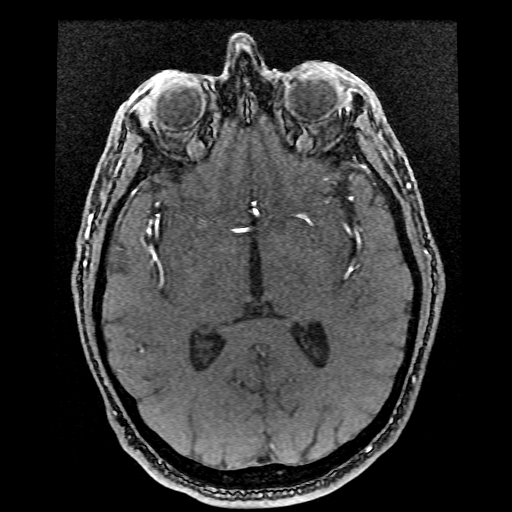

[Series 6: T2 · axial · 5.0mm · 0.47mm/px · z∈[-79,+58]mm · 2 of 24 slices shown (1 of 2)]
[im 1/24]
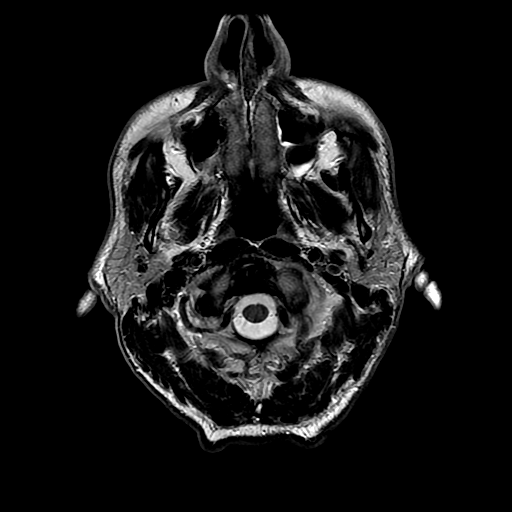
[im 24/24]
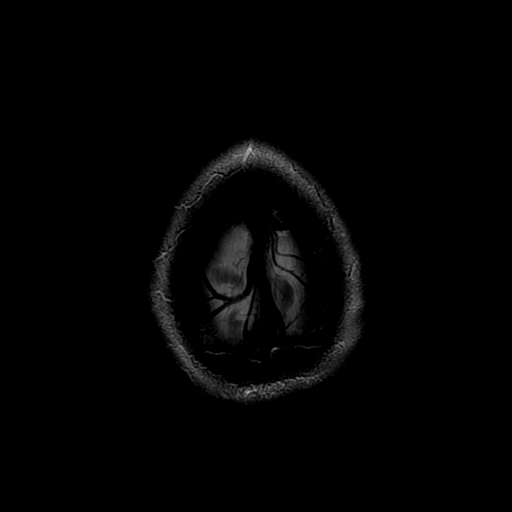

[Series 7: DWI · coronal · 5.0mm · 1.09mm/px · 5 of 68 slices shown (2 of 4)]
[im 1/68]
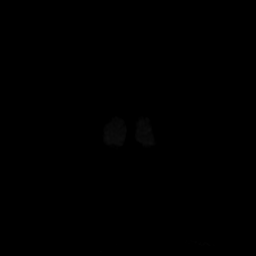
[im 17/68]
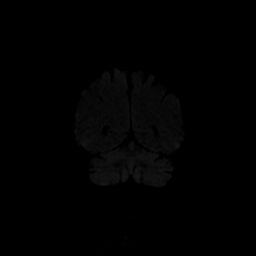
[im 34/68]
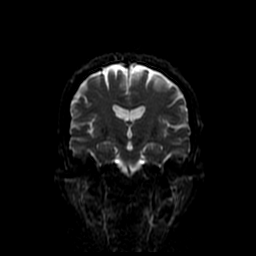
[im 51/68]
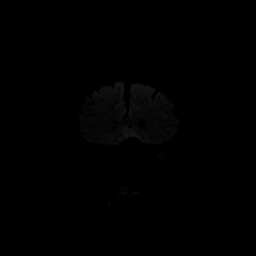
[im 68/68]
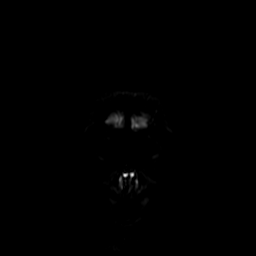

[Series 8: FLAIR · axial · 5.0mm · 0.47mm/px · z∈[-79,+58]mm · 2 of 24 slices shown]
[im 1/24]
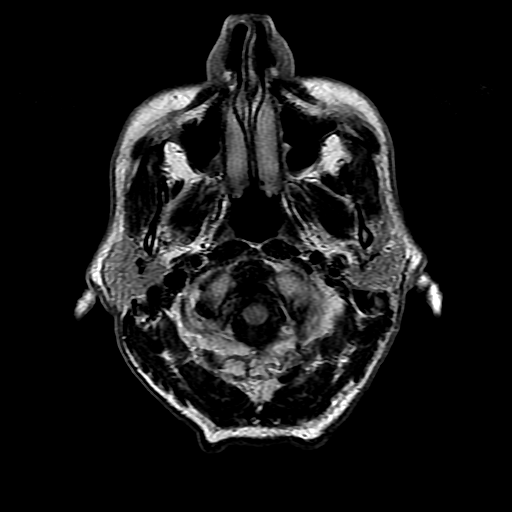
[im 24/24]
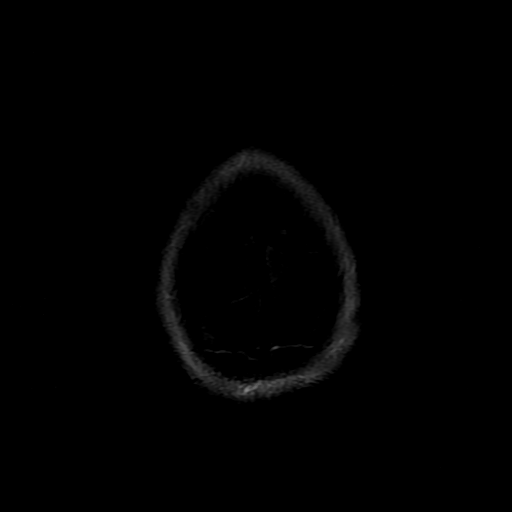

[Series 11: T2 · coronal · 5.0mm · 0.39mm/px · 2 of 26 slices shown (2 of 2)]
[im 1/26]
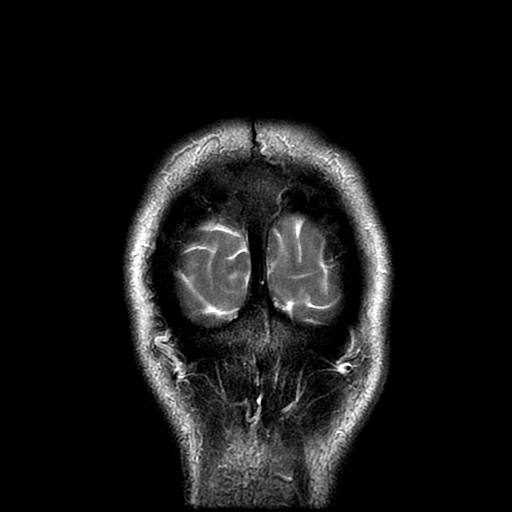
[im 26/26]
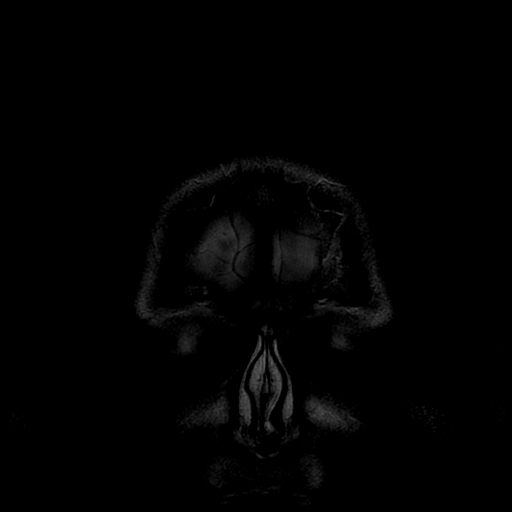

[Series 400: DWI · axial · 3.0mm · 1.09mm/px · z∈[-81,+59]mm · 4 of 48 slices shown (3 of 4)]
[im 1/48]
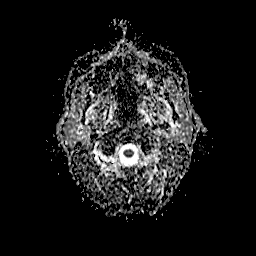
[im 16/48]
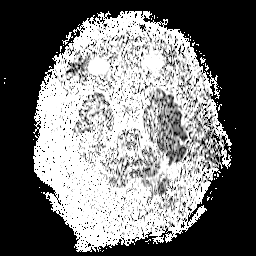
[im 32/48]
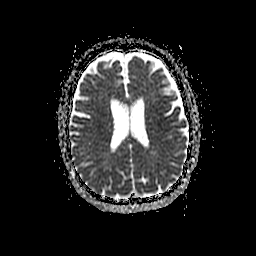
[im 48/48]
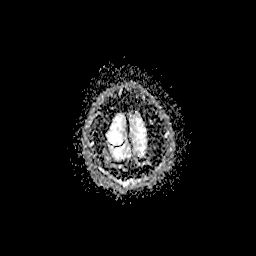

[Series 700: DWI · coronal · 5.0mm · 1.09mm/px · 3 of 34 slices shown (4 of 4)]
[im 1/34]
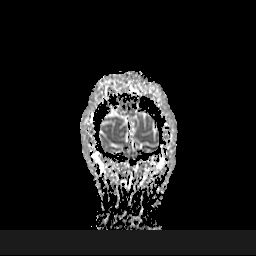
[im 17/34]
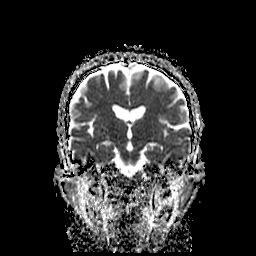
[im 34/34]
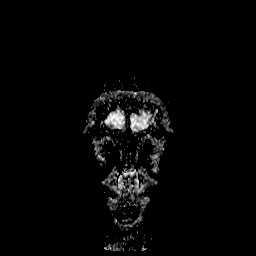

[33 of 48 positions shown; findings below may reference images not displayed]

FINDINGS: MRI HEAD FINDINGS

Ventricle size normal.  Mild frontal atrophy.

Negative for acute or chronic infarction.

Negative for demyelinating disease. Cerebral white matter normal.
Basal ganglia and brainstem normal.

Negative for hemorrhage or fluid collection.

Negative for mass or edema.

Mild mucosal edema paranasal sinuses.  Left mastoid effusion.

Pituitary normal in size.  Normal orbit.

MRA HEAD FINDINGS

Both vertebral arteries patent to the basilar. PICA patent
bilaterally. Superior cerebellar and posterior cerebral arteries are
normal

Cavernous carotid is normal bilaterally. Anterior and middle
cerebral arteries are normal bilaterally.

Negative for cerebral aneurysm.
IMPRESSION: No acute intracranial abnormality.  Normal for age MRI of the brain

Negative MRA head

Mild mucosal edema in the paranasal sinuses. Left mastoid sinus
effusion.

## 2017-04-26 ENCOUNTER — Encounter: Payer: Self-pay | Admitting: Infectious Disease

## 2017-05-11 ENCOUNTER — Other Ambulatory Visit: Payer: Self-pay | Admitting: Infectious Disease

## 2017-05-12 MED FILL — TAMSULOSIN HCL 0.4 MG CAP: 0.4 | 30 days supply | Qty: 60 | Fill #3

## 2017-05-23 ENCOUNTER — Telehealth: Payer: Self-pay | Admitting: *Deleted

## 2017-05-23 ENCOUNTER — Encounter: Payer: Self-pay | Admitting: *Deleted

## 2017-05-23 MED FILL — TAMSULOSIN HCL 0.4 MG CAP: 0.4 | 30 days supply | Qty: 60 | Fill #4

## 2017-05-23 NOTE — Telephone Encounter (Signed)
Patient called regarding a prior authorization for viagra. He thought that Denny PeonErin, medication assistant had sent in the PA. I looked through his folder and it looks like it was last sent 02/2017. Advised patient to call Con MemosMichelle Evans from [facility] to see if she can assist him. Number provided [phone]. He agreed to this.

## 2017-06-04 ENCOUNTER — Other Ambulatory Visit: Payer: Self-pay | Admitting: Infectious Disease

## 2017-06-04 DIAGNOSIS — B20 Human immunodeficiency virus [HIV] disease: Secondary | ICD-10-CM

## 2017-06-04 DIAGNOSIS — F329 Major depressive disorder, single episode, unspecified: Secondary | ICD-10-CM

## 2017-06-05 MED ORDER — DOLUTEGRAVIR SODIUM 50 MG PO TABS: 50.0000 mg | ORAL_TABLET | Freq: Every day | ORAL | 5 refills | Status: DC

## 2017-06-08 ENCOUNTER — Encounter (INDEPENDENT_AMBULATORY_CARE_PROVIDER_SITE_OTHER): Payer: No Typology Code available for payment source | Admitting: *Deleted

## 2017-06-08 VITALS — BP 115/76 | HR 64 | Temp 98.5°F | Wt 186.0 lb

## 2017-06-08 DIAGNOSIS — Z006 Encounter for examination for normal comparison and control in clinical research program: Secondary | ICD-10-CM

## 2017-06-08 LAB — COMPREHENSIVE METABOLIC PANEL
ALT: 12 U/L (ref 9–46)
AST: 16 U/L (ref 10–35)
Albumin: 4.3 g/dL (ref 3.6–5.1)
Alkaline Phosphatase: 67 U/L (ref 40–115)
BUN: 8 mg/dL (ref 7–25)
CHLORIDE: 103 mmol/L (ref 98–110)
CO2: 26 mmol/L (ref 20–31)
CREATININE: 0.93 mg/dL (ref 0.70–1.25)
Calcium: 8.9 mg/dL (ref 8.6–10.3)
GLUCOSE: 91 mg/dL (ref 65–99)
Potassium: 4.1 mmol/L (ref 3.5–5.3)
SODIUM: 137 mmol/L (ref 135–146)
TOTAL PROTEIN: 6.6 g/dL (ref 6.1–8.1)
Total Bilirubin: 0.7 mg/dL (ref 0.2–1.2)

## 2017-06-08 NOTE — Progress Notes (Signed)
Arthur [name] is here for his 24 month visit for [phone] - Randomized Trial to prevent Vascular Events in HIV (REPRIEVE study).  Med: Pitavastatin/Placebo 4mg  PO Daily Duration: 3.5 - 6 years.  He says he is doing very well and has been sober for 125 days. He says his depression is better but wonders if he can boost his duloxetine up another 30 mg to see if it will help. I told him I will check with dr. Daiva EvesVan Dam about it. He was even talking about trying to work some. The CT for the study is planned for 7/2 at 8am and he was directed on fasting for 4 hours prior to the scan, no caffeine for 12 hours and no viagra for the next several days until after the procedure. He will return on October 23rd for the next study visit.

## 2017-06-09 ENCOUNTER — Encounter: Payer: Self-pay | Admitting: *Deleted

## 2017-06-09 ENCOUNTER — Telehealth: Payer: Self-pay | Admitting: *Deleted

## 2017-06-09 ENCOUNTER — Other Ambulatory Visit: Payer: Self-pay | Admitting: *Deleted

## 2017-06-09 DIAGNOSIS — F330 Major depressive disorder, recurrent, mild: Secondary | ICD-10-CM

## 2017-06-09 MED ORDER — DULOXETINE HCL 60 MG PO CPEP: 120.0000 mg | ORAL_CAPSULE | Freq: Every day | ORAL | 3 refills | Status: DC

## 2017-06-09 NOTE — Telephone Encounter (Signed)
Discussed Arthur [name]'s request to increase dose of duloxetine to 120 mg daily. Arthur [name] said his depression was much better but he thought he still needed a little bit more boost on the duloxetine. He has been taking 90 mg daily since January. Dr. Daiva EvesVan Dam said it was okay to change the medication to 120mg  daily.  I let Arthur [name] know about the change on the medication.

## 2017-06-12 LAB — HIV-1 RNA QUANT-NO REFLEX-BLD
HIV 1 RNA Quant: <20 copies/mL
HIV-1 RNA Quant, Log: <1.3 Log copies/mL

## 2017-06-15 ENCOUNTER — Telehealth: Payer: Self-pay | Admitting: Licensed Clinical Social Worker

## 2017-06-15 NOTE — Telephone Encounter (Signed)
Spoke with patient and asked to call Margaree MackintoshGiovani Burton at [facility] at [phone].  Vergia AlbertsSherry Ember Gottwald, [facility]

## 2017-06-16 ENCOUNTER — Telehealth: Payer: Self-pay | Admitting: *Deleted

## 2017-06-16 DIAGNOSIS — I2510 Atherosclerotic heart disease of native coronary artery without angina pectoris: Secondary | ICD-10-CM

## 2017-06-16 MED ORDER — ASPIRIN 81 MG PO TABS: 81.0000 mg | ORAL_TABLET | Freq: Every day | ORAL | 11 refills | Status: DC

## 2017-06-16 MED ORDER — ROSUVASTATIN CALCIUM 10 MG PO TABS: 10.0000 mg | ORAL_TABLET | Freq: Every day | ORAL | 11 refills | Status: DC

## 2017-06-16 NOTE — Telephone Encounter (Signed)
Perfect

## 2017-06-16 NOTE — Telephone Encounter (Signed)
Dr. Daiva EvesVan Dam wants Arthur [name] to stop taking the pitavastatin/placebo and start crestor and small dose apirin due to the [facility], which showed a calcium score of 314 and some mild/moderate luminal narrowing in the origin of the LAD. (40-50%). After discussion with pharmacy, it was decided to dose crestor at 10mg  daily. Arthur [name] was notified of the changes and will stop study drug and start crestor [facility]

## 2017-06-19 ENCOUNTER — Encounter: Payer: Self-pay | Admitting: *Deleted

## 2017-06-19 LAB — CD4/CD8 (T-HELPER/T-SUPPRESSOR CELL)
CD4 % Helper T Cell: 40.3
CD4 Count: 1370
CD8 T CELL ABS: 1268
CD8 T CELL SUPPRESSOR: 37.3

## 2017-07-04 MED FILL — TAMSULOSIN HCL 0.4 MG CAP: 0.4 | 30 days supply | Qty: 60 | Fill #5

## 2017-08-02 ENCOUNTER — Emergency Department (HOSPITAL_COMMUNITY)
Admission: EM | Admit: 2017-08-02 | Discharge: 2017-08-03 | Disposition: A | Discharge status: Other Acute Inpatient Hospital | Payer: Self-pay | Attending: Emergency Medicine | Admitting: Emergency Medicine

## 2017-08-02 DIAGNOSIS — F1020 Alcohol dependence, uncomplicated: Secondary | ICD-10-CM | POA: Diagnosis present

## 2017-08-02 DIAGNOSIS — F1010 Alcohol abuse, uncomplicated: Secondary | ICD-10-CM

## 2017-08-02 DIAGNOSIS — Z7982 Long term (current) use of aspirin: Secondary | ICD-10-CM | POA: Insufficient documentation

## 2017-08-02 DIAGNOSIS — F333 Major depressive disorder, recurrent, severe with psychotic symptoms: Secondary | ICD-10-CM | POA: Diagnosis present

## 2017-08-02 DIAGNOSIS — Z01818 Encounter for other preprocedural examination: Secondary | ICD-10-CM

## 2017-08-02 DIAGNOSIS — F319 Bipolar disorder, unspecified: Secondary | ICD-10-CM | POA: Insufficient documentation

## 2017-08-02 DIAGNOSIS — R45851 Suicidal ideations: Secondary | ICD-10-CM | POA: Insufficient documentation

## 2017-08-02 DIAGNOSIS — F1019 Alcohol abuse with unspecified alcohol-induced disorder: Secondary | ICD-10-CM | POA: Insufficient documentation

## 2017-08-02 DIAGNOSIS — I10 Essential (primary) hypertension: Secondary | ICD-10-CM | POA: Insufficient documentation

## 2017-08-02 DIAGNOSIS — F332 Major depressive disorder, recurrent severe without psychotic features: Secondary | ICD-10-CM | POA: Diagnosis present

## 2017-08-02 LAB — COMPREHENSIVE METABOLIC PANEL
ALK PHOS: 61 U/L (ref 38–126)
ALT: 20 U/L (ref 17–63)
ANION GAP: 8 (ref 5–15)
AST: 23 U/L (ref 15–41)
Albumin: 3.9 g/dL (ref 3.5–5.0)
BILIRUBIN TOTAL: 0.5 mg/dL (ref 0.3–1.2)
BUN: 9 mg/dL (ref 6–20)
CALCIUM: 8.3 mg/dL — AB (ref 8.9–10.3)
CO2: 22 mmol/L (ref 22–32)
CREATININE: 1.07 mg/dL (ref 0.61–1.24)
Chloride: 103 mmol/L (ref 101–111)
Glucose, Bld: 92 mg/dL (ref 65–99)
Potassium: 3.8 mmol/L (ref 3.5–5.1)
Sodium: 133 mmol/L — ABNORMAL LOW (ref 135–145)
TOTAL PROTEIN: 6.5 g/dL (ref 6.5–8.1)

## 2017-08-02 LAB — RAPID URINE DRUG SCREEN, HOSP PERFORMED
Amphetamines: NOT DETECTED
Barbiturates: NOT DETECTED
Benzodiazepines: NOT DETECTED
Cocaine: NOT DETECTED
OPIATES: NOT DETECTED
TETRAHYDROCANNABINOL: NOT DETECTED

## 2017-08-02 LAB — CBC
HCT: 37.2 % — ABNORMAL LOW (ref 39.0–52.0)
HEMOGLOBIN: 12.7 g/dL — AB (ref 13.0–17.0)
MCH: 32.3 pg (ref 26.0–34.0)
MCHC: 34.1 g/dL (ref 30.0–36.0)
MCV: 94.7 fL (ref 78.0–100.0)
Platelets: 230 10*3/uL (ref 150–400)
RBC: 3.93 MIL/uL — ABNORMAL LOW (ref 4.22–5.81)
RDW: 14.6 % (ref 11.5–15.5)
WBC: 8.3 10*3/uL (ref 4.0–10.5)

## 2017-08-02 LAB — ACETAMINOPHEN LEVEL

## 2017-08-02 LAB — ETHANOL: ALCOHOL ETHYL (B): 185 mg/dL — AB (ref <5)

## 2017-08-02 LAB — SALICYLATE LEVEL

## 2017-08-02 MED ORDER — LORAZEPAM 2 MG/ML IJ SOLN
0.0000 mg | Freq: Two times a day (BID) | INTRAMUSCULAR | Status: DC
Start: 2017-08-05 — End: 2017-08-03

## 2017-08-02 MED ORDER — THIAMINE HCL 100 MG/ML IJ SOLN: 100.0000 mg | Freq: Every day | INTRAMUSCULAR | Status: DC

## 2017-08-02 MED ORDER — LORAZEPAM 1 MG PO TABS: 0.0000 mg | ORAL_TABLET | Freq: Two times a day (BID) | ORAL | Status: DC

## 2017-08-02 MED ORDER — ALUM & MAG HYDROXIDE-SIMETH 200-200-20 MG/5ML PO SUSP: 30.0000 mL | Freq: Four times a day (QID) | ORAL | Status: DC | PRN

## 2017-08-02 MED ORDER — IBUPROFEN 200 MG PO TABS: 600.0000 mg | ORAL_TABLET | Freq: Three times a day (TID) | ORAL | Status: DC | PRN

## 2017-08-02 MED ORDER — DULOXETINE HCL 30 MG PO CPEP
120.0000 mg | ORAL_CAPSULE | Freq: Every day | ORAL | Status: DC
  Administered 2017-08-03: 120 mg via ORAL
  Filled 2017-08-02: qty 4

## 2017-08-02 MED ORDER — ZOLPIDEM TARTRATE 5 MG PO TABS
5.0000 mg | ORAL_TABLET | Freq: Every evening | ORAL | Status: DC | PRN
  Administered 2017-08-02: 5 mg via ORAL
  Filled 2017-08-02: qty 1

## 2017-08-02 MED ORDER — NICOTINE 21 MG/24HR TD PT24
21.0000 mg | MEDICATED_PATCH | Freq: Every day | TRANSDERMAL | Status: DC
  Filled 2017-08-02: qty 1

## 2017-08-02 MED ORDER — VITAMIN B-1 100 MG PO TABS
100.0000 mg | ORAL_TABLET | Freq: Every day | ORAL | Status: DC
  Administered 2017-08-02 – 2017-08-03 (×2): 100 mg via ORAL
  Filled 2017-08-02 (×2): qty 1

## 2017-08-02 MED ORDER — ONDANSETRON HCL 4 MG PO TABS: 4.0000 mg | ORAL_TABLET | Freq: Three times a day (TID) | ORAL | Status: DC | PRN

## 2017-08-02 MED ORDER — HYDROCHLOROTHIAZIDE 25 MG PO TABS
25.0000 mg | ORAL_TABLET | Freq: Every day | ORAL | Status: DC
  Administered 2017-08-03: 25 mg via ORAL
  Filled 2017-08-02: qty 1

## 2017-08-02 MED ORDER — LORAZEPAM 2 MG/ML IJ SOLN: 0.0000 mg | Freq: Four times a day (QID) | INTRAMUSCULAR | Status: DC

## 2017-08-02 MED ORDER — DOLUTEGRAVIR SODIUM 50 MG PO TABS
50.0000 mg | ORAL_TABLET | Freq: Every day | ORAL | Status: DC
  Administered 2017-08-03: 50 mg via ORAL
  Filled 2017-08-02: qty 1

## 2017-08-02 MED ORDER — LORAZEPAM 1 MG PO TABS
0.0000 mg | ORAL_TABLET | Freq: Four times a day (QID) | ORAL | Status: DC
  Administered 2017-08-02: 2 mg via ORAL
  Administered 2017-08-03 (×2): 1 mg via ORAL
  Filled 2017-08-02: qty 1
  Filled 2017-08-02: qty 2
  Filled 2017-08-02: qty 1

## 2017-08-02 MED ORDER — EMTRICITABINE-TENOFOVIR AF 200-25 MG PO TABS
1.0000 | ORAL_TABLET | Freq: Every day | ORAL | Status: DC
  Administered 2017-08-03: 1 via ORAL
  Filled 2017-08-02: qty 1

## 2017-08-02 NOTE — ED Notes (Signed)
Patient refusing to answer questions asked for triage purposes. Triage not completed

## 2017-08-02 NOTE — ED Notes (Signed)
Patient refused lab draw @ this time. 

## 2017-08-02 NOTE — ED Triage Notes (Signed)
Patient brought in by [employer] with hands cuffed. Patient is IVC'd but no paperwork yet. Patient told [employer] that he wanted to kill himself by drinking Clorox. [employer] stated that the patient is not cooperative.[name]

## 2017-08-02 NOTE — ED Notes (Signed)
Pt A&O x 3, no distress noted, calm but stating he wants to leave.   Pt SI plan to drink Clorox bleach.  MOnitoring for safety, Q 15 min checks in effect.  Safety check for contraband completed.  TTS consult with [facility] completed.
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
## 2017-08-02 NOTE — ED Provider Notes (Signed)
[facility] Provider Note   CSN: [id number] Arrival date & time: 08/02/17  1826     History   Chief Complaint Chief Complaint  Patient presents with  . IVC  . Suicidal    HPI Arthur [name] is a 61 y.o. male.  HPI   61 year old male with history of HIV, latent syphilis, bipolar, alcohol abuse brought in by GPD with IVC due to suicidal ideation.Patient states for the past several weeks he has had suicidal thoughts with thought about drinking Clorox to kill himself. He is having auditory hallucination each time he see a glass jar he wants to injure himself with it. He is sleeping more, eating less. He has history of bipolar but currently not taking any medication for that. He is not having homicidal ideation. He did discuss this depression with his therapist yesterday who right IVC paper and had GPD to bring patient here for further valuation. Patient would like to go home. He denies having any physical pain. He admits to drinking alcohol on regular basis last use was today. Denies any illicit drug use.  Past Medical History:  Diagnosis Date  . Alcohol abuse 12/21/2016  . Alcoholism (HCC) 06/20/2016  . Bipolar disorder (HCC)   . Cough 11/17/2015  . Depression   . Diarrhea 11/25/2015  . Difficulty voiding 12/15/2015  . Dysuria 11/25/2015  . Fever, unspecified 11/17/2015  . Flank pain 11/25/2015  . History of blood transfusion 12-12-1998  . HIV infection (HCC)   . HTN (hypertension) 11/17/2016  . 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 Late syphilis   . Lower back pain 11/25/2015  . 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 TIA (transient ischemic attack) 11/17/2016    Patient Active Problem List   Diagnosis Date Noted  . Alcohol abuse 12/21/2016  . 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 TIA (transient ischemic attack) 11/17/2016  . HTN (hypertension) 11/17/2016  . 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  Alcoholism (HCC) 06/20/2016  . 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 Hypoglycemia 04/05/2016  . Difficulty voiding 12/15/2015  . Diarrhea 11/25/2015  . Flank pain 11/25/2015  . Dysuria 11/25/2015  . Lower back pain 11/25/2015  . Loss of weight 11/17/2015  . Fever, unspecified 11/17/2015  . Cough 11/17/2015  . History of alcohol abuse 08/31/2015  . Late syphilis   . Clinical depression 11/27/2014  . HIV disease (HCC) 11/12/2014  . Bipolar 1 disorder (HCC) 05/15/2012  . 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 Elevated fasting blood sugar 05/15/2012  . H/O surgical procedure 05/15/2012  . Lung mass 05/15/2012  . Bipolar I disorder (HCC) 05/15/2012  . History of surgical procedure 05/15/2012    Past Surgical History:  Procedure Laterality Date  . broken right leg Right 2000  . FRACTURE SURGERY    . GASTRIC BYPASS  2012   [facility]       Home Medications    Prior to Admission medications   Medication Sig Start Date End Date Taking? Authorizing Provider  aspirin 81 MG tablet Take 1 tablet (81 mg total) by mouth daily. 06/16/17   Randall Hiss, MD  DESCOVY 200-25 MG tablet TAKE 1 TABLET BY MOUTH DAILY 05/12/17   Daiva Eves, Lisette Grinder, MD  dolutegravir (TIVICAY) 50 MG tablet Take 1 tablet (50 mg total) by mouth daily. 06/05/17   Randall Hiss, MD  DULoxetine (CYMBALTA) 60 MG capsule Take 2 capsules (120 mg total) by mouth daily.  06/09/17   Randall Hiss, MD  hydrochlorothiazide (HYDRODIURIL) 25 MG tablet Take 1 tablet (25 mg total) by mouth daily. Patient not taking: Reported on 06/08/2017 11/17/16   Daiva Eves, Lisette Grinder, MD  naproxen (NAPROSYN) 375 MG tablet Take 375 mg by mouth 2 (two) times daily as needed for mild pain.     [provider]  rosuvastatin (CRESTOR) 10 MG tablet Take 1 tablet (10 mg total) by mouth daily. 06/16/17   Randall Hiss, MD  sildenafil (VIAGRA) 100 MG tablet Take 1 tablet (100 mg total) by mouth  daily as needed for erectile dysfunction. Come through patient assistance 05/04/16   Ginnie Smart, MD  tamsulosin (FLOMAX) 0.4 MG CAPS capsule Take 1 capsule (0.4 mg total) by mouth 2 (two) times daily. 12/14/16   Randall Hiss, MD    Family History Family History  Problem Relation Age of Onset  . Heart disease Mother   . Stroke Mother   . Heart disease Father     Social History Social History  Substance Use Topics  . Smoking status: Never Smoker  . Smokeless tobacco: Never Used  . Alcohol use 50.4 oz/week    84 Cans of beer per week     Comment: quit 01/15/15     Allergies   Patient has no known allergies.   Review of Systems Review of Systems  All other systems reviewed and are negative.    Physical Exam Updated Vital Signs BP 102/71 (BP Location: Right Arm)   Pulse 83   Temp 99 F (37.2 C) (Oral)   Resp 16   SpO2 99%   Physical Exam  Constitutional: He is oriented to person, place, and time. He appears well-developed and well-nourished. No distress.  Patient standing leaning against the wall in no acute discomfort  HENT:  Head: Atraumatic.  Eyes: Conjunctivae are normal.  Neck: Neck supple.  Cardiovascular: Normal rate and regular rhythm.   Pulmonary/Chest: Effort normal and breath sounds normal.  Abdominal: Soft. He exhibits no distension. There is no tenderness.  Neurological: He is alert and oriented to person, place, and time. No cranial nerve deficit or sensory deficit. GCS eye subscore is 4. GCS verbal subscore is 5. GCS motor subscore is 6.  Skin: No rash noted.  Psychiatric: His speech is normal. He is withdrawn. Thought content is not paranoid. Cognition and memory are normal. He expresses impulsivity. He exhibits a depressed mood. He expresses suicidal ideation. He expresses no homicidal ideation.  Nursing note and vitals reviewed.    ED Treatments / Results  Labs (all labs ordered are listed, but only abnormal results are  displayed) Labs Reviewed  CBC - Abnormal; Notable for the following:       Result Value   RBC 3.93 (*)    Hemoglobin 12.7 (*)    HCT 37.2 (*)    All other components within normal limits  COMPREHENSIVE METABOLIC PANEL  ETHANOL  SALICYLATE LEVEL  ACETAMINOPHEN LEVEL  RAPID URINE DRUG SCREEN, HOSP PERFORMED    EKG  EKG Interpretation None       Radiology No results found.  Procedures Procedures (including critical care time)  Medications Ordered in ED Medications  LORazepam (ATIVAN) injection 0-4 mg (not administered)    Or  LORazepam (ATIVAN) tablet 0-4 mg (not administered)  LORazepam (ATIVAN) injection 0-4 mg (not administered)    Or  LORazepam (ATIVAN) tablet 0-4 mg (not administered)  thiamine (VITAMIN B-1) tablet 100 mg (not  administered)    Or  thiamine (B-1) injection 100 mg (not administered)  ibuprofen (ADVIL,MOTRIN) tablet 600 mg (not administered)  zolpidem (AMBIEN) tablet 5 mg (not administered)  ondansetron (ZOFRAN) tablet 4 mg (not administered)  alum & mag hydroxide-simeth (MAALOX/MYLANTA) 200-200-20 MG/5ML suspension 30 mL (not administered)  nicotine (NICODERM CQ - dosed in mg/24 hours) patch 21 mg (not administered)  emtricitabine-tenofovir AF (DESCOVY) 200-25 MG per tablet 1 tablet (not administered)  dolutegravir (TIVICAY) tablet 50 mg (not administered)  DULoxetine (CYMBALTA) DR capsule 120 mg (not administered)  hydrochlorothiazide (HYDRODIURIL) tablet 25 mg (not administered)     Initial Impression / Assessment and Plan / ED Course  I have reviewed the triage vital signs and the nursing notes.  Pertinent labs & imaging results that were available during my care of the patient were reviewed by me and considered in my medical decision making (see chart for details).     BP 102/71 (BP Location: Right Arm)   Pulse 83   Temp 99 F (37.2 C) (Oral)   Resp 16   SpO2 99%    Final Clinical Impressions(s) / ED Diagnoses   Final diagnoses:   Suicidal ideation  Alcohol abuse    New Prescriptions New Prescriptions   No medications on file   7:31 PM Pt here with IVC paper due to having SI with plan to drink Clorox.  Also have command auditory hallucination, hx of bipolar not on meds.  He is medically cleared, will consult TTS for further psychiatric assessment.    10:17 PM Care discussed with dr. Eudelia Bunch who will see pt and file IVC paper.  TTS has evaluated pt and felt pt meets criteria for inpt treatment at [facility].     Fayrene Helper, PA-C 08/02/17 2218    Nira Conn, MD 08/02/17 2255

## 2017-08-03 ENCOUNTER — Encounter (HOSPITAL_COMMUNITY): Payer: Self-pay | Admitting: *Deleted

## 2017-08-03 ENCOUNTER — Emergency Department (HOSPITAL_COMMUNITY): Payer: Self-pay

## 2017-08-03 ENCOUNTER — Inpatient Hospital Stay (HOSPITAL_COMMUNITY)
Admission: AD | Admit: 2017-08-03 | Discharge: 2017-08-13 | DRG: 885 | Disposition: A | Discharge status: Home / Self Care | Payer: Self-pay | Attending: Psychiatry | Admitting: Psychiatry

## 2017-08-03 ENCOUNTER — Encounter: Payer: Self-pay | Admitting: *Deleted

## 2017-08-03 ENCOUNTER — Other Ambulatory Visit: Payer: Self-pay

## 2017-08-03 DIAGNOSIS — F1020 Alcohol dependence, uncomplicated: Secondary | ICD-10-CM

## 2017-08-03 DIAGNOSIS — Z9884 Bariatric surgery status: Secondary | ICD-10-CM

## 2017-08-03 DIAGNOSIS — F419 Anxiety disorder, unspecified: Secondary | ICD-10-CM

## 2017-08-03 DIAGNOSIS — Z79899 Other long term (current) drug therapy: Secondary | ICD-10-CM

## 2017-08-03 DIAGNOSIS — R440 Auditory hallucinations: Secondary | ICD-10-CM | POA: Diagnosis present

## 2017-08-03 DIAGNOSIS — I10 Essential (primary) hypertension: Secondary | ICD-10-CM | POA: Diagnosis present

## 2017-08-03 DIAGNOSIS — I2510 Atherosclerotic heart disease of native coronary artery without angina pectoris: Secondary | ICD-10-CM

## 2017-08-03 DIAGNOSIS — R45851 Suicidal ideations: Secondary | ICD-10-CM

## 2017-08-03 DIAGNOSIS — F332 Major depressive disorder, recurrent severe without psychotic features: Principal | ICD-10-CM | POA: Diagnosis present

## 2017-08-03 DIAGNOSIS — F1024 Alcohol dependence with alcohol-induced mood disorder: Secondary | ICD-10-CM | POA: Diagnosis present

## 2017-08-03 DIAGNOSIS — B20 Human immunodeficiency virus [HIV] disease: Secondary | ICD-10-CM | POA: Diagnosis present

## 2017-08-03 DIAGNOSIS — Z8673 Personal history of transient ischemic attack (TIA), and cerebral infarction without residual deficits: Secondary | ICD-10-CM

## 2017-08-03 DIAGNOSIS — F333 Major depressive disorder, recurrent, severe with psychotic symptoms: Secondary | ICD-10-CM

## 2017-08-03 DIAGNOSIS — Z7982 Long term (current) use of aspirin: Secondary | ICD-10-CM

## 2017-08-03 LAB — URINALYSIS, ROUTINE W REFLEX MICROSCOPIC
BILIRUBIN URINE: NEGATIVE
Glucose, UA: NEGATIVE mg/dL
HGB URINE DIPSTICK: NEGATIVE
Ketones, ur: NEGATIVE mg/dL
Leukocytes, UA: NEGATIVE
NITRITE: NEGATIVE
PROTEIN: NEGATIVE mg/dL
Specific Gravity, Urine: 1.005 (ref 1.005–1.030)
pH: 6 (ref 5.0–8.0)

## 2017-08-03 MED ORDER — TRAZODONE HCL 50 MG PO TABS
ORAL_TABLET | ORAL | Status: AC
  Filled 2017-08-03: qty 1

## 2017-08-03 MED ORDER — ACETAMINOPHEN 325 MG PO TABS: 650.0000 mg | ORAL_TABLET | Freq: Four times a day (QID) | ORAL | Status: DC | PRN

## 2017-08-03 MED ORDER — TRAZODONE HCL 50 MG PO TABS
50.0000 mg | ORAL_TABLET | Freq: Every evening | ORAL | Status: DC | PRN
  Administered 2017-08-03 – 2017-08-07 (×6): 50 mg via ORAL
  Filled 2017-08-03 (×14): qty 1

## 2017-08-03 MED ORDER — ASPIRIN 81 MG PO CHEW
81.0000 mg | CHEWABLE_TABLET | Freq: Every day | ORAL | Status: DC
  Administered 2017-08-04 – 2017-08-13 (×10): 81 mg via ORAL
  Filled 2017-08-03: qty 1
  Filled 2017-08-03: qty 7
  Filled 2017-08-03 (×6): qty 1
  Filled 2017-08-03: qty 7
  Filled 2017-08-03 (×5): qty 1

## 2017-08-03 MED ORDER — LORAZEPAM 1 MG PO TABS
1.0000 mg | ORAL_TABLET | Freq: Four times a day (QID) | ORAL | Status: AC | PRN
  Administered 2017-08-05: 1 mg via ORAL
  Filled 2017-08-03 (×2): qty 1

## 2017-08-03 MED ORDER — HYDROXYZINE HCL 25 MG PO TABS
25.0000 mg | ORAL_TABLET | Freq: Four times a day (QID) | ORAL | Status: AC | PRN
  Administered 2017-08-06: 25 mg via ORAL
  Filled 2017-08-03: qty 1

## 2017-08-03 MED ORDER — LORAZEPAM 2 MG/ML IJ SOLN: 0.0000 mg | Freq: Two times a day (BID) | INTRAMUSCULAR | Status: DC

## 2017-08-03 MED ORDER — LORAZEPAM 2 MG/ML IJ SOLN: 0.0000 mg | Freq: Four times a day (QID) | INTRAMUSCULAR | Status: DC

## 2017-08-03 MED ORDER — BUPROPION HCL ER (XL) 300 MG PO TB24
300.0000 mg | ORAL_TABLET | Freq: Every day | ORAL | Status: DC
  Administered 2017-08-04: 300 mg via ORAL
  Filled 2017-08-03 (×2): qty 1

## 2017-08-03 MED ORDER — LORAZEPAM 1 MG PO TABS
1.0000 mg | ORAL_TABLET | Freq: Four times a day (QID) | ORAL | Status: AC
  Administered 2017-08-03 – 2017-08-05 (×6): 1 mg via ORAL
  Filled 2017-08-03 (×5): qty 1

## 2017-08-03 MED ORDER — ASPIRIN 81 MG PO CHEW
81.0000 mg | CHEWABLE_TABLET | Freq: Every day | ORAL | Status: DC
  Administered 2017-08-03: 81 mg via ORAL
  Filled 2017-08-03: qty 1

## 2017-08-03 MED ORDER — EMTRICITABINE-TENOFOVIR AF 200-25 MG PO TABS
1.0000 | ORAL_TABLET | Freq: Every day | ORAL | Status: DC
  Administered 2017-08-04 – 2017-08-13 (×10): 1 via ORAL
  Filled 2017-08-03: qty 1
  Filled 2017-08-03: qty 7
  Filled 2017-08-03 (×2): qty 1
  Filled 2017-08-03: qty 7
  Filled 2017-08-03 (×8): qty 1

## 2017-08-03 MED ORDER — HYDROCHLOROTHIAZIDE 25 MG PO TABS
25.0000 mg | ORAL_TABLET | Freq: Every day | ORAL | Status: DC
  Administered 2017-08-04 – 2017-08-13 (×10): 25 mg via ORAL
  Filled 2017-08-03 (×6): qty 1
  Filled 2017-08-03: qty 7
  Filled 2017-08-03 (×4): qty 1
  Filled 2017-08-03: qty 7
  Filled 2017-08-03: qty 1

## 2017-08-03 MED ORDER — IBUPROFEN 600 MG PO TABS: 600.0000 mg | ORAL_TABLET | Freq: Three times a day (TID) | ORAL | Status: DC | PRN

## 2017-08-03 MED ORDER — LORAZEPAM 1 MG PO TABS
1.0000 mg | ORAL_TABLET | Freq: Three times a day (TID) | ORAL | Status: AC
  Administered 2017-08-05 – 2017-08-06 (×3): 1 mg via ORAL
  Filled 2017-08-03 (×4): qty 1

## 2017-08-03 MED ORDER — DULOXETINE HCL 60 MG PO CPEP
120.0000 mg | ORAL_CAPSULE | Freq: Every day | ORAL | Status: DC
  Administered 2017-08-04 – 2017-08-13 (×10): 120 mg via ORAL
  Filled 2017-08-03: qty 14
  Filled 2017-08-03 (×10): qty 2
  Filled 2017-08-03: qty 14
  Filled 2017-08-03: qty 2

## 2017-08-03 MED ORDER — TAMSULOSIN HCL 0.4 MG PO CAPS
0.4000 mg | ORAL_CAPSULE | Freq: Two times a day (BID) | ORAL | Status: DC
  Administered 2017-08-03: 0.4 mg via ORAL
  Filled 2017-08-03 (×2): qty 1

## 2017-08-03 MED ORDER — TAMSULOSIN HCL 0.4 MG PO CAPS
0.4000 mg | ORAL_CAPSULE | Freq: Two times a day (BID) | ORAL | Status: DC
  Administered 2017-08-04 – 2017-08-13 (×19): 0.4 mg via ORAL
  Filled 2017-08-03 (×4): qty 1
  Filled 2017-08-03: qty 14
  Filled 2017-08-03 (×4): qty 1
  Filled 2017-08-03: qty 14
  Filled 2017-08-03 (×9): qty 1
  Filled 2017-08-03: qty 14
  Filled 2017-08-03 (×3): qty 1
  Filled 2017-08-03: qty 14
  Filled 2017-08-03 (×3): qty 1

## 2017-08-03 MED ORDER — NICOTINE 21 MG/24HR TD PT24
21.0000 mg | MEDICATED_PATCH | Freq: Every day | TRANSDERMAL | Status: DC
  Filled 2017-08-03 (×2): qty 1

## 2017-08-03 MED ORDER — LORAZEPAM 1 MG PO TABS: 0.0000 mg | ORAL_TABLET | Freq: Four times a day (QID) | ORAL | Status: DC

## 2017-08-03 MED ORDER — LORAZEPAM 1 MG PO TABS: 0.0000 mg | ORAL_TABLET | Freq: Two times a day (BID) | ORAL | Status: DC

## 2017-08-03 MED ORDER — ADULT MULTIVITAMIN W/MINERALS CH
1.0000 | ORAL_TABLET | Freq: Every day | ORAL | Status: DC
  Administered 2017-08-04 – 2017-08-13 (×10): 1 via ORAL
  Filled 2017-08-03 (×12): qty 1

## 2017-08-03 MED ORDER — BUPROPION HCL ER (XL) 150 MG PO TB24
300.0000 mg | ORAL_TABLET | Freq: Every day | ORAL | Status: DC
  Administered 2017-08-03: 300 mg via ORAL
  Filled 2017-08-03: qty 2

## 2017-08-03 MED ORDER — VITAMIN B-1 100 MG PO TABS
100.0000 mg | ORAL_TABLET | Freq: Every day | ORAL | Status: DC
  Administered 2017-08-04 – 2017-08-13 (×10): 100 mg via ORAL
  Filled 2017-08-03 (×13): qty 1

## 2017-08-03 MED ORDER — LORAZEPAM 1 MG PO TABS
1.0000 mg | ORAL_TABLET | Freq: Every day | ORAL | Status: AC
  Administered 2017-08-08: 1 mg via ORAL
  Filled 2017-08-03: qty 1

## 2017-08-03 MED ORDER — LORAZEPAM 1 MG PO TABS
1.0000 mg | ORAL_TABLET | Freq: Two times a day (BID) | ORAL | Status: AC
  Administered 2017-08-06 – 2017-08-07 (×3): 1 mg via ORAL
  Filled 2017-08-03: qty 1

## 2017-08-03 MED ORDER — HYDROCORTISONE 1 % EX CREA
TOPICAL_CREAM | Freq: Two times a day (BID) | CUTANEOUS | Status: DC | PRN
  Filled 2017-08-03: qty 28

## 2017-08-03 MED ORDER — DOLUTEGRAVIR SODIUM 50 MG PO TABS
50.0000 mg | ORAL_TABLET | Freq: Every day | ORAL | Status: DC
  Administered 2017-08-04 – 2017-08-13 (×10): 50 mg via ORAL
  Filled 2017-08-03 (×6): qty 1
  Filled 2017-08-03: qty 7
  Filled 2017-08-03 (×2): qty 1
  Filled 2017-08-03: qty 7
  Filled 2017-08-03 (×3): qty 1

## 2017-08-03 MED ORDER — ONDANSETRON HCL 4 MG PO TABS: 4.0000 mg | ORAL_TABLET | Freq: Three times a day (TID) | ORAL | Status: DC | PRN

## 2017-08-03 MED ORDER — HYDROCORTISONE 1 % EX CREA
TOPICAL_CREAM | Freq: Two times a day (BID) | CUTANEOUS | Status: DC | PRN
  Filled 2017-08-03 (×2): qty 28

## 2017-08-03 MED ORDER — ALUM & MAG HYDROXIDE-SIMETH 200-200-20 MG/5ML PO SUSP: 30.0000 mL | Freq: Four times a day (QID) | ORAL | Status: DC | PRN

## 2017-08-03 MED ORDER — MAGNESIUM HYDROXIDE 400 MG/5ML PO SUSP
30.0000 mL | Freq: Every day | ORAL | Status: DC | PRN
  Administered 2017-08-12 – 2017-08-13 (×2): 30 mL via ORAL
  Filled 2017-08-03 (×2): qty 30

## 2017-08-03 MED ORDER — LOPERAMIDE HCL 2 MG PO CAPS: 2.0000 mg | ORAL_CAPSULE | ORAL | Status: AC | PRN

## 2017-08-03 NOTE — Progress Notes (Signed)
Report received from admitting RN.  Introduced self to pt.  Pt denies HI, denies pain.  He reports recurring thoughts of drinking bleach to clean himself.  He reports he has taken Depakote and Lamictal in the past and may want to restart these medications.  Pt requests medication for sleep.  Medication administered per order except for Flomax, which was unavailable at the time.  Pt verbally contracts for safety.  He reports he will inform staff of needs and concerns.  Will continue to monitor and assess.

## 2017-08-03 NOTE — Plan of Care (Signed)
Problem: Safety: Goal: Periods of time without injury will increase Outcome: Progressing Pt has not harmed self or others tonight and he verbally contracts for safety.

## 2017-08-03 NOTE — Progress Notes (Signed)
Jefferson is a 61 year old male pt admitted on involuntary basis after presenting to the emergency department complaining of voices that are telling him to drink bleach so he can cleanse himself. He does endorse this on admission and reports that he knows it doesn't make sense but it's just how he feels. He reports that he drinks alcohol to make the voices quiet and reports that he drinks on a daily basis. He reports that he was diagnosed as Bipolar in the past and was prescribed medication that he was taking but is not taking now and reports that he could not afford the medications at the time. He reports that he also wants to find something to help with his auditory hallucinations other than alcohol. On admission, Marshell appears cooperative but anxious and does not appear to have any overt signs or symptoms of withdrawal. He denied any SI on admission and is able to contract for safety while in the hospital. Sevrin was oriented to the unit and safety maintained.

## 2017-08-03 NOTE — ED Notes (Signed)
Pt A&O x 3, no distress noted, calm & cooperative.  Monitoring for safety, Q 15 min checks in effect.  Pending report & [facility] transfer.  Pt resting at present.

## 2017-08-03 NOTE — BH Assessment (Signed)
[facility] Assessment Progress Note  Per Thedore Mins, MD, this pt requires psychiatric hospitalization.  Berneice Heinrich, RN, [facility] has assigned pt to [facility] Rm [id number]; they will be ready to receive pt at 18:30.  Pt presents under IVC initiated by his counselor, and upheld by Dr Jannifer Franklin, and IVC documents have been faxed to [facility].  Pt's nurse, Diane, has been notified, and agrees to call report to [phone].  Pt is to be transported via [employer].   Doylene Canning, MA Triage Specialist [phone]

## 2017-08-03 NOTE — ED Notes (Signed)
Introduced self to patient. Pt oriented to unit expectations.  Assessed pt for:  A) Anxiety &/or agitation: Pt has been calm and cooperative. He said that he has obsessive thoughts, or voices, he is not sure which, telling him drink bleach. He intends to. He things that it will cleans him of his disease and his sexual orientation. He knows that it will not, but he cannot dispel the thoughts.   S) Safety: Safety maintained with q-15-minute checks and hourly rounds by staff.  A) ADLs: Pt able to perform ADLs independently.  P) Pick-Up (room cleanliness): Pt's room clean and free of clutter.

## 2017-08-03 NOTE — Consult Note (Signed)
[facility] Consult   Reason for Consult:  Psychiatric evaluation Referring Physician:  ED Patient Identification: Arthur [name] MRN:  [id number] Principal Diagnosis: Severe recurrent major depression with psychotic features [facility]) Diagnosis:   Patient Active Problem List   Diagnosis Date Noted  . Alcohol use disorder, severe, dependence ([place]) [F10.20] 08/03/2017    Priority: High  . Severe recurrent major depression with psychotic features ([place]) [F33.3] 06/20/2016    Priority: High  . Alcohol abuse [F10.10] 12/21/2016  . Alcohol-induced mood disorder ([place]) [F10.94] 12/20/2016  . TIA (transient ischemic attack) [G45.9] 11/17/2016  . HTN (hypertension) [I10] 11/17/2016  . 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 Alcoholism ([place]) [F10.20] 06/20/2016  . Hypoglycemia [E16.2] 04/05/2016  . Difficulty voiding [R39.198] 12/15/2015  . Diarrhea [R19.7] 11/25/2015  . Flank pain [R10.9] 11/25/2015  . Dysuria [R30.0] 11/25/2015  . Lower back pain [M54.5] 11/25/2015  . Loss of weight [R63.4] 11/17/2015  . Fever, unspecified [R50.9] 11/17/2015  . Cough [R05] 11/17/2015  . History of alcohol abuse [Z87.898] 08/31/2015  . 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 Clinical depression [F32.9] 11/27/2014  . HIV disease ([place]) [B20] 11/12/2014  . Bipolar 1 disorder ([place]) [F31.9] 05/15/2012  . 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 Elevated fasting blood sugar [R73.01] 05/15/2012  . H/O surgical procedure [Z98.890] 05/15/2012  . Lung mass [R91.8] 05/15/2012  . Bipolar I disorder ([place]) [F31.9] 05/15/2012  . History of surgical procedure [Z98.890] 05/15/2012    Total Time spent with patient: 45 minutes  Subjective:   Arthur [name] is a 61 y.o. male patient admitted with depression and suicidal thoughts.  HPI:  Patient with history of Major depression, HIV, latent syphilis, severe alcohol use disorder who was IVC'd by his counselor due to  suicidal ideation.Patient reports that he has been getting increasingly depressed, hopeless, helpless and has stopped taking care of his personal hygiene. Patient reports recurrent suicidal thoughts and has been having command auditory hallucination. "I have been hearing voices  telling me to drink Clorox to get clean from HIV, Alcohol AND BEING gay.'' Patient reports that he has been drinking lots of alcohol on a daily basis to suppress depression but denies other illicit drug use.  Past Psychiatric History: as above  Risk to Self: Suicidal Ideation: Yes-Currently Present Suicidal Intent: Yes-Currently Present Is patient at risk for suicide?: Yes Suicidal Plan?: Yes-Currently Present Specify Current Suicidal Plan:  (PER AH PT STS HE PLANNED TO DRINK CLOROX "TO GET CLEAN") Access to Means: Yes What has been your use of drugs/alcohol within the last 12 months?:  (DAILY USE) Other Self Harm Risks:  (NONE REPORTED) Triggers for Past Attempts: Unknown Intentional Self Injurious Behavior: None Risk to Others: Homicidal Ideation: No (DENIES) Thoughts of Harm to Others: No (DENIES) Current Homicidal Intent: No Current Homicidal Plan: No Access to Homicidal Means: No Identified Victim:  (NONE) History of harm to others?: No Assessment of Violence: None Noted Does patient have access to weapons?: No Criminal Charges Pending?: No Does patient have a court date: No Prior Inpatient Therapy: Prior Inpatient Therapy: Yes Prior Therapy Dates:  (12/20/16) Prior Therapy Facilty/Provider(s):  ([facility]) Reason for Treatment:  (BIPOLAR I; SA) Prior Outpatient Therapy: Prior Outpatient Therapy: Yes Prior Therapy Dates:  (UNTIL LAST FEW MONTHS) Prior Therapy Facilty/Provider(s):  (KEN[facility], LCSW) Reason for Treatment:  (BIPOLAR I; PTSD; SA) Does patient have an ACCT team?: No Does patient have Intensive In-House Services?  : No Does patient have [employer]  services? : No Does patient have P4[facility]  services?: No  Past Medical History:  Past Medical History:  Diagnosis Date  . Alcohol abuse 12/21/2016  . Alcoholism ([place]) 06/20/2016  . Bipolar disorder ([place])   . Cough 11/17/2015  . Depression   . Diarrhea 11/25/2015  . Difficulty voiding 12/15/2015  . Dysuria 11/25/2015  . Fever, unspecified 11/17/2015  . Flank pain 11/25/2015  . History of blood transfusion 12-12-1998  . HIV infection ([place])   . HTN (hypertension) 11/17/2016  . 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 Late syphilis   . Lower back pain 11/25/2015  . Recurrent major depression-severe ([place]) 06/20/2016  . TIA (transient ischemic attack) 11/17/2016    Past Surgical History:  Procedure Laterality Date  . broken right leg Right 2000  . FRACTURE SURGERY    . GASTRIC BYPASS  2012   [facility]   Family History:  Family History  Problem Relation Age of Onset  . Heart disease Mother   . Stroke Mother   . Heart disease Father    Family Psychiatric  History:  Social History:  History  Alcohol Use  . 50.4 oz/week  . 92 Cans of beer per week    Comment: quit 01/15/15     History  Drug Use No    Social History   Social History  . Marital status: Single    Spouse name: N/A  . Number of children: N/A  . Years of education: N/A   Social History Main Topics  . Smoking status: Never Smoker  . Smokeless tobacco: Never Used  . Alcohol use 50.4 oz/week    84 Cans of beer per week     Comment: quit 01/15/15  . Drug use: No  . Sexual activity: Not Currently    Partners: Male   Other Topics Concern  . Not on file   Social History Narrative  . No narrative on file   Additional Social History:    Allergies:  No Known Allergies  Labs:  Results for orders placed or performed during the hospital encounter of 08/02/17 (from the past 48 hour(s))  Comprehensive metabolic panel     Status: Abnormal   Collection Time: 08/02/17  7:15 PM  Result Value Ref Range   Sodium 133 (L) 135 - 145 mmol/L    Potassium 3.8 3.5 - 5.1 mmol/L   Chloride 103 101 - 111 mmol/L   CO2 22 22 - 32 mmol/L   Glucose, Bld 92 65 - 99 mg/dL   BUN 9 6 - 20 mg/dL   Creatinine, Ser 1.07 0.61 - 1.24 mg/dL   Calcium 8.3 (L) 8.9 - 10.3 mg/dL   Total Protein 6.5 6.5 - 8.1 g/dL   Albumin 3.9 3.5 - 5.0 g/dL   AST 23 15 - 41 U/L   ALT 20 17 - 63 U/L   Alkaline Phosphatase 61 38 - 126 U/L   Total Bilirubin 0.5 0.3 - 1.2 mg/dL   GFR calc non Af Amer >60 >60 mL/min   GFR calc Af Amer >60 >60 mL/min    Comment: (NOTE) The eGFR has been calculated using the CKD EPI equation. This calculation has not been validated in all clinical situations. eGFR's persistently <60 mL/min signify possible Chronic Kidney Disease.    Anion gap 8 5 - 15  cbc     Status: Abnormal   Collection Time: 08/02/17  7:15 PM  Result Value Ref Range   WBC 8.3 4.0 - 10.5 K/uL  RBC 3.93 (L) 4.22 - 5.81 MIL/uL   Hemoglobin 12.7 (L) 13.0 - 17.0 g/dL   HCT 37.2 (L) 39.0 - 52.0 %   MCV 94.7 78.0 - 100.0 fL   MCH 32.3 26.0 - 34.0 pg   MCHC 34.1 30.0 - 36.0 g/dL   RDW 14.6 11.5 - 15.5 %   Platelets 230 150 - 400 K/uL  Ethanol     Status: Abnormal   Collection Time: 08/02/17  7:16 PM  Result Value Ref Range   Alcohol, Ethyl (B) 185 (H) <5 mg/dL    Comment:        LOWEST DETECTABLE LIMIT FOR SERUM ALCOHOL IS 5 mg/dL FOR MEDICAL PURPOSES ONLY   Salicylate level     Status: None   Collection Time: 08/02/17  7:16 PM  Result Value Ref Range   Salicylate Lvl <5.9 2.8 - 30.0 mg/dL  Acetaminophen level     Status: Abnormal   Collection Time: 08/02/17  7:16 PM  Result Value Ref Range   Acetaminophen (Tylenol), Serum <10 (L) 10 - 30 ug/mL    Comment:        THERAPEUTIC CONCENTRATIONS VARY SIGNIFICANTLY. A RANGE OF 10-30 ug/mL MAY BE AN EFFECTIVE CONCENTRATION FOR MANY PATIENTS. HOWEVER, SOME ARE BEST TREATED AT CONCENTRATIONS OUTSIDE THIS RANGE. ACETAMINOPHEN CONCENTRATIONS >150 ug/mL AT 4 HOURS AFTER INGESTION AND >50 ug/mL AT 12 HOURS  AFTER INGESTION ARE OFTEN ASSOCIATED WITH TOXIC REACTIONS.   Rapid urine drug screen (hospital performed)     Status: None   Collection Time: 08/02/17  7:46 PM  Result Value Ref Range   Opiates NONE DETECTED NONE DETECTED   Cocaine NONE DETECTED NONE DETECTED   Benzodiazepines NONE DETECTED NONE DETECTED   Amphetamines NONE DETECTED NONE DETECTED   Tetrahydrocannabinol NONE DETECTED NONE DETECTED   Barbiturates NONE DETECTED NONE DETECTED    Comment:        DRUG SCREEN FOR MEDICAL PURPOSES ONLY.  IF CONFIRMATION IS NEEDED FOR ANY PURPOSE, NOTIFY LAB WITHIN 5 DAYS.        LOWEST DETECTABLE LIMITS FOR URINE DRUG SCREEN Drug Class       Cutoff (ng/mL) Amphetamine      1000 Barbiturate      200 Benzodiazepine   163 Tricyclics       846 Opiates          300 Cocaine          300 THC              50     Current Facility-Administered Medications  Medication Dose Route Frequency Provider Last Rate Last Dose  . alum & mag hydroxide-simeth (MAALOX/MYLANTA) 200-200-20 MG/5ML suspension 30 mL  30 mL Oral Q6H PRN Domenic Moras, PA-C      . aspirin tablet 81 mg  81 mg Oral Daily Onda Kattner, MD      . buPROPion (WELLBUTRIN XL) 24 hr tablet 300 mg  300 mg Oral Daily Milagro Belmares, MD      . dolutegravir (TIVICAY) tablet 50 mg  50 mg Oral Daily Domenic Moras, PA-C   50 mg at 08/03/17 1059  . DULoxetine (CYMBALTA) DR capsule 120 mg  120 mg Oral Daily Domenic Moras, PA-C   120 mg at 08/03/17 1059  . emtricitabine-tenofovir AF (DESCOVY) 200-25 MG per tablet 1 tablet  1 tablet Oral Daily Domenic Moras, PA-C   1 tablet at 08/03/17 1059  . hydrochlorothiazide (HYDRODIURIL) tablet 25 mg  25 mg Oral Daily Domenic Moras, PA-C  25 mg at 08/03/17 1059  . ibuprofen (ADVIL,MOTRIN) tablet 600 mg  600 mg Oral Q8H PRN Domenic Moras, PA-C      . LORazepam (ATIVAN) injection 0-4 mg  0-4 mg Intravenous Q6H Domenic Moras, PA-C       Or  . LORazepam (ATIVAN) tablet 0-4 mg  0-4 mg Oral Q6H Domenic Moras, PA-C   1 mg  at 08/03/17 0815  . [START ON 08/05/2017] LORazepam (ATIVAN) injection 0-4 mg  0-4 mg Intravenous Q12H Domenic Moras, PA-C       Or  . Derrill Memo ON 08/05/2017] LORazepam (ATIVAN) tablet 0-4 mg  0-4 mg Oral Q12H Domenic Moras, PA-C      . nicotine (NICODERM CQ - dosed in mg/24 hours) patch 21 mg  21 mg Transdermal Daily Domenic Moras, PA-C      . ondansetron [facility]) tablet 4 mg  4 mg Oral Q8H PRN Domenic Moras, PA-C      . tamsulosin (FLOMAX) capsule 0.4 mg  0.4 mg Oral BID Halo Shevlin, MD      . thiamine (VITAMIN B-1) tablet 100 mg  100 mg Oral Daily Domenic Moras, PA-C   100 mg at 08/03/17 1059   Current Outpatient Prescriptions  Medication Sig Dispense Refill  . buPROPion (WELLBUTRIN) 100 MG tablet Take 1 tablet by mouth 3 (three) times daily.  5  . DESCOVY 200-25 MG tablet TAKE 1 TABLET BY MOUTH DAILY 30 tablet 5  . dolutegravir (TIVICAY) 50 MG tablet Take 1 tablet (50 mg total) by mouth daily. 30 tablet 5  . DULoxetine (CYMBALTA) 60 MG capsule Take 2 capsules (120 mg total) by mouth daily. 60 capsule 3  . rosuvastatin (CRESTOR) 10 MG tablet Take 1 tablet (10 mg total) by mouth daily. 30 tablet 11  . tamsulosin (FLOMAX) 0.4 MG CAPS capsule Take 1 capsule (0.4 mg total) by mouth 2 (two) times daily. 60 capsule 11  . aspirin 81 MG tablet Take 1 tablet (81 mg total) by mouth daily. (Patient not taking: Reported on 08/02/2017) 30 tablet 11  . sildenafil (VIAGRA) 100 MG tablet Take 1 tablet (100 mg total) by mouth daily as needed for erectile dysfunction. Come through patient assistance 10 tablet 5    Musculoskeletal: Strength & Muscle Tone: within normal limits Gait & Station: normal Patient leans: N/A  Psychiatric Specialty Exam: Physical Exam  Psychiatric: His speech is normal. Judgment normal. His affect is blunt. He is actively hallucinating. Cognition and memory are normal. He exhibits a depressed mood. He expresses suicidal ideation. He expresses suicidal plans.    Review of Systems   Constitutional: Negative.   HENT: Negative.   Eyes: Negative.   Respiratory: Negative.   Cardiovascular: Negative.   Gastrointestinal: Negative.   Genitourinary: Negative.   Musculoskeletal: Negative.   Skin: Negative.   Neurological: Negative.   Endo/Heme/Allergies: Negative.   Psychiatric/Behavioral: Positive for depression and suicidal ideas. The patient is nervous/anxious.     Blood pressure (!) 145/83, pulse 70, temperature 97.8 F (36.6 C), temperature source Oral, resp. rate 16, SpO2 97 %.There is no height or weight on file to calculate BMI.  General Appearance: Casual  Eye Contact:  Good  Speech:  Clear and Coherent  Volume:  Decreased  Mood:  Depressed and Hopeless  Affect:  Constricted  Thought Process:  Coherent  Orientation:  Full (Time, Place, and Person)  Thought Content:  Logical  Suicidal Thoughts:  Yes.  with intent/plan  Homicidal Thoughts:  No  Memory:  Immediate;  Fair Recent;   Fair Remote;   Fair  Judgement:  Intact  Insight:  Shallow  Psychomotor Activity:  Psychomotor Retardation  Concentration:  Concentration: Fair and Attention Span: Fair  Recall:  Good  Fund of Knowledge:  Good  Language:  Good  Akathisia:  No  Handed:  Right  AIMS (if indicated):     Assets:  Communication Skills  ADL's:  Intact  Cognition:  WNL  Sleep:   poor     Treatment Plan Summary: Daily contact with patient to assess and evaluate symptoms and progress in treatment and Medication management  Continue Cymbalta 120 mg daily and Wellbutrin XL 346m daily for depression  Disposition: Recommend psychiatric Inpatient admission when medically cleared.  ACorena Pilgrim MD 08/03/2017 11:17 AM

## 2017-08-03 NOTE — Tx Team (Signed)
Initial Treatment Plan 08/03/2017 10:38 PM Arthur [name] LKG:[id number]    PATIENT STRESSORS: Medication change or noncompliance Substance abuse Other: auditory hallucinations   PATIENT STRENGTHS: Ability for insight Average or above average intelligence Capable of independent living General fund of knowledge Motivation for treatment/growth   PATIENT IDENTIFIED PROBLEMS: Depression Substance Abuse Suicidal thoughts Auditory hallucinations "Not taking medicines for my bipolar disorder" "Find something other than alcohol to help with the voices"                     DISCHARGE CRITERIA:  Ability to meet basic life and health needs Improved stabilization in mood, thinking, and/or behavior Verbal commitment to aftercare and medication compliance Withdrawal symptoms are absent or subacute and managed without 24-hour nursing intervention  PRELIMINARY DISCHARGE PLAN: Attend aftercare/continuing care group Return to previous living arrangement  PATIENT/FAMILY INVOLVEMENT: This treatment plan has been presented to and reviewed with the patient, Arthur [name], and/or family member, .  The patient and family have been given the opportunity to ask questions and make suggestions.  Arthur [name], [place], [place] 08/03/2017, 10:38 PM

## 2017-08-03 NOTE — ED Notes (Signed)
[facility] transport requested. 

## 2017-08-04 DIAGNOSIS — F332 Major depressive disorder, recurrent severe without psychotic features: Principal | ICD-10-CM

## 2017-08-04 DIAGNOSIS — R45851 Suicidal ideations: Secondary | ICD-10-CM

## 2017-08-04 DIAGNOSIS — A530 Latent syphilis, unspecified as early or late: Secondary | ICD-10-CM

## 2017-08-04 DIAGNOSIS — R440 Auditory hallucinations: Secondary | ICD-10-CM

## 2017-08-04 DIAGNOSIS — B20 Human immunodeficiency virus [HIV] disease: Secondary | ICD-10-CM

## 2017-08-04 DIAGNOSIS — F1020 Alcohol dependence, uncomplicated: Secondary | ICD-10-CM

## 2017-08-04 MED ORDER — ROSUVASTATIN CALCIUM 10 MG PO TABS
10.0000 mg | ORAL_TABLET | Freq: Every day | ORAL | Status: DC
  Filled 2017-08-04: qty 1

## 2017-08-04 MED ORDER — ROSUVASTATIN CALCIUM 5 MG PO TABS
10.0000 mg | ORAL_TABLET | Freq: Every day | ORAL | Status: DC
  Administered 2017-08-05 – 2017-08-12 (×9): 10 mg via ORAL
  Filled 2017-08-04: qty 14
  Filled 2017-08-04 (×7): qty 1
  Filled 2017-08-04: qty 14
  Filled 2017-08-04 (×4): qty 1

## 2017-08-04 MED ORDER — ROSUVASTATIN CALCIUM 10 MG PO TABS: 10.0000 mg | ORAL_TABLET | Freq: Every day | ORAL | Status: DC

## 2017-08-04 MED ORDER — BUPROPION HCL ER (XL) 150 MG PO TB24
150.0000 mg | ORAL_TABLET | Freq: Every day | ORAL | Status: DC
  Administered 2017-08-05 – 2017-08-06 (×2): 150 mg via ORAL
  Filled 2017-08-04 (×3): qty 1

## 2017-08-04 MED ORDER — ARIPIPRAZOLE 2 MG PO TABS
2.0000 mg | ORAL_TABLET | Freq: Every day | ORAL | Status: DC
  Administered 2017-08-04 – 2017-08-06 (×3): 2 mg via ORAL
  Filled 2017-08-04 (×5): qty 1
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
## 2017-08-04 NOTE — H&P (Signed)
Psychiatric Admission Assessment Adult  Patient Identification: Arthur [name] MRN:  [id number] Date of Evaluation:  08/04/2017 Chief Complaint:  MDD,rec,sev Principal Diagnosis: Major depressive disorder, recurrent severe without psychotic features ([facility]) Diagnosis:   Patient Active Problem List   Diagnosis Date Noted  . Alcohol use disorder, severe, dependence ([place]) [F10.20] 08/03/2017  . Major depressive disorder, recurrent severe without psychotic features ([place]) [F33.2] 08/03/2017  . Alcohol abuse [F10.10] 12/21/2016  . Alcohol-induced mood disorder ([place]) [F10.94] 12/20/2016  . TIA (transient ischemic attack) [G45.9] 11/17/2016  . HTN (hypertension) [I10] 11/17/2016  . 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 Alcoholism ([place]) [F10.20] 06/20/2016  . Hypoglycemia [E16.2] 04/05/2016  . Diarrhea [R19.7] 11/25/2015  . Flank pain [R10.9] 11/25/2015  . Lower back pain [M54.5] 11/25/2015  . Loss of weight [R63.4] 11/17/2015  . 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 HIV disease ([place]) [B20] 11/12/2014  . 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 Elevated fasting blood sugar [R73.01] 05/15/2012  . H/O surgical procedure [Z98.890] 05/15/2012  . Lung mass [R91.8] 05/15/2012  . History of surgical procedure [Z98.890] 05/15/2012   History of Present Illness: Patient with history of Major depression, HIV, latentsyphilis, severe alcohol use disorder who was IVC'd by his counselor due to suicidal ideation.Patient reports that he has been getting increasingly depressed, hopeless, helpless and has stopped taking care of his personal hygiene. Patient reports recurrent suicidal thoughts and has been having command auditory hallucination. "I have been hearing voices  telling me to drink Clorox to get clean from HIV, Alcohol AND BEING gay.'' Patient reports that he has been drinking lots of alcohol on a daily basis to suppress depression but denies other illicit drug  use.  61 year old male transferred from [place] long ED for hearing voices telling him to drink Clorox in order to clean himself. Patient stated that he did not feel clean inside, gives history of previous suicidal ideation with planning but no attempt. Patient states the patient has been drinking excessively and feels that his contribution to his order tree hallucinations. Patient adds that sometimes he drinks in order to keep the voice is quiet. He reports that he's been diagnosed with bipolar disorder in the past, has HIV, latent syphilis and also has a history of TIAs Patient states that he did well in the past on Abilify, is no longer on it. He reports that he currently is on Cymbalta and Wellbutrin XL. He states that he needs something to help with his voices, help stabilize his mood. He acknowledges that he is overwhelmed because of the voices and was scared that he would act on what the voices were saying to him. Patient denies any homicidal ideation, any paranoia. In regards to his depression, patient states that his mood fluctuates on a scale of 0-10 with 0 being no symptoms in 10 being the worst between a 5 and a 7. Patient agrees to start Abilify 2 mg PO BID and decrease the Wellbutrin to 150 mg with continued taper down and discontinue. Patient states that he will be safe on the unit and participate in groups and interact with staff.    Associated Signs/Symptoms: Depression Symptoms:  depressed mood, suicidal thoughts with specific plan, anxiety, disturbed sleep, (Hypo) Manic Symptoms:  Hallucinations, Anxiety Symptoms:  Excessive Worry, Psychotic Symptoms:  Hallucinations: Auditory PTSD Symptoms: Denies Total Time spent with patient: 30 minutes  Past Psychiatric History: MDD, SUD,   Is the patient at risk to self? No.  Has the patient  been a risk to self in the past 6 months? Yes.    Has the patient been a risk to self within the distant past? Yes.    Is the patient a risk to  others? No.  Has the patient been a risk to others in the past 6 months? No.  Has the patient been a risk to others within the distant past? No.   Prior Inpatient Therapy:   Prior Outpatient Therapy:    Alcohol Screening: 1. How often do you have a drink containing alcohol?: 4 or more times a week 2. How many drinks containing alcohol do you have on a typical day when you are drinking?: 10 or more 3. How often do you have six or more drinks on one occasion?: Daily or almost daily Preliminary Score: 8 4. How often during the last year have you found that you were not able to stop drinking once you had started?: Daily or almost daily 5. How often during the last year have you failed to do what was normally expected from you becasue of drinking?: Daily or almost daily 6. How often during the last year have you needed a first drink in the morning to get yourself going after a heavy drinking session?: Daily or almost daily 7. How often during the last year have you had a feeling of guilt of remorse after drinking?: Daily or almost daily 8. How often during the last year have you been unable to remember what happened the night before because you had been drinking?: Less than monthly 9. Have you or someone else been injured as a result of your drinking?: Yes, during the last year 10. Has a relative or friend or a doctor or another health worker been concerned about your drinking or suggested you cut down?: Yes, during the last year Alcohol Use Disorder Identification Test Final Score (AUDIT): 37 Brief Intervention: Yes Substance Abuse History in the last 12 months:  Yes.   Consequences of Substance Abuse: Reviewed Previous Psychotropic Medications: Yes  Psychological Evaluations: Yes  Past Medical History:  Past Medical History:  Diagnosis Date  . Alcohol abuse 12/21/2016  . Alcoholism ([place]) 06/20/2016  . Bipolar disorder ([place])   . Cough 11/17/2015  . Depression   . Diarrhea 11/25/2015  .  Difficulty voiding 12/15/2015  . Dysuria 11/25/2015  . Fever, unspecified 11/17/2015  . Flank pain 11/25/2015  . History of blood transfusion 12-12-1998  . HIV infection ([place])   . HTN (hypertension) 11/17/2016  . 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 Late syphilis   . Lower back pain 11/25/2015  . Recurrent major depression-severe ([place]) 06/20/2016  . TIA (transient ischemic attack) 11/17/2016    Past Surgical History:  Procedure Laterality Date  . broken right leg Right 2000  . FRACTURE SURGERY    . GASTRIC BYPASS  2012   [facility]   Family History:  Family History  Problem Relation Age of Onset  . Heart disease Mother   . Stroke Mother   . Heart disease Father    Family Psychiatric  History: None reported Tobacco Screening: Have you used any form of tobacco in the last 30 days? (Cigarettes, Smokeless Tobacco, Cigars, and/or Pipes): No Social History:  History  Alcohol Use  . 50.4 oz/week  . 84 Cans of beer per week    Comment: every day use     History  Drug Use No    Additional Social History:  Allergies:  No Known Allergies Lab Results:  Results for orders placed or performed during the hospital encounter of 08/02/17 (from the past 48 hour(s))  Comprehensive metabolic panel     Status: Abnormal   Collection Time: 08/02/17  7:15 PM  Result Value Ref Range   Sodium 133 (L) 135 - 145 mmol/L   Potassium 3.8 3.5 - 5.1 mmol/L   Chloride 103 101 - 111 mmol/L   CO2 22 22 - 32 mmol/L   Glucose, Bld 92 65 - 99 mg/dL   BUN 9 6 - 20 mg/dL   Creatinine, Ser 1.07 0.61 - 1.24 mg/dL   Calcium 8.3 (L) 8.9 - 10.3 mg/dL   Total Protein 6.5 6.5 - 8.1 g/dL   Albumin 3.9 3.5 - 5.0 g/dL   AST 23 15 - 41 U/L   ALT 20 17 - 63 U/L   Alkaline Phosphatase 61 38 - 126 U/L   Total Bilirubin 0.5 0.3 - 1.2 mg/dL   GFR calc non Af Amer >60 >60 mL/min   GFR calc Af Amer >60 >60 mL/min    Comment: (NOTE) The eGFR has been calculated using the CKD EPI equation. This  calculation has not been validated in all clinical situations. eGFR's persistently <60 mL/min signify possible Chronic Kidney Disease.    Anion gap 8 5 - 15  cbc     Status: Abnormal   Collection Time: 08/02/17  7:15 PM  Result Value Ref Range   WBC 8.3 4.0 - 10.5 K/uL   RBC 3.93 (L) 4.22 - 5.81 MIL/uL   Hemoglobin 12.7 (L) 13.0 - 17.0 g/dL   HCT 37.2 (L) 39.0 - 52.0 %   MCV 94.7 78.0 - 100.0 fL   MCH 32.3 26.0 - 34.0 pg   MCHC 34.1 30.0 - 36.0 g/dL   RDW 14.6 11.5 - 15.5 %   Platelets 230 150 - 400 K/uL  Ethanol     Status: Abnormal   Collection Time: 08/02/17  7:16 PM  Result Value Ref Range   Alcohol, Ethyl (B) 185 (H) <5 mg/dL    Comment:        LOWEST DETECTABLE LIMIT FOR SERUM ALCOHOL IS 5 mg/dL FOR MEDICAL PURPOSES ONLY   Salicylate level     Status: None   Collection Time: 08/02/17  7:16 PM  Result Value Ref Range   Salicylate Lvl <0.2 2.8 - 30.0 mg/dL  Acetaminophen level     Status: Abnormal   Collection Time: 08/02/17  7:16 PM  Result Value Ref Range   Acetaminophen (Tylenol), Serum <10 (L) 10 - 30 ug/mL    Comment:        THERAPEUTIC CONCENTRATIONS VARY SIGNIFICANTLY. A RANGE OF 10-30 ug/mL MAY BE AN EFFECTIVE CONCENTRATION FOR MANY PATIENTS. HOWEVER, SOME ARE BEST TREATED AT CONCENTRATIONS OUTSIDE THIS RANGE. ACETAMINOPHEN CONCENTRATIONS >150 ug/mL AT 4 HOURS AFTER INGESTION AND >50 ug/mL AT 12 HOURS AFTER INGESTION ARE OFTEN ASSOCIATED WITH TOXIC REACTIONS.   Rapid urine drug screen (hospital performed)     Status: None   Collection Time: 08/02/17  7:46 PM  Result Value Ref Range   Opiates NONE DETECTED NONE DETECTED   Cocaine NONE DETECTED NONE DETECTED   Benzodiazepines NONE DETECTED NONE DETECTED   Amphetamines NONE DETECTED NONE DETECTED   Tetrahydrocannabinol NONE DETECTED NONE DETECTED   Barbiturates NONE DETECTED NONE DETECTED    Comment:        DRUG SCREEN FOR MEDICAL PURPOSES ONLY.  IF CONFIRMATION IS NEEDED FOR ANY PURPOSE,  NOTIFY  LAB WITHIN 5 DAYS.        LOWEST DETECTABLE LIMITS FOR URINE DRUG SCREEN Drug Class       Cutoff (ng/mL) Amphetamine      1000 Barbiturate      200 Benzodiazepine   371 Tricyclics       696 Opiates          300 Cocaine          300 THC              50   Urinalysis, Routine w reflex microscopic     Status: None   Collection Time: 08/03/17 12:16 PM  Result Value Ref Range   Color, Urine YELLOW YELLOW   APPearance CLEAR CLEAR   Specific Gravity, Urine 1.005 1.005 - 1.030   pH 6.0 5.0 - 8.0   Glucose, UA NEGATIVE NEGATIVE mg/dL   Hgb urine dipstick NEGATIVE NEGATIVE   Bilirubin Urine NEGATIVE NEGATIVE   Ketones, ur NEGATIVE NEGATIVE mg/dL   Protein, ur NEGATIVE NEGATIVE mg/dL   Nitrite NEGATIVE NEGATIVE   Leukocytes, UA NEGATIVE NEGATIVE    Blood Alcohol level:  Lab Results  Component Value Date   ETH 185 (H) 08/02/2017   ETH 143 (H) 78/93/8101    Metabolic Disorder Labs:  Lab Results  Component Value Date   HGBA1C 5.4 10/10/2016   No results found for: PROLACTIN Lab Results  Component Value Date   CHOL 120 (L) 05/25/2016   TRIG 52 05/25/2016   HDL 63 05/25/2016   CHOLHDL 1.9 05/25/2016   VLDL 10 05/25/2016   LDLCALC 47 05/25/2016   LDLCALC 62 10/19/2015    Current Medications: Current Facility-Administered Medications  Medication Dose Route Frequency Provider Last Rate Last Dose  . acetaminophen (TYLENOL) tablet 650 mg  650 mg Oral Q6H PRN Patrecia Pour, NP      . alum & mag hydroxide-simeth (MAALOX/MYLANTA) 200-200-20 MG/5ML suspension 30 mL  30 mL Oral Q6H PRN Patrecia Pour, NP      . ARIPiprazole (ABILIFY) tablet 2 mg  2 mg Oral Daily Rudi Knippenberg, Darnelle Maffucci B, FNP   2 mg at 08/04/17 0930  . aspirin chewable tablet 81 mg  81 mg Oral Daily Patrecia Pour, NP   81 mg at 08/04/17 7510  . [START ON 08/05/2017] buPROPion (WELLBUTRIN XL) 24 hr tablet 150 mg  150 mg Oral Daily Trinisha Paget B, FNP      . dolutegravir (TIVICAY) tablet 50 mg  50 mg Oral Daily Patrecia Pour, NP   50 mg at 08/04/17 0804  . DULoxetine (CYMBALTA) DR capsule 120 mg  120 mg Oral Daily Patrecia Pour, NP   120 mg at 08/04/17 0805  . emtricitabine-tenofovir AF (DESCOVY) 200-25 MG per tablet 1 tablet  1 tablet Oral Daily Patrecia Pour, NP   1 tablet at 08/04/17 0804  . hydrochlorothiazide (HYDRODIURIL) tablet 25 mg  25 mg Oral Daily Patrecia Pour, NP   25 mg at 08/04/17 2585  . hydrocortisone cream 1 %   Topical BID PRN Patrecia Pour, NP      . hydrOXYzine (ATARAX/VISTARIL) tablet 25 mg  25 mg Oral Q6H PRN Lindon Romp A, NP      . ibuprofen (ADVIL,MOTRIN) tablet 600 mg  600 mg Oral Q8H PRN Patrecia Pour, NP      . loperamide (IMODIUM) capsule 2-4 mg  2-4 mg Oral PRN Rozetta Nunnery, NP      .  LORazepam (ATIVAN) tablet 1 mg  1 mg Oral Q6H PRN Lindon Romp A, NP      . LORazepam (ATIVAN) tablet 1 mg  1 mg Oral QID Lindon Romp A, NP   1 mg at 08/04/17 1937   Followed by  . [START ON 08/05/2017] LORazepam (ATIVAN) tablet 1 mg  1 mg Oral TID Rozetta Nunnery, NP       Followed by  . [START ON 08/06/2017] LORazepam (ATIVAN) tablet 1 mg  1 mg Oral BID Rozetta Nunnery, NP       Followed by  . [START ON 08/08/2017] LORazepam (ATIVAN) tablet 1 mg  1 mg Oral Daily Lindon Romp A, NP      . magnesium hydroxide (MILK OF MAGNESIA) suspension 30 mL  30 mL Oral Daily PRN Patrecia Pour, NP      . multivitamin with minerals tablet 1 tablet  1 tablet Oral Daily Lindon Romp A, NP   1 tablet at 08/04/17 0804  . ondansetron (ZOFRAN) tablet 4 mg  4 mg Oral Q8H PRN Patrecia Pour, NP      . tamsulosin [facility]) capsule 0.4 mg  0.4 mg Oral BID Patrecia Pour, NP   0.4 mg at 08/04/17 0804  . thiamine (VITAMIN B-1) tablet 100 mg  100 mg Oral Daily Patrecia Pour, NP   100 mg at 08/04/17 9024  . traZODone (DESYREL) tablet 50 mg  50 mg Oral QHS,MR X 1 Lindon Romp A, NP   50 mg at 08/03/17 2240   PTA Medications: Prescriptions Prior to Admission  Medication Sig Dispense Refill Last Dose  .  aspirin 81 MG tablet Take 1 tablet (81 mg total) by mouth daily. (Patient not taking: Reported on 08/02/2017) 30 tablet 11 Not Taking at Unknown time  . buPROPion (WELLBUTRIN) 100 MG tablet Take 1 tablet by mouth 3 (three) times daily.  5 08/02/2017 at Unknown time  . DESCOVY 200-25 MG tablet TAKE 1 TABLET BY MOUTH DAILY 30 tablet 5 08/02/2017 at Unknown time  . dolutegravir (TIVICAY) 50 MG tablet Take 1 tablet (50 mg total) by mouth daily. 30 tablet 5 08/02/2017 at Unknown time  . DULoxetine (CYMBALTA) 60 MG capsule Take 2 capsules (120 mg total) by mouth daily. 60 capsule 3 08/02/2017 at Unknown time  . rosuvastatin (CRESTOR) 10 MG tablet Take 1 tablet (10 mg total) by mouth daily. 30 tablet 11 08/02/2017 at Unknown time  . sildenafil (VIAGRA) 100 MG tablet Take 1 tablet (100 mg total) by mouth daily as needed for erectile dysfunction. Come through patient assistance 10 tablet 5 unknown  . tamsulosin (FLOMAX) 0.4 MG CAPS capsule Take 1 capsule (0.4 mg total) by mouth 2 (two) times daily. 60 capsule 11 08/02/2017 at Unknown time    Musculoskeletal: Strength & Muscle Tone: within normal limits Gait & Station: normal Patient leans: N/A  Psychiatric Specialty Exam: Physical Exam  Nursing note and vitals reviewed. Constitutional: He is oriented to person, place, and time. He appears well-developed and well-nourished.  Cardiovascular: Normal rate.   Respiratory: Effort normal.  Musculoskeletal: Normal range of motion.  Neurological: He is alert and oriented to person, place, and time.  Skin: Skin is warm.    Review of Systems  Constitutional: Negative.   HENT: Negative.   Eyes: Negative.   Respiratory: Negative.   Cardiovascular: Negative.   Genitourinary: Negative.   Musculoskeletal: Negative.   Skin: Negative.   Neurological: Negative.   Endo/Heme/Allergies: Negative.  Blood pressure 112/67, pulse 72, temperature 97.9 F (36.6 C), temperature source Oral, resp. rate 18, height '5\' 9"'   (1.753 m), weight 82.1 kg (181 lb).Body mass index is 26.73 kg/m.  General Appearance: Casual  Eye Contact:  Good  Speech:  Clear and Coherent and Normal Rate  Volume:  Normal  Mood:  Depressed  Affect:  Flat  Thought Process:  Coherent and Descriptions of Associations: Intact  Orientation:  Full (Time, Place, and Person)  Thought Content:  WDL  Suicidal Thoughts:  Yes.  with intent/plan  Homicidal Thoughts:  No  Memory:  Immediate;   Good Recent;   Good  Judgement:  Good  Insight:  Good  Psychomotor Activity:  Normal  Concentration:  Concentration: Good  Recall:  Good  Fund of Knowledge:  Good  Language:  Good  Akathisia:  No  Handed:  Right  AIMS (if indicated):     Assets:  Financial Resources/Insurance Housing Social Support  ADL's:  Intact  Cognition:  WNL  Sleep:  Number of Hours: 5.5    Treatment Plan Summary: Daily contact with patient to assess and evaluate symptoms and progress in treatment, Medication management and Plan is to:  -Decrease Wellbutrin 150 mg PO Daily and taper to discontinue -Continue Cymbalta 120 mg PO Daily for mood stability -Start Abilify 2 mg PO BID for mood stability and AH -Continue Hydroxyzine 25 mg PO Q6H PRN for anxiety -Continue Ativan CIWA protocol -Continue Trazodone 50 mg PO QHS PRN for insomnia -Continue all other medications for medical conditions -Encourage group therapy participation  Observation Level/Precautions:  15 minute checks  Laboratory:  Reviewed  Psychotherapy:  Group therapy  Medications:  See MAR  Consultations:  As needed  Discharge Concerns:  Relapse  Estimated LOS: 3-5 days  Other:  Admit to 300 hall   Physician Treatment Plan for Primary Diagnosis: Major depressive disorder, recurrent severe without psychotic features ([place]) Long Term Goal(s): Improvement in symptoms so as ready for discharge  Short Term Goals: Ability to verbalize feelings will improve and Ability to disclose and discuss suicidal  ideas  Physician Treatment Plan for Secondary Diagnosis: Principal Problem:   Major depressive disorder, recurrent severe without psychotic features ([place]) Active Problems:   Alcohol use disorder, severe, dependence ([place])  Long Term Goal(s): Improvement in symptoms so as ready for discharge  Short Term Goals: Ability to maintain clinical measurements within normal limits will improve and Compliance with prescribed medications will improve  I certify that inpatient services furnished can reasonably be expected to improve the patient's condition.    Lewis Shock, FNP 8/24/20189:56 AM

## 2017-08-04 NOTE — [facility] Group Notes (Signed)
LCSW Group Therapy Note  08/04/2017 1:15pm  Type of Therapy and Topic:  Group Therapy:  Feelings around Relapse and Recovery  Participation Level: Active   Description of Group:    Patients in this group will discuss emotions they experience before and after a relapse. They will process how experiencing these feelings, or avoidance of experiencing them, relates to having a relapse. Facilitator will guide patients to explore emotions they have related to recovery. Patients will be encouraged to process which emotions are more powerful. They will be guided to discuss the emotional reaction significant others in their lives may have to their relapse or recovery. Patients will be assisted in exploring ways to respond to the emotions of others without this contributing to a relapse.  Therapeutic Goals: 1. Patient will identify two or more emotions that lead to a relapse for them 2. Patient will identify two emotions that result when they relapse 3. Patient will identify two emotions related to recovery 4. Patient will demonstrate ability to communicate their needs through discussion and/or role plays   Therapeutic Modalities:   Cognitive Behavioral Therapy Solution-Focused Therapy Assertiveness Training Relapse Prevention Therapy   Coyt Govoni B Jamile Sivils, MSW, LCSWA 08/04/2017 3:01 PM   

## 2017-08-04 NOTE — Plan of Care (Signed)
Problem: Medication: Goal: Compliance with prescribed medication regimen will improve Outcome: Progressing Pt compliant with medications denies adverse drug reactions when assessed.   Problem: Safety: Goal: Ability to remain free from injury will improve Outcome: Progressing Routine safety checks maintained on and off unit without self harm gestures to note at this time.

## 2017-08-04 NOTE — Progress Notes (Signed)
D: Pt A & O X4. Presents with depressed affect and mood. Pt is guarded but forwards on interactions. Per pt "I keep hearing the voices telling me to drink bleech"."I know it's not right and drinking bleech will not cure my HIV or help me with my depression either". Reports "just anxiety" when asked about withdrawal symptoms.  A: Scheduled medications administered as per MD's orders and effects monitored. Support and availability provided to pt. Writer encouraged pt to increase fluids related to low BP & pulse readings,  to voice concerns, attend unit groups and comply with current treatment regimen as ordered. Informed pt of medication changes. Q 15 minutes safety checks remains effective without self harm gestures or outburst to note at this time. R: Pt receptive to care. Pt in agreement with medication changes and has been medication compliant. Denies adverse drug reactions when assessed. Attended unit groups as scheduled. Off unit with peers for recreational activities, returned without issues. Tolerates all PO intake well. POC maintaintaned for safety and mood stability.

## 2017-08-04 NOTE — [facility] Suicide Risk Assessment (Signed)
[facility] Admission Suicide Risk Assessment   Nursing information obtained from:    Demographic factors:    Current Mental Status:    Loss Factors:    Historical Factors:    Risk Reduction Factors:     Total Time spent with patient: 1 hour Principal Problem: Major depressive disorder, recurrent severe without psychotic features (HCC) Diagnosis:   Patient Active Problem List   Diagnosis Date Noted  . Major depressive disorder, recurrent severe without psychotic features (HCC) [F33.2] 08/03/2017    Priority: High  . Alcohol use disorder, severe, dependence (HCC) [F10.20] 08/03/2017    Priority: Medium  . Alcohol abuse [F10.10] 12/21/2016  . Alcohol-induced mood disorder (HCC) [F10.94] 12/20/2016  . TIA (transient ischemic attack) [G45.9] 11/17/2016  . HTN (hypertension) [I10] 11/17/2016  . 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 Alcoholism (HCC) [F10.20] 06/20/2016  . Hypoglycemia [E16.2] 04/05/2016  . Diarrhea [R19.7] 11/25/2015  . Flank pain [R10.9] 11/25/2015  . Lower back pain [M54.5] 11/25/2015  . Loss of weight [R63.4] 11/17/2015  . 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 HIV disease (HCC) [B20] 11/12/2014  . 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 Elevated fasting blood sugar [R73.01] 05/15/2012  . H/O surgical procedure [Z98.890] 05/15/2012  . Lung mass [R91.8] 05/15/2012  . History of surgical procedure [Z98.890] 05/15/2012   Subjective Data: Patient is a 61 year old male transferred from [place] long ED for hearing voices telling him to drink Clorox in order to clean himself. Patient stated that he did not feel clean inside, gives history of previous suicidal ideation with planning but no attempt. Patient states the patient has been drinking excessively and feels that his contribution to his order tree hallucinations. Patient adds that sometimes he drinks in order to keep the voice is quiet. He reports that he's been diagnosed with  bipolar disorder in the past, has HIV, latent syphilis and also has a history of TIAs  Patient states that he did well in the past on Abilify, is no longer on it. He reports that he currently is on Cymbalta and Wellbutrin XL. He states that he needs something to help with his voices, help stabilize his mood. He acknowledges that he is overwhelmed because of the voices and was scared that he would act on what the voices were saying to him. Patient denies any homicidal ideation, any paranoia. In regards to his depression, patient states that his mood fluctuates on a scale of 0-10 with 0 being no symptoms in 10 being the worst between a 5and a 7  Continued Clinical Symptoms:  Alcohol Use Disorder Identification Test Final Score (AUDIT): 37 The "Alcohol Use Disorders Identification Test", Guidelines for Use in Primary Care, Second Edition.  World Science writer [facility]). Score between 0-7:  no or low risk or alcohol related problems. Score between 8-15:  moderate risk of alcohol related problems. Score between 16-19:  high risk of alcohol related problems. Score 20 or above:  warrants further diagnostic evaluation for alcohol dependence and treatment.   CLINICAL FACTORS:   Severe Anxiety and/or Agitation Bipolar Disorder:   Depressive phase Alcohol/Substance Abuse/Dependencies   Musculoskeletal: Strength & Muscle Tone: within normal limits Gait & Station: normal Patient leans: N/A  Psychiatric Specialty Exam: Physical Exam  Review of Systems  Constitutional: Positive for malaise/fatigue. Negative for fever.  HENT: Negative for congestion and sore throat.   Eyes: Negative.  Negative for blurred vision, double vision, discharge and redness.  Respiratory: Negative.  Negative for cough,  shortness of breath and wheezing.   Cardiovascular: Negative for chest pain and palpitations.  Gastrointestinal: Negative for abdominal pain, heartburn, nausea and vomiting.  Neurological: Negative.   Negative for dizziness, seizures, loss of consciousness, weakness and headaches.  Endo/Heme/Allergies: Negative.  Negative for environmental allergies.  Psychiatric/Behavioral: Positive for depression, hallucinations and suicidal ideas. Negative for substance abuse. The patient is nervous/anxious and has insomnia.     Blood pressure (!) 144/93, pulse 65, temperature 98.8 F (37.1 C), temperature source Oral, resp. rate 18, height 5\' 9"  (1.753 m), weight 82.1 kg (181 lb).Body mass index is 26.73 kg/m.  General Appearance: Disheveled  Eye Contact:  Fair  Speech:  Clear and Coherent and Normal Rate  Volume:  Normal  Mood:  Depressed, Dysphoric, Hopeless, Irritable and Worthless  Affect:  Congruent and Depressed  Thought Process:  Coherent, Linear and Descriptions of Associations: Intact  Orientation:  Full (Time, Place, and Person)  Thought Content:  Hallucinations: Command:  Commanding him to drink bleach in order to kill himself  Suicidal Thoughts:  Yes.  with intent/plan  Homicidal Thoughts:  No  Memory:  Immediate;   Fair Recent;   Fair Remote;   Fair  Judgement:  Impaired  Insight:  Shallow  Psychomotor Activity:  Normal  Concentration:  Concentration: Fair and Attention Span: Fair  Recall:  [employer] of Knowledge:  Fair  Language:  Fair  Akathisia:  No  Handed:  Right  AIMS (if indicated):     Assets:  Desire for Improvement Housing  ADL's:  Impaired  Cognition:  WNL  Sleep:  Number of Hours: 5.5      COGNITIVE FEATURES THAT CONTRIBUTE TO RISK:  Loss of executive function    SUICIDE RISK:   Severe:  Frequent, intense, and enduring suicidal ideation, specific plan, no subjective intent, but some objective markers of intent (i.e., choice of lethal method), the method is accessible, some limited preparatory behavior, evidence of impaired self-control, severe dysphoria/symptomatology, multiple risk factors present, and few if any protective factors, particularly a lack of  social support.  PLAN OF CARE: Discussed with patient to continue patient on Cymbalta, taper off his Wellbutrin XL and to start Abilify 2 mg twice a day to help with her psychosis and also help stabilize his mood Patient to work on his coping skills, communication skills, identified triggers, and work on crisis and Water engineer. Patient while here to participate in therapeutic milieu and attend groups. Patient contracts for safety on the unit  I certify that inpatient services furnished can reasonably be expected to improve the patient's condition.   Nelly Rout, MD 08/04/2017, 8:32 AM

## 2017-08-04 NOTE — Progress Notes (Signed)
Patient ID: Arthur [name], male   DOB: 1956-04-02, 61 y.o.   MRN: [id number]  Pt currently presents with a flat affect and anxious behavior. Pt reports to Clinical research associate that his main concern are the "voices that tell me to drink bleach." Pt reports that at home these voices also told him to "take a wooden drawer and hit myself in the head with it." Pt reports good sleep with current medication regimen, states "I didn't have any nightmares!" Expresses knowledge of medications and their indications.   Pt provided with medications per providers orders. Pt's labs and vitals were monitored throughout the night. Pt given a 1:1 about emotional and mental status. Pt supported and encouraged to express concerns and questions. Pt educated on medications.  Pt's safety ensured with 15 minute and environmental checks. Pt currently denies SI/HI and visual hallucinations. Endorses on going command hallucinations. Pt verbally agrees to seek staff if SI/HI or VH occurs, AH worsens and to consult with staff before acting on any harmful thoughts. Will continue POC.

## 2017-08-05 MED ORDER — HYDROCORTISONE 1 % EX CREA
TOPICAL_CREAM | Freq: Two times a day (BID) | CUTANEOUS | Status: DC
  Administered 2017-08-05 – 2017-08-10 (×7): via TOPICAL
  Administered 2017-08-11 (×2): 1 via TOPICAL
  Administered 2017-08-12 – 2017-08-13 (×2): via TOPICAL
  Filled 2017-08-05: qty 28

## 2017-08-05 NOTE — Progress Notes (Signed)
D.  Pt pleasant on approach, complaint of anxiety but no other complaints voiced.  Pt was positive for evening wrap up group, observed engaged in appropriate interaction with peers on the unit.  Pt denies SI/HI but is continuing to experience some verbal hallucinations.  A. Support and encouragement offered, medication given as ordered  R.  Pt remains safe on the unit, will continue to monitor.

## 2017-08-05 NOTE — [facility] Group Notes (Signed)
[facility] LCSW Group Therapy Note  08/05/2017 @ 10:15 to 11:15 AM  Type of Therapy and Topic:  Group Therapy: Avoiding Self-Sabotaging and Enabling Behaviors  Participation Level:  Minimal   Description of Group The main focus of today's process group to discuss what "self-sabotage" means and use motivational interviewing to discuss what benefits, negative or positive, were involved in a self-identified self-sabotaging behavior. We then talked about reasons the patient may want to change the behavior and their current desire to change.   Summary of Patient Progress: Patient was basically quiet unless asked direct questions. He identified with isolation and over thinking as self sabotaging behaviors.   Therapeutic molalities: Cognitive Behavioral Therapy Person-Centered Therapy Motivational Interviewing  Therapeutic Goals: 1. Patients will demonstrate understanding of the concept of self sabotage 2. Patients will be able to identify pros and cons of their behaviors 3. Patients will be able to identify at least two motivating factors for l of their desire for change   Carney Bern, LCSW

## 2017-08-05 NOTE — Progress Notes (Signed)
[facility] MD Progress Note  08/05/2017 2:50 PM Arthur [name]  MRN:  [id number] Subjective:    Per nursing:Pt currently presents with a flat affect and anxious behavior. Pt reports to Clinical research associate that his main concern are the "voices that tell me to drink bleach." Pt reports that at home these voices also told him to "take a wooden drawer and hit myself in the head with it." Pt reports good sleep with current medication regimen, states "I didn't have any nightmares!" Expresses knowledge of medications and their indications.   Pt provided with medications per providers orders. Pt's labs and vitals were monitored throughout the night. Pt given a 1:1 about emotional and mental status. Pt supported and encouraged to express concerns and questions. Pt educated on medications.  Objective:Arthur [name] is a 61 year old male pt admitted on involuntary basis after presenting to the emergency department complaining of voices that are telling him to drink bleach so he can cleanse himself. He reports resting well last night. He also notes a decrease in his withdrawal symtpoms, detox treatment is going fairly well. Upon discharge he is hoping to get into [facility]. 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 Discussed with patient the different medications that he is taking, as well as the difference in typical vs atypical anti-psychotic medication. He is advised that he is taking Abilify which can be tolerated well and that he needs to continue this medication at [facility] stime. Denies any SI/HI/AVH.    Principal Problem: Major depressive disorder, recurrent severe without psychotic features (HCC) Diagnosis:   Patient Active Problem List   Diagnosis Date Noted  . Alcohol use disorder, severe, dependence (HCC) [F10.20] 08/03/2017  . Major depressive disorder, recurrent severe without psychotic features (HCC) [F33.2] 08/03/2017  . Alcohol abuse [F10.10]  12/21/2016  . Alcohol-induced mood disorder (HCC) [F10.94] 12/20/2016  . TIA (transient ischemic attack) [G45.9] 11/17/2016  . HTN (hypertension) [I10] 11/17/2016  . 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 Alcoholism (HCC) [F10.20] 06/20/2016  . Hypoglycemia [E16.2] 04/05/2016  . Diarrhea [R19.7] 11/25/2015  . Flank pain [R10.9] 11/25/2015  . Lower back pain [M54.5] 11/25/2015  . Loss of weight [R63.4] 11/17/2015  . 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 HIV disease (HCC) [B20] 11/12/2014  . 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 Elevated fasting blood sugar [R73.01] 05/15/2012  . H/O surgical procedure [Z98.890] 05/15/2012  . Lung mass [R91.8] 05/15/2012  . History of surgical procedure [Z98.890] 05/15/2012   Total Time spent with patient: 15 minutes  Past Psychiatric History: MDD, SUD  Past Medical History:  Past Medical History:  Diagnosis Date  . Alcohol abuse 12/21/2016  . Alcoholism (HCC) 06/20/2016  . Bipolar disorder (HCC)   . Cough 11/17/2015  . Depression   . Diarrhea 11/25/2015  . Difficulty voiding 12/15/2015  . Dysuria 11/25/2015  . Fever, unspecified 11/17/2015  . Flank pain 11/25/2015  . History of blood transfusion 12-12-1998  . HIV infection (HCC)   . HTN (hypertension) 11/17/2016  . 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 Late syphilis   . Lower back pain 11/25/2015  . 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 TIA (transient ischemic attack) 11/17/2016    Past Surgical History:  Procedure Laterality Date  . broken right leg Right 2000  . FRACTURE SURGERY    . GASTRIC BYPASS  2012   [facility]   Family  History:  Family History  Problem Relation Age of Onset  . Heart disease Mother   . Stroke Mother   . Heart disease Father    Family Psychiatric  History: None Social History:  History  Alcohol Use  . 50.4 oz/week  . 84 Cans of beer per week    Comment: every day use      History  Drug Use No    Social History   Social History  . Marital status: Single    Spouse name: N/A  . Number of children: N/A  . Years of education: N/A   Social History Main Topics  . Smoking status: Never Smoker  . Smokeless tobacco: Never Used  . Alcohol use 50.4 oz/week    84 Cans of beer per week     Comment: every day use  . Drug use: No  . Sexual activity: Not Currently    Partners: Male   Other Topics Concern  . None   Social History Narrative  . None   Additional Social History:                         Sleep: Fair  Appetite:  Good  Current Medications: Current Facility-Administered Medications  Medication Dose Route Frequency Provider Last Rate Last Dose  . acetaminophen (TYLENOL) tablet 650 mg  650 mg Oral Q6H PRN Charm Rings, NP      . alum & mag hydroxide-simeth (MAALOX/MYLANTA) 200-200-20 MG/5ML suspension 30 mL  30 mL Oral Q6H PRN Charm Rings, NP      . ARIPiprazole (ABILIFY) tablet 2 mg  2 mg Oral Daily Money, Gerlene Burdock, FNP   2 mg at 08/05/17 1121  . aspirin chewable tablet 81 mg  81 mg Oral Daily Charm Rings, NP   81 mg at 08/05/17 1122  . buPROPion (WELLBUTRIN XL) 24 hr tablet 150 mg  150 mg Oral Daily Money, Travis B, FNP   150 mg at 08/05/17 1120  . dolutegravir (TIVICAY) tablet 50 mg  50 mg Oral Daily Charm Rings, NP   50 mg at 08/05/17 1121  . DULoxetine (CYMBALTA) DR capsule 120 mg  120 mg Oral Daily Charm Rings, NP   120 mg at 08/05/17 1120  . emtricitabine-tenofovir AF (DESCOVY) 200-25 MG per tablet 1 tablet  1 tablet Oral Daily Charm Rings, NP   1 tablet at 08/05/17 1121  . hydrochlorothiazide (HYDRODIURIL) tablet 25 mg  25 mg Oral Daily Charm Rings, NP   25 mg at 08/05/17 1120  . hydrocortisone cream 1 %   Topical BID PRN Charm Rings, NP      . hydrocortisone cream 1 %   Topical BID Nira Conn A, NP      . hydrOXYzine (ATARAX/VISTARIL) tablet 25 mg  25 mg Oral Q6H PRN Nira Conn A, NP      .  ibuprofen (ADVIL,MOTRIN) tablet 600 mg  600 mg Oral Q8H PRN Charm Rings, NP      . loperamide (IMODIUM) capsule 2-4 mg  2-4 mg Oral PRN Nira Conn A, NP      . LORazepam (ATIVAN) tablet 1 mg  1 mg Oral Q6H PRN Nira Conn A, NP      . LORazepam (ATIVAN) tablet 1 mg  1 mg Oral TID Nira Conn A, NP   1 mg at 08/05/17 1302   Followed by  . [START ON 08/06/2017] LORazepam (ATIVAN) tablet 1 mg  1 mg Oral BID Jackelyn Poling, NP       Followed by  . [START ON 08/08/2017] LORazepam (ATIVAN) tablet 1 mg  1 mg Oral Daily Nira Conn A, NP      . magnesium hydroxide (MILK OF MAGNESIA) suspension 30 mL  30 mL Oral Daily PRN Charm Rings, NP      . multivitamin with minerals tablet 1 tablet  1 tablet Oral Daily Nira Conn A, NP   1 tablet at 08/05/17 1121  . ondansetron (ZOFRAN) tablet 4 mg  4 mg Oral Q8H PRN Charm Rings, NP      . rosuvastatin (CRESTOR) tablet 10 mg  10 mg Oral QHS Nira Conn A, NP   10 mg at 08/05/17 0013  . tamsulosin (FLOMAX) capsule 0.4 mg  0.4 mg Oral BID Charm Rings, NP   0.4 mg at 08/05/17 1122  . thiamine (VITAMIN B-1) tablet 100 mg  100 mg Oral Daily Charm Rings, NP   100 mg at 08/05/17 1120  . traZODone (DESYREL) tablet 50 mg  50 mg Oral QHS,MR X 1 Nira Conn A, NP   50 mg at 08/04/17 2152    Lab Results: No results found for this or any previous visit (from the past 48 hour(s)).  Blood Alcohol level:  Lab Results  Component Value Date   ETH 185 (H) 08/02/2017   ETH 143 (H) 12/19/2016    Metabolic Disorder Labs: Lab Results  Component Value Date   HGBA1C 5.4 10/10/2016   No results found for: PROLACTIN Lab Results  Component Value Date   CHOL 120 (L) 05/25/2016   TRIG 52 05/25/2016   HDL 63 05/25/2016   CHOLHDL 1.9 05/25/2016   VLDL 10 05/25/2016   LDLCALC 47 05/25/2016   LDLCALC 62 10/19/2015    Physical Findings: AIMS: Facial and Oral Movements Muscles of Facial Expression: None, normal Lips and Perioral Area: None,  normal Jaw: None, normal Tongue: None, normal,Extremity Movements Upper (arms, wrists, hands, fingers): None, normal Lower (legs, knees, ankles, toes): None, normal, Trunk Movements Neck, shoulders, hips: None, normal, Overall Severity Severity of abnormal movements (highest score from questions above): None, normal Incapacitation due to abnormal movements: None, normal Patient's awareness of abnormal movements (rate only patient's report): No Awareness, Dental Status Current problems with teeth and/or dentures?: No Does patient usually wear dentures?: No  CIWA:  CIWA-Ar Total: 2 COWS:     Musculoskeletal: Strength & Muscle Tone: within normal limits Gait & Station: normal Patient leans: N/A  Psychiatric Specialty Exam: Physical Exam  ROS  Blood pressure 99/65, pulse 78, temperature 97.9 F (36.6 C), temperature source Oral, resp. rate 16, height 5\' 9"  (1.753 m), weight 82.1 kg (181 lb).Body mass index is 26.73 kg/m.  General Appearance: Fairly Groomed  Eye Contact:  Fair  Speech:  Clear and Coherent and Normal Rate  Volume:  Normal  Mood:  Depressed  Affect:  Depressed  Thought Process:  Linear  Orientation:  Full (Time, Place, and Person)  Thought Content:  Hallucinations: Auditory  Suicidal Thoughts:  No  Homicidal Thoughts:  No  Memory:  Immediate;   Fair Recent;   Fair  Judgement:  Impaired  Insight:  Lacking  Psychomotor Activity:  Normal  Concentration:  Concentration: Fair and Attention Span: Fair  Recall:  [employer] of Knowledge:  Fair  Language:  Fair  Akathisia:  No  Handed:  Right  AIMS (if indicated):     Assets:  Communication  Skills Desire for Improvement Financial Resources/Insurance Leisure Time Physical Health Social Support Vocational/Educational  ADL's:  Intact  Cognition:  WNL  Sleep:  Number of Hours: 6.75    Treatment Plan Summary: Daily contact with patient to assess and evaluate symptoms and progress in treatment and Medication  management Daily contact with patient to assess and evaluate symptoms and progress in treatment, Medication management and Plan is to:  -Decrease Wellbutrin 150 mg PO Daily and taper to discontinue -Continue Cymbalta 120 mg PO Daily for mood stability -Start Abilify 2 mg PO BID for mood stability and AH -Continue Hydroxyzine 25 mg PO Q6H PRN for anxiety -Continue Ativan CIWA protocol -Continue Trazodone 50 mg PO QHS PRN for insomnia -Continue all other medications for medical conditions -Encourage group therapy participation Truman Hayward, FNP 08/05/2017, 2:50 PM

## 2017-08-05 NOTE — Progress Notes (Signed)
Patient reports increased auditory hallucinations causing him a great deal of distress.  Patient has been isolative to his room for the majority of the shift.  Patient denies HI but reports some suicidal thoughts.   Assess patient for safety, offer medications as prescribed engage patient in 1:1 staff talks.   Patient able to contract for safety, Continue to monitor as prescribed.  

## 2017-08-06 DIAGNOSIS — F419 Anxiety disorder, unspecified: Secondary | ICD-10-CM

## 2017-08-06 DIAGNOSIS — F39 Unspecified mood [affective] disorder: Secondary | ICD-10-CM

## 2017-08-06 LAB — GLUCOSE, CAPILLARY: GLUCOSE-CAPILLARY: 100 mg/dL — AB (ref 65–99)

## 2017-08-06 MED ORDER — ARIPIPRAZOLE 5 MG PO TABS
5.0000 mg | ORAL_TABLET | Freq: Every day | ORAL | Status: DC
  Administered 2017-08-06 – 2017-08-07 (×2): 5 mg via ORAL
  Filled 2017-08-06 (×4): qty 1

## 2017-08-06 MED ORDER — DOCUSATE SODIUM 100 MG PO CAPS
100.0000 mg | ORAL_CAPSULE | Freq: Every day | ORAL | Status: DC
  Administered 2017-08-07 – 2017-08-13 (×7): 100 mg via ORAL
  Filled 2017-08-06: qty 7
  Filled 2017-08-06 (×3): qty 1
  Filled 2017-08-06: qty 7
  Filled 2017-08-06 (×6): qty 1

## 2017-08-06 NOTE — [facility] Group Notes (Signed)
[facility] Group Notes:  (Nursing/MHT/Case Management/Adjunct)  Date:  08/06/2017  Time:  1430  Type of Therapy:  Nurse Education - Healthy Support Systems  Participation Level:  Did Not Attend  Participation Quality:    Affect:    Cognitive:    Insight:    Engagement in Group:    Modes of Intervention:    Summary of Progress/Problems: Patient invited however elected not to attend.  Lawrence Marseilles 08/06/2017, 3:37 PM

## 2017-08-06 NOTE — [facility] Group Notes (Signed)
[facility] LCSW Group Therapy Note   08/06/2017  10:15 - 11:15 AM   Type of Therapy and Topic: Group Therapy: Feelings Around Returning Home & Establishing a Framework Self Care   Participation Level:  Minimal   Description of Group:  Patients first processed thoughts and feelings about up coming discharge. These included fears of upcoming changes, lack of change, new living environments, judgements and expectations from others and overall stigma of MH issues. We discussed where to find supports and what self care may look and feel like.  Therapeutic Goals Addressed in Processing Group:  1. Patient will identify one healthy supportive that they can use at discharge. 2. Patient will identify one factor of a supportive framework and how to tell it from an unhealthy network. 3. Patient able to identify one coping skill to use when they do not have positive supports from others. 4. Patient will demonstrate ability to communicate their needs through discussion and/or role plays.  Summary of Patient Progress:  Pt joined goup past the midpoint and engaged minimally during group session. As patients processed their anxiety about discharge and described healthy supports patient shared no concerns. Patient was visibly more attentive during portion on self care and responsive to some of the exercise prompts.    Arthur Bern, LCSW

## 2017-08-06 NOTE — Progress Notes (Signed)
Patient ID: Arthur [name], male   DOB: 1956/02/08, 61 y.o.   MRN: [id number]  DAR: Patient reports this morning, during medication administration, he is experiencing SI and auditory hallucinations. He reports he is able to contract for safety at this time. Patient encouraged to stay in the milieu, however patient reports that he tends to isolate himself at times. He denies HI and visual hallucinations at this time. He reports sleep is poor, appetite is poor, energy level is low, and concentration is poor. He rates depression 8/10, hopelessness 8/10, and anxiety 7/10. He reports that nausea, agitation, and irritability are the current withdrawal symptoms he reports on his daily inventory sheet. He continues on the Ativan protocol.   He came to Clinical research associate after medication administration this morning and reported he felt like ingesting soap. He handed Clinical research associate a bar of soap and a bottle of liquid soap wrapped in a washcloth. He stated, "I will not touch my roommate's soap and I do not want to ingest deodorant." He denies wanting to ingest any other substance that he has in his vicinity at this time either. He reports that he has been having feelings of ingesting cleaning products "since 2015 when I was diagnosed with HIV." He reports, "logically I know it won't help but I feel that way anyway." He reports that he knows it will not get rid of his HIV but reports he thinks of being cleansed. Writer notified NP Henri Medal. Patient was moved closer to the nurses station in order to keep a closer watch on patient. Q15 minute safety checks are maintained as well. He is seen in the milieu intermittently and MHT Jannifer Hick was notified as well.

## 2017-08-06 NOTE — Progress Notes (Signed)
[facility] MD Progress Note  08/06/2017 3:09 PM Arthur [name]  MRN:  [id number]   Subjective:  Patient states that his voices are bothering him a lot this morning. He reports that they are giving him suicidal thoughts and he doesn't want to hurt himself. He is requesting a change to his medications to help get rid of the voices.  Objective: Patient is lying in bed and becomes tearful when talking about the voices that are telling him things to do. Will stop the Wellbutrin and Increase the Abilify to 5 mg PO Daily. It was reported to me from the RN that the patient brought his liquid and bar soap to the nurses station and said that his AH was telling him to ingest them. Don't think patient needs 1:1 since he contracts for safety and has logical thinking about the substances that he thought about ingesting.  Principal Problem: Major depressive disorder, recurrent severe without psychotic features (HCC) Diagnosis:   Patient Active Problem List   Diagnosis Date Noted  . Alcohol use disorder, severe, dependence (HCC) [F10.20] 08/03/2017  . Major depressive disorder, recurrent severe without psychotic features (HCC) [F33.2] 08/03/2017  . Alcohol abuse [F10.10] 12/21/2016  . Alcohol-induced mood disorder (HCC) [F10.94] 12/20/2016  . TIA (transient ischemic attack) [G45.9] 11/17/2016  . HTN (hypertension) [I10] 11/17/2016  . 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 Alcoholism (HCC) [F10.20] 06/20/2016  . Hypoglycemia [E16.2] 04/05/2016  . Diarrhea [R19.7] 11/25/2015  . Flank pain [R10.9] 11/25/2015  . Lower back pain [M54.5] 11/25/2015  . Loss of weight [R63.4] 11/17/2015  . 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 HIV disease (HCC) [B20] 11/12/2014  . 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 Elevated fasting blood sugar [R73.01] 05/15/2012  . H/O surgical procedure [Z98.890] 05/15/2012  . Lung mass [R91.8] 05/15/2012  . History of surgical procedure [Z98.890]  05/15/2012   Total Time spent with patient: 25 minutes  Past Psychiatric History: See H&P  Past Medical History:  Past Medical History:  Diagnosis Date  . Alcohol abuse 12/21/2016  . Alcoholism (HCC) 06/20/2016  . Bipolar disorder (HCC)   . Cough 11/17/2015  . Depression   . Diarrhea 11/25/2015  . Difficulty voiding 12/15/2015  . Dysuria 11/25/2015  . Fever, unspecified 11/17/2015  . Flank pain 11/25/2015  . History of blood transfusion 12-12-1998  . HIV infection (HCC)   . HTN (hypertension) 11/17/2016  . 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 Late syphilis   . Lower back pain 11/25/2015  . 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 TIA (transient ischemic attack) 11/17/2016    Past Surgical History:  Procedure Laterality Date  . broken right leg Right 2000  . FRACTURE SURGERY    . GASTRIC BYPASS  2012   [facility]   Family History:  Family History  Problem Relation Age of Onset  . Heart disease Mother   . Stroke Mother   . Heart disease Father    Family Psychiatric  History: See H&P Social History:  History  Alcohol Use  . 50.4 oz/week  . 84 Cans of beer per week    Comment: every day use     History  Drug Use No    Social History   Social History  . Marital status: Single    Spouse name: N/A  . Number of children: N/A  . Years of education: N/A   Social History Main Topics  . Smoking status: Never Smoker  . Smokeless tobacco:  Never Used  . Alcohol use 50.4 oz/week    84 Cans of beer per week     Comment: every day use  . Drug use: No  . Sexual activity: Not Currently    Partners: Male   Other Topics Concern  . None   Social History Narrative  . None   Additional Social History:    Sleep: Good  Appetite:  Good  Current Medications: Current Facility-Administered Medications  Medication Dose Route Frequency Provider Last Rate Last Dose  . acetaminophen (TYLENOL) tablet 650 mg  650 mg Oral Q6H PRN Charm Rings,  NP      . alum & mag hydroxide-simeth (MAALOX/MYLANTA) 200-200-20 MG/5ML suspension 30 mL  30 mL Oral Q6H PRN Charm Rings, NP      . ARIPiprazole (ABILIFY) tablet 5 mg  5 mg Oral Daily Jahari Wiginton, Gerlene Burdock, FNP   5 mg at 08/06/17 1154  . aspirin chewable tablet 81 mg  81 mg Oral Daily Charm Rings, NP   81 mg at 08/06/17 0851  . dolutegravir (TIVICAY) tablet 50 mg  50 mg Oral Daily Charm Rings, NP   50 mg at 08/06/17 0851  . DULoxetine (CYMBALTA) DR capsule 120 mg  120 mg Oral Daily Charm Rings, NP   120 mg at 08/06/17 0851  . emtricitabine-tenofovir AF (DESCOVY) 200-25 MG per tablet 1 tablet  1 tablet Oral Daily Charm Rings, NP   1 tablet at 08/06/17 [phone]  . hydrochlorothiazide (HYDRODIURIL) tablet 25 mg  25 mg Oral Daily Charm Rings, NP   25 mg at 08/06/17 0851  . hydrocortisone cream 1 %   Topical BID PRN Charm Rings, NP      . hydrocortisone cream 1 %   Topical BID Nira Conn A, NP      . hydrOXYzine (ATARAX/VISTARIL) tablet 25 mg  25 mg Oral Q6H PRN Nira Conn A, NP      . ibuprofen (ADVIL,MOTRIN) tablet 600 mg  600 mg Oral Q8H PRN Charm Rings, NP      . loperamide (IMODIUM) capsule 2-4 mg  2-4 mg Oral PRN Nira Conn A, NP      . LORazepam (ATIVAN) tablet 1 mg  1 mg Oral Q6H PRN Nira Conn A, NP   1 mg at 08/05/17 2117  . LORazepam (ATIVAN) tablet 1 mg  1 mg Oral BID Jackelyn Poling, NP       Followed by  . [START ON 08/08/2017] LORazepam (ATIVAN) tablet 1 mg  1 mg Oral Daily Nira Conn A, NP      . magnesium hydroxide (MILK OF MAGNESIA) suspension 30 mL  30 mL Oral Daily PRN Charm Rings, NP      . multivitamin with minerals tablet 1 tablet  1 tablet Oral Daily Nira Conn A, NP   1 tablet at 08/06/17 0851  . ondansetron (ZOFRAN) tablet 4 mg  4 mg Oral Q8H PRN Charm Rings, NP      . rosuvastatin (CRESTOR) tablet 10 mg  10 mg Oral QHS Nira Conn A, NP   10 mg at 08/05/17 2116  . tamsulosin (FLOMAX) capsule 0.4 mg  0.4 mg Oral BID Charm Rings,  NP   0.4 mg at 08/06/17 0851  . thiamine (VITAMIN B-1) tablet 100 mg  100 mg Oral Daily Charm Rings, NP   100 mg at 08/06/17 0851  . traZODone (DESYREL) tablet 50 mg  50 mg Oral QHS,MR  X 1 Nira Conn A, NP   50 mg at 08/05/17 2116    Lab Results: No results found for this or any previous visit (from the past 48 hour(s)).  Blood Alcohol level:  Lab Results  Component Value Date   ETH 185 (H) 08/02/2017   ETH 143 (H) 12/19/2016    Metabolic Disorder Labs: Lab Results  Component Value Date   HGBA1C 5.4 10/10/2016   No results found for: PROLACTIN Lab Results  Component Value Date   CHOL 120 (L) 05/25/2016   TRIG 52 05/25/2016   HDL 63 05/25/2016   CHOLHDL 1.9 05/25/2016   VLDL 10 05/25/2016   LDLCALC 47 05/25/2016   LDLCALC 62 10/19/2015    Physical Findings: AIMS: Facial and Oral Movements Muscles of Facial Expression: None, normal Lips and Perioral Area: None, normal Jaw: None, normal Tongue: None, normal,Extremity Movements Upper (arms, wrists, hands, fingers): None, normal Lower (legs, knees, ankles, toes): None, normal, Trunk Movements Neck, shoulders, hips: None, normal, Overall Severity Severity of abnormal movements (highest score from questions above): None, normal Incapacitation due to abnormal movements: None, normal Patient's awareness of abnormal movements (rate only patient's report): No Awareness, Dental Status Current problems with teeth and/or dentures?: No Does patient usually wear dentures?: No  CIWA:  CIWA-Ar Total: 6 COWS:     Musculoskeletal: Strength & Muscle Tone: within normal limits Gait & Station: normal Patient leans: N/A  Psychiatric Specialty Exam: Physical Exam  Nursing note and vitals reviewed. Constitutional: He is oriented to person, place, and time. He appears well-developed and well-nourished.  Cardiovascular: Normal rate.   Respiratory: Effort normal.  Musculoskeletal: Normal range of motion.  Neurological: He is  alert and oriented to person, place, and time.  Skin: Skin is warm.    Review of Systems  Constitutional: Negative.   HENT: Negative.   Eyes: Negative.   Respiratory: Negative.   Cardiovascular: Negative.   Gastrointestinal: Negative.   Genitourinary: Negative.   Musculoskeletal: Negative.   Skin: Negative.   Neurological: Negative.   Endo/Heme/Allergies: Negative.     Blood pressure 105/65, pulse 83, temperature 97.8 F (36.6 C), temperature source Oral, resp. rate 16, height 5\' 9"  (1.753 m), weight 82.1 kg (181 lb).Body mass index is 26.73 kg/m.  General Appearance: Guarded  Eye Contact:  Minimal  Speech:  Clear and Coherent and Pressured  Volume:  Decreased  Mood:  Depressed, Dysphoric and Hopeless  Affect:  Depressed and Labile  Thought Process:  Goal Directed and Descriptions of Associations: Intact  Orientation:  Full (Time, Place, and Person)  Thought Content:  Hallucinations: Auditory Command:  telling him to ingest objects  Suicidal Thoughts:  Yes.  with intent/plan  Homicidal Thoughts:  No  Memory:  Immediate;   Fair Recent;   Fair  Judgement:  Fair  Insight:  Lacking  Psychomotor Activity:  Normal  Concentration:  Concentration: Good  Recall:  Good  Fund of Knowledge:  Good  Language:  Good  Akathisia:  Negative  Handed:  Right  AIMS (if indicated):     Assets:  Financial Resources/Insurance Housing Social Support  ADL's:  Intact  Cognition:  WNL  Sleep:  Number of Hours: 6.75     Treatment Plan Summary: Daily contact with patient to assess and evaluate symptoms and progress in treatment, Medication management and Plan is to:  -Continue Ativan CIWA protocol -Increase Abilify 5 mg PO Daily for mood stability -Discontinue Wellbutrin -Continue Vistaril 25 mg PO Q6H PRN for anxiety -Continue Cymbalta 120  mg PO Daily for mood stability -Encourage group therapy participation -Requested nursing staff to move him closer to nursing station  Maryfrances Bunnell, FNP 08/06/2017, 3:09 PM

## 2017-08-06 NOTE — Progress Notes (Signed)
D    Pt is pleasant on approach and cooperative   He interacts well with others   He endorses voices at times and intrusive thoughts    He is very polite and respectful of others  A    Verbal support given   Medications administered and effectiveness monitored    Q 15 min checks R    Pt is safe at present time

## 2017-08-07 ENCOUNTER — Telehealth: Payer: Self-pay | Admitting: *Deleted

## 2017-08-07 DIAGNOSIS — F1910 Other psychoactive substance abuse, uncomplicated: Secondary | ICD-10-CM

## 2017-08-07 DIAGNOSIS — R443 Hallucinations, unspecified: Secondary | ICD-10-CM

## 2017-08-07 DIAGNOSIS — G4700 Insomnia, unspecified: Secondary | ICD-10-CM

## 2017-08-07 MED ORDER — LORAZEPAM 1 MG PO TABS
ORAL_TABLET | ORAL | Status: AC
  Filled 2017-08-07: qty 1

## 2017-08-07 MED ORDER — ARIPIPRAZOLE 5 MG PO TABS
7.0000 mg | ORAL_TABLET | Freq: Every day | ORAL | Status: DC
  Administered 2017-08-08: 7 mg via ORAL
  Filled 2017-08-07 (×3): qty 1

## 2017-08-07 NOTE — Progress Notes (Signed)
Patient did not attend AA group meeting. 

## 2017-08-07 NOTE — Progress Notes (Signed)
Patient reports increased auditory hallucinations causing him a great deal of distress.  Patient has been isolative to his room for the majority of the shift.  Patient denies HI but reports some suicidal thoughts.   Assess patient for safety, offer medications as prescribed engage patient in 1:1 staff talks.   Patient able to contract for safety, Continue to monitor as prescribed.

## 2017-08-07 NOTE — Progress Notes (Signed)
Recreation Therapy Notes  Date: 08/07/17 Time: 0915 Location: 300 [address] Group Room  Group Topic: Stress Management  Goal Area(s) Addresses:  Patient will verbalize importance of using healthy stress management.  Patient will identify positive emotions associated with healthy stress management.   Behavioral Response: Engaged  Intervention: Stress Management  Activity :  Body Scan Meditation.  LRT introduced the stress management technique of meditation.  LRT played a meditation from the Calm app to allow patients to take inventory of any tension, calmness or other sensations they may have been feeling.  Patients were to follow along as the meditation played.  Education:  Stress Management, Discharge Planning.   Education Outcome: Acknowledges edcuation/In group clarification offered/Needs additional education  Clinical Observations/Feedback: Pt attended group.   Caroll Rancher, LRT/CTRS         Caroll Rancher A 08/07/2017 11:36 AM

## 2017-08-07 NOTE — Progress Notes (Signed)
[facility] MD Progress Note  08/07/2017 3:04 PM Arthur [name]  MRN:  [id number]   Subjective: Arthur [name] reports, "I feel frustrated because I'm not feeling any better. The voices are bothering me. I feel very discouraged. The voices are intrusive. They are showing up even in my dreams. The voices are telling me to drink the liquid soap & die. I know better. I will not drink any liquid soap. But, I did gather the liquid soap I have here & gave it to the staff".   Objective: Arthur [name] is seen, chart reviewed. He is alert, oriented & aware of situation. He is lying down in his bed and becomes tearful when talking about the voices that are telling him things to drink liquid soap. He says he feels frustrated & discouraged. However, he says he gathered the soap in his room & gave it to the staff because he knows better to not drink liquid soap. Will increase the Abilify to 7.5 mg PO Daily for the intrusive auditory hallucinations that were commanding in nature. Patient is exercising some logical thinking about the substances that he soul not ingest. On another note, patient does not appear to be responding to any internal stimuli. Staff continue to provided support.  Principal Problem: Major depressive disorder, recurrent severe without psychotic features (HCC) Diagnosis:   Patient Active Problem List   Diagnosis Date Noted  . Alcohol use disorder, severe, dependence (HCC) [F10.20] 08/03/2017  . Major depressive disorder, recurrent severe without psychotic features (HCC) [F33.2] 08/03/2017  . Alcohol abuse [F10.10] 12/21/2016  . Alcohol-induced mood disorder (HCC) [F10.94] 12/20/2016  . TIA (transient ischemic attack) [G45.9] 11/17/2016  . HTN (hypertension) [I10] 11/17/2016  . 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 Alcoholism (HCC) [F10.20] 06/20/2016  . Hypoglycemia [E16.2] 04/05/2016  . Diarrhea [R19.7] 11/25/2015  . Flank pain [R10.9] 11/25/2015  . Lower back pain [M54.5]  11/25/2015  . Loss of weight [R63.4] 11/17/2015  . 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 HIV disease (HCC) [B20] 11/12/2014  . 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 Elevated fasting blood sugar [R73.01] 05/15/2012  . H/O surgical procedure [Z98.890] 05/15/2012  . Lung mass [R91.8] 05/15/2012  . History of surgical procedure [Z98.890] 05/15/2012   Total Time spent with patient: 15 minutes  Past Psychiatric History: See H&P  Past Medical History:  Past Medical History:  Diagnosis Date  . Alcohol abuse 12/21/2016  . Alcoholism (HCC) 06/20/2016  . Bipolar disorder (HCC)   . Cough 11/17/2015  . Depression   . Diarrhea 11/25/2015  . Difficulty voiding 12/15/2015  . Dysuria 11/25/2015  . Fever, unspecified 11/17/2015  . Flank pain 11/25/2015  . History of blood transfusion 12-12-1998  . HIV infection (HCC)   . HTN (hypertension) 11/17/2016  . 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 Late syphilis   . Lower back pain 11/25/2015  . 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 TIA (transient ischemic attack) 11/17/2016    Past Surgical History:  Procedure Laterality Date  . broken right leg Right 2000  . FRACTURE SURGERY    . GASTRIC BYPASS  2012   [facility]   Family History:  Family History  Problem Relation Age of Onset  . Heart disease Mother   . Stroke Mother   . Heart disease Father    Family Psychiatric  History: See H&P Social History:  History  Alcohol Use  . 50.4 oz/week  . 84 Cans of beer per week  Comment: every day use     History  Drug Use No    Social History   Social History  . Marital status: Single    Spouse name: N/A  . Number of children: N/A  . Years of education: N/A   Social History Main Topics  . Smoking status: Never Smoker  . Smokeless tobacco: Never Used  . Alcohol use 50.4 oz/week    84 Cans of beer per week     Comment: every day use  . Drug use: No  . Sexual activity: Not Currently     Partners: Male   Other Topics Concern  . None   Social History Narrative  . None   Additional Social History:    Sleep: Good  Appetite:  Good  Current Medications: Current Facility-Administered Medications  Medication Dose Route Frequency Provider Last Rate Last Dose  . acetaminophen (TYLENOL) tablet 650 mg  650 mg Oral Q6H PRN Charm Rings, NP      . alum & mag hydroxide-simeth (MAALOX/MYLANTA) 200-200-20 MG/5ML suspension 30 mL  30 mL Oral Q6H PRN Charm Rings, NP      . ARIPiprazole (ABILIFY) tablet 5 mg  5 mg Oral Daily Money, Gerlene Burdock, FNP   5 mg at 08/07/17 0759  . aspirin chewable tablet 81 mg  81 mg Oral Daily Charm Rings, NP   81 mg at 08/07/17 0759  . docusate sodium (COLACE) capsule 100 mg  100 mg Oral Daily Truman Hayward, FNP   100 mg at 08/07/17 0801  . dolutegravir (TIVICAY) tablet 50 mg  50 mg Oral Daily Charm Rings, NP   50 mg at 08/07/17 0759  . DULoxetine (CYMBALTA) DR capsule 120 mg  120 mg Oral Daily Charm Rings, NP   120 mg at 08/07/17 0759  . emtricitabine-tenofovir AF (DESCOVY) 200-25 MG per tablet 1 tablet  1 tablet Oral Daily Charm Rings, NP   1 tablet at 08/07/17 0759  . hydrochlorothiazide (HYDRODIURIL) tablet 25 mg  25 mg Oral Daily Charm Rings, NP   25 mg at 08/07/17 1610  . hydrocortisone cream 1 %   Topical BID PRN Charm Rings, NP      . hydrocortisone cream 1 %   Topical BID Nira Conn A, NP      . ibuprofen (ADVIL,MOTRIN) tablet 600 mg  600 mg Oral Q8H PRN Charm Rings, NP      . LORazepam (ATIVAN) 1 MG tablet           . [START ON 08/08/2017] LORazepam (ATIVAN) tablet 1 mg  1 mg Oral Daily Nira Conn A, NP      . magnesium hydroxide (MILK OF MAGNESIA) suspension 30 mL  30 mL Oral Daily PRN Charm Rings, NP      . multivitamin with minerals tablet 1 tablet  1 tablet Oral Daily Nira Conn A, NP   1 tablet at 08/07/17 0759  . ondansetron (ZOFRAN) tablet 4 mg  4 mg Oral Q8H PRN Charm Rings, NP      .  rosuvastatin (CRESTOR) tablet 10 mg  10 mg Oral QHS Nira Conn A, NP   10 mg at 08/06/17 2159  . tamsulosin (FLOMAX) capsule 0.4 mg  0.4 mg Oral BID Charm Rings, NP   0.4 mg at 08/07/17 9604  . thiamine (VITAMIN B-1) tablet 100 mg  100 mg Oral Daily Charm Rings, NP   100 mg at 08/07/17 0759  .  traZODone (DESYREL) tablet 50 mg  50 mg Oral QHS,MR X 1 Nira Conn A, NP   50 mg at 08/06/17 2159   Lab Results:  Results for orders placed or performed during the hospital encounter of 08/03/17 (from the past 48 hour(s))  Glucose, capillary     Status: Abnormal   Collection Time: 08/06/17  5:08 PM  Result Value Ref Range   Glucose-Capillary 100 (H) 65 - 99 mg/dL   Comment 1 Notify RN    Comment 2 Document in Chart    Blood Alcohol level:  Lab Results  Component Value Date   ETH 185 (H) 08/02/2017   ETH 143 (H) 12/19/2016   Metabolic Disorder Labs: Lab Results  Component Value Date   HGBA1C 5.4 10/10/2016   No results found for: PROLACTIN Lab Results  Component Value Date   CHOL 120 (L) 05/25/2016   TRIG 52 05/25/2016   HDL 63 05/25/2016   CHOLHDL 1.9 05/25/2016   VLDL 10 05/25/2016   LDLCALC 47 05/25/2016   LDLCALC 62 10/19/2015    Physical Findings: AIMS: Facial and Oral Movements Muscles of Facial Expression: None, normal Lips and Perioral Area: None, normal Jaw: None, normal Tongue: None, normal,Extremity Movements Upper (arms, wrists, hands, fingers): None, normal Lower (legs, knees, ankles, toes): None, normal, Trunk Movements Neck, shoulders, hips: None, normal, Overall Severity Severity of abnormal movements (highest score from questions above): None, normal Incapacitation due to abnormal movements: None, normal Patient's awareness of abnormal movements (rate only patient's report): No Awareness, Dental Status Current problems with teeth and/or dentures?: No Does patient usually wear dentures?: No  CIWA:  CIWA-Ar Total: 0 COWS:      Musculoskeletal: Strength & Muscle Tone: within normal limits Gait & Station: normal Patient leans: N/A  Psychiatric Specialty Exam: Physical Exam  Nursing note and vitals reviewed. Constitutional: He is oriented to person, place, and time. He appears well-developed and well-nourished.  Cardiovascular: Normal rate.   Respiratory: Effort normal.  Musculoskeletal: Normal range of motion.  Neurological: He is alert and oriented to person, place, and time.  Skin: Skin is warm.    Review of Systems  Constitutional: Negative.   HENT: Negative.   Eyes: Negative.   Respiratory: Negative.   Cardiovascular: Negative.   Gastrointestinal: Negative.   Genitourinary: Negative.   Musculoskeletal: Negative.   Skin: Negative.   Neurological: Negative.   Endo/Heme/Allergies: Negative.   Psychiatric/Behavioral: Positive for depression, hallucinations (Uditory hallucinations) and substance abuse (Hx. alcoholism, chronic.). Negative for memory loss and suicidal ideas. The patient has insomnia. The patient is not nervous/anxious.     Blood pressure 113/79, pulse 86, temperature (!) 97.5 F (36.4 C), resp. rate 20, height 5\' 9"  (1.753 m), weight 82.1 kg (181 lb).Body mass index is 26.73 kg/m.  General Appearance: Guarded  Eye Contact:  Minimal  Speech:  Clear and Coherent and Pressured  Volume:  Decreased  Mood:  Depressed, Dysphoric and Hopeless  Affect:  Depressed and Labile  Thought Process:  Goal Directed and Descriptions of Associations: Intact  Orientation:  Full (Time, Place, and Person)  Thought Content:  Hallucinations: Auditory Command:  telling him to ingest objects  Suicidal Thoughts:  Yes.  with intent/plan  Homicidal Thoughts:  No  Memory:  Immediate;   Fair Recent;   Fair  Judgement:  Fair  Insight:  Lacking  Psychomotor Activity:  Normal  Concentration:  Concentration: Good  Recall:  Good  Fund of Knowledge:  Good  Language:  Good  Akathisia:  Negative  Handed:   Right  AIMS (if indicated):     Assets:  Financial Resources/Insurance Housing Social Support  ADL's:  Intact  Cognition:  WNL  Sleep:  Number of Hours: 6.25   Treatment Plan Summary: Daily contact with patient to assess and evaluate symptoms and progress in treatment, Medication management and Plan is to:   Will continue today 08/07/17 plan as below except where it is noted. -Continue Ativan detoxification protocols for alcohol detox. -Increased the Abilify to 7.5 mg PO Daily for worsening psychosis (auditory hallucinations). -Continue Vistaril 25 mg PO Q6H PRN for anxiety -Continue Cymbalta 120 mg PO Daily for mood stability  - Continue 15 minutes observation for safety concerns - Encouraged to participate in milieu therapy and group therapy counseling sessions and also work with coping skills -  Develop treatment plan to decrease risk of relapse upon discharge and to reduce the need for readmission. -  Psycho-social education regarding relapse prevention and self care. - Health care follow up as needed for medical problems. - Restart home medications where appropriate.  Sanjuana Kava, NP, PMHNP, FNP-BC. 08/07/2017, 3:04 PMPatient ID: Arthur [name], male   DOB: 24-Jan-1956, 61 y.o.   MRN: [id number] Agree with NP Progress Note

## 2017-08-07 NOTE — Tx Team (Signed)
Interdisciplinary Treatment and Diagnostic Plan Update  08/07/2017 Time of Session: 0830AM Arthur [name] MRN: [id number]  Principal Diagnosis: Major depressive disorder, recurrent severe without psychotic features (HCC)  Secondary Diagnoses: Principal Problem:   Major depressive disorder, recurrent severe without psychotic features (HCC) Active Problems:   Alcohol use disorder, severe, dependence (HCC)   Current Medications:  Current Facility-Administered Medications  Medication Dose Route Frequency Provider Last Rate Last Dose  . acetaminophen (TYLENOL) tablet 650 mg  650 mg Oral Q6H PRN Charm Rings, NP      . alum & mag hydroxide-simeth (MAALOX/MYLANTA) 200-200-20 MG/5ML suspension 30 mL  30 mL Oral Q6H PRN Charm Rings, NP      . ARIPiprazole (ABILIFY) tablet 5 mg  5 mg Oral Daily Money, Gerlene Burdock, FNP   5 mg at 08/07/17 0759  . aspirin chewable tablet 81 mg  81 mg Oral Daily Charm Rings, NP   81 mg at 08/07/17 0759  . docusate sodium (COLACE) capsule 100 mg  100 mg Oral Daily Truman Hayward, FNP   100 mg at 08/07/17 0801  . dolutegravir (TIVICAY) tablet 50 mg  50 mg Oral Daily Charm Rings, NP   50 mg at 08/07/17 0759  . DULoxetine (CYMBALTA) DR capsule 120 mg  120 mg Oral Daily Charm Rings, NP   120 mg at 08/07/17 0759  . emtricitabine-tenofovir AF (DESCOVY) 200-25 MG per tablet 1 tablet  1 tablet Oral Daily Charm Rings, NP   1 tablet at 08/07/17 0759  . hydrochlorothiazide (HYDRODIURIL) tablet 25 mg  25 mg Oral Daily Charm Rings, NP   25 mg at 08/07/17 3295  . hydrocortisone cream 1 %   Topical BID PRN Charm Rings, NP      . hydrocortisone cream 1 %   Topical BID Nira Conn A, NP      . ibuprofen (ADVIL,MOTRIN) tablet 600 mg  600 mg Oral Q8H PRN Charm Rings, NP      . LORazepam (ATIVAN) 1 MG tablet           . [START ON 08/08/2017] LORazepam (ATIVAN) tablet 1 mg  1 mg Oral Daily Nira Conn A, NP      . magnesium hydroxide (MILK OF MAGNESIA)  suspension 30 mL  30 mL Oral Daily PRN Charm Rings, NP      . multivitamin with minerals tablet 1 tablet  1 tablet Oral Daily Nira Conn A, NP   1 tablet at 08/07/17 0759  . ondansetron (ZOFRAN) tablet 4 mg  4 mg Oral Q8H PRN Charm Rings, NP      . rosuvastatin (CRESTOR) tablet 10 mg  10 mg Oral QHS Nira Conn A, NP   10 mg at 08/06/17 2159  . tamsulosin (FLOMAX) capsule 0.4 mg  0.4 mg Oral BID Charm Rings, NP   0.4 mg at 08/07/17 1884  . thiamine (VITAMIN B-1) tablet 100 mg  100 mg Oral Daily Charm Rings, NP   100 mg at 08/07/17 0759  . traZODone (DESYREL) tablet 50 mg  50 mg Oral QHS,MR X 1 Nira Conn A, NP   50 mg at 08/06/17 2159   PTA Medications: Prescriptions Prior to Admission  Medication Sig Dispense Refill Last Dose  . aspirin 81 MG tablet Take 1 tablet (81 mg total) by mouth daily. (Patient not taking: Reported on 08/02/2017) 30 tablet 11 Not Taking at Unknown time  . buPROPion (WELLBUTRIN) 100 MG tablet Take  1 tablet by mouth 3 (three) times daily.  5 08/02/2017 at Unknown time  . DESCOVY 200-25 MG tablet TAKE 1 TABLET BY MOUTH DAILY 30 tablet 5 08/02/2017 at Unknown time  . dolutegravir (TIVICAY) 50 MG tablet Take 1 tablet (50 mg total) by mouth daily. 30 tablet 5 08/02/2017 at Unknown time  . DULoxetine (CYMBALTA) 60 MG capsule Take 2 capsules (120 mg total) by mouth daily. 60 capsule 3 08/02/2017 at Unknown time  . rosuvastatin (CRESTOR) 10 MG tablet Take 1 tablet (10 mg total) by mouth daily. 30 tablet 11 08/02/2017 at Unknown time  . sildenafil (VIAGRA) 100 MG tablet Take 1 tablet (100 mg total) by mouth daily as needed for erectile dysfunction. Come through patient assistance 10 tablet 5 unknown  . tamsulosin (FLOMAX) 0.4 MG CAPS capsule Take 1 capsule (0.4 mg total) by mouth 2 (two) times daily. 60 capsule 11 08/02/2017 at Unknown time    Patient Stressors: Medication change or noncompliance Substance abuse Other: auditory hallucinations  Patient Strengths:  Ability for insight Average or above average intelligence Capable of independent living General fund of knowledge Motivation for treatment/growth  Treatment Modalities: Medication Management, Group therapy, Case management,  1 to 1 session with clinician, Psychoeducation, Recreational therapy.   Physician Treatment Plan for Primary Diagnosis: Major depressive disorder, recurrent severe without psychotic features (HCC) Long Term Goal(s): Improvement in symptoms so as ready for discharge Improvement in symptoms so as ready for discharge   Short Term Goals: Ability to verbalize feelings will improve Ability to disclose and discuss suicidal ideas Ability to maintain clinical measurements within normal limits will improve Compliance with prescribed medications will improve  Medication Management: Evaluate patient's response, side effects, and tolerance of medication regimen.  Therapeutic Interventions: 1 to 1 sessions, Unit Group sessions and Medication administration.  Evaluation of Outcomes: Progressing  Physician Treatment Plan for Secondary Diagnosis: Principal Problem:   Major depressive disorder, recurrent severe without psychotic features (HCC) Active Problems:   Alcohol use disorder, severe, dependence (HCC)  Long Term Goal(s): Improvement in symptoms so as ready for discharge Improvement in symptoms so as ready for discharge   Short Term Goals: Ability to verbalize feelings will improve Ability to disclose and discuss suicidal ideas Ability to maintain clinical measurements within normal limits will improve Compliance with prescribed medications will improve     Medication Management: Evaluate patient's response, side effects, and tolerance of medication regimen.  Therapeutic Interventions: 1 to 1 sessions, Unit Group sessions and Medication administration.  Evaluation of Outcomes: Progressing   RN Treatment Plan for Primary Diagnosis: Major depressive disorder,  recurrent severe without psychotic features (HCC) Long Term Goal(s): Knowledge of disease and therapeutic regimen to maintain health will improve  Short Term Goals: Ability to remain free from injury will improve, Ability to disclose and discuss suicidal ideas and Ability to identify and develop effective coping behaviors will improve  Medication Management: RN will administer medications as ordered by provider, will assess and evaluate patient's response and provide education to patient for prescribed medication. RN will report any adverse and/or side effects to prescribing provider.  Therapeutic Interventions: 1 on 1 counseling sessions, Psychoeducation, Medication administration, Evaluate responses to treatment, Monitor vital signs and CBGs as ordered, Perform/monitor CIWA, COWS, AIMS and Fall Risk screenings as ordered, Perform wound care treatments as ordered.  Evaluation of Outcomes: Progressing   LCSW Treatment Plan for Primary Diagnosis: Major depressive disorder, recurrent severe without psychotic features (HCC) Long Term Goal(s): Safe transition to appropriate next level  of care at discharge, Engage patient in therapeutic group addressing interpersonal concerns.  Short Term Goals: Engage patient in aftercare planning with referrals and resources, Facilitate acceptance of mental health diagnosis and concerns and Facilitate patient progression through stages of change regarding substance use diagnoses and concerns  Therapeutic Interventions: Assess for all discharge needs, 1 to 1 time with Social worker, Explore available resources and support systems, Assess for adequacy in community support network, Educate family and significant other(s) on suicide prevention, Complete Psychosocial Assessment, Interpersonal group therapy.  Evaluation of Outcomes: Progressing   Progress in Treatment: Attending groups: Yes. Participating in groups: Yes. Taking medication as prescribed:  Yes. Toleration medication: Yes. Family/Significant other contact made: No, will contact:  pt's partner prior to discharge Patient understands diagnosis: Yes. Discussing patient identified problems/goals with staff: Yes. Medical problems stabilized or resolved: Yes. Denies suicidal/homicidal ideation: Yes. Issues/concerns per patient self-inventory: Yes. Other: pt continues to endorse AH. MD notified during treatment team/progression.   New problem(s) identified: No, Describe:  n/a  New Short Term/Long Term Goal(s): detox, medication management for mood stabilization/elimination of AVH, development of comprehensive mental wellness/sobriety plan.   Discharge Plan or Barriers: Pt plans to return home; follow-up appts needed with his current providers: [facility] and [facility]. [facility] pamphlet and AA/NA lists provided to pt for additional community support.   Patient Goal: "to get help in getting rid of the voices."   Reason for Continuation of Hospitalization: Depression Hallucinations Medication stabilization Suicidal ideation Withdrawal symptoms  Estimated Length of Stay: Wed 08/09/17  Attendees: Patient: 08/07/2017 11:09 AM  Physician: Dr. Jama Flavors MD 08/07/2017 11:09 AM  Nursing: Marton Redwood RN; Christa RN 08/07/2017 11:09 AM  RN Care Manager: Onnie Boer CM 08/07/2017 11:09 AM  Social Worker: Chartered loss adjuster, LCSW 08/07/2017 11:09 AM  Recreational Therapist: x 08/07/2017 11:09 AM  Other: Armandina Stammer NP; Hillery Jacks NP 08/07/2017 11:09 AM  Other:  08/07/2017 11:09 AM  Other: 08/07/2017 11:09 AM    Scribe for Treatment Team: Ledell Peoples Smart, LCSW 08/07/2017 11:09 AM

## 2017-08-07 NOTE — Telephone Encounter (Signed)
Patient called to let the doctor know that he is inpatient at [facility] for the next couple of days and if we need to talk to him call him at [phone] CODE [phone]

## 2017-08-07 NOTE — [facility] Group Notes (Signed)
[facility] LCSW Group Therapy  08/07/2017 1:32 PM  Type of Therapy:  Group Therapy  Participation Level:  Active  Participation Quality:  Attentive  Affect:  Appropriate  Cognitive:  Oriented  Insight:  Improving  Engagement in Therapy:  Improving  Modes of Intervention:  Discussion, Education, Exploration, Socialization and Support  Summary of Progress/Problems: MHA Speaker came to talk about his personal journey with mentla illness. The pt processed ways by which to relate to the speaker. MHA speaker provided handouts and educational information pertaining to groups and services offered by the [facility].   Arthur [name] N Smart LCSW 08/07/2017, 1:32 PM

## 2017-08-08 DIAGNOSIS — F1010 Alcohol abuse, uncomplicated: Secondary | ICD-10-CM

## 2017-08-08 DIAGNOSIS — I10 Essential (primary) hypertension: Secondary | ICD-10-CM

## 2017-08-08 DIAGNOSIS — G459 Transient cerebral ischemic attack, unspecified: Secondary | ICD-10-CM

## 2017-08-08 DIAGNOSIS — A539 Syphilis, unspecified: Secondary | ICD-10-CM

## 2017-08-08 DIAGNOSIS — Z560 Unemployment, unspecified: Secondary | ICD-10-CM

## 2017-08-08 DIAGNOSIS — F1994 Other psychoactive substance use, unspecified with psychoactive substance-induced mood disorder: Secondary | ICD-10-CM

## 2017-08-08 MED ORDER — MIRTAZAPINE 15 MG PO TABS
15.0000 mg | ORAL_TABLET | Freq: Every day | ORAL | Status: DC
  Administered 2017-08-08 – 2017-08-12 (×5): 15 mg via ORAL
  Filled 2017-08-08 (×6): qty 1
  Filled 2017-08-08 (×2): qty 7
  Filled 2017-08-08: qty 1

## 2017-08-08 MED ORDER — ARIPIPRAZOLE 15 MG PO TABS
15.0000 mg | ORAL_TABLET | Freq: Every day | ORAL | Status: DC
  Administered 2017-08-09 – 2017-08-10 (×2): 15 mg via ORAL
  Filled 2017-08-08 (×4): qty 1

## 2017-08-08 MED ORDER — ARIPIPRAZOLE 5 MG PO TABS
5.0000 mg | ORAL_TABLET | Freq: Once | ORAL | Status: AC
  Administered 2017-08-08: 5 mg via ORAL
  Filled 2017-08-08 (×2): qty 1

## 2017-08-08 NOTE — [facility] Group Notes (Signed)
LCSW Group Therapy Note   08/08/2017 1:15pm   Type of Therapy and Topic:  Group Therapy:  Overcoming Obstacles   Participation Level:  Active   Description of Group:    In this group patients will be encouraged to explore what they see as obstacles to their own wellness and recovery. They will be guided to discuss their thoughts, feelings, and behaviors related to these obstacles. The group will process together ways to cope with barriers, with attention given to specific choices patients can make. Each patient will be challenged to identify changes they are motivated to make in order to overcome their obstacles. This group will be process-oriented, with patients participating in exploration of their own experiences as well as giving and receiving support and challenge from other group members.   Therapeutic Goals: 1. Patient will identify personal and current obstacles as they relate to admission. 2. Patient will identify barriers that currently interfere with their wellness or overcoming obstacles.  3. Patient will identify feelings, thought process and behaviors related to these barriers. 4. Patient will identify two changes they are willing to make to overcome these obstacles:      Summary of Patient Progress Arthur [name] was attentive and engaged during today's processing group. He shared that his biggest obstacle is "re-establishing my support network. I tend to push people away and isolate when I'm struggling." Arthur [name] identified his main support as his partner and his current mental health and medical providers. "I also go to AA and NA groups. That helps me a lot." Arthur [name] continues to show progress in the group setting with improving insight.      Therapeutic Modalities:   Cognitive Behavioral Therapy Solution Focused Therapy Motivational Interviewing Relapse Prevention Therapy  Ledell Peoples Smart, LCSW 08/08/2017 12:56 PM

## 2017-08-08 NOTE — Progress Notes (Signed)
D: Pt was in bed in his room upon initial approach.  Pt presents with depressed affect and mood.  He describes his day as "so so."  His goal is to "take all my meds, go to meetings, eat, get some rest, and I did all that."  Pt denies HI, denies VH, denies pain.  Pt endorses command AH telling him to "drink Clorox."  Endorses thoughts of self-harm.   Pt has been visible in milieu with few peer interactions.      A: Introduced self to pt.  Actively listened to pt and offered support and encouragement. Medications administered per order.  Q15 minute safety checks maintained.  R: Pt is safe on the unit.  Pt is compliant with medications.  Pt verbally contracts for safety.  Will continue to monitor and assess.
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
## 2017-08-08 NOTE — Progress Notes (Signed)
Pt did not attend AA meeting this evening.  

## 2017-08-08 NOTE — Plan of Care (Signed)
Problem: Activity: Goal: Sleeping patterns will improve Outcome: Progressing Slept 6.25 hours last night according to flowsheet.    

## 2017-08-08 NOTE — Progress Notes (Signed)
Nursing Progress Note: 7p-7a D: Pt currently presents with a anxious/pleasant affect and behavior. Pt states "I love Dr. Bea Laura. He is great and I finally feel confident in my meds, what I'm taking, and where I will go after this." Interacting appropriately with the milieu. Pt reports fair sleep during the previous night with current medication regimen. Pt did not attend wrap-up group.  A: Pt provided with medications per providers orders. Pt's labs and vitals were monitored throughout the night. Pt supported emotionally and encouraged to express concerns and questions. Pt educated on medications.  R: Pt's safety ensured with 15 minute and environmental checks. Pt currently denies SI, HI, and AVH. Pt verbally contracts to seek staff if SI,HI, or AVH occurs and to consult with staff before acting on any harmful thoughts. Will continue to monitor.

## 2017-08-08 NOTE — Progress Notes (Signed)
Patient reports increased auditory hallucinations causing him a great deal of distress.  Patient has been isolative to his room for the majority of the shift.  Patient denies HI but reports some suicidal thoughts.   Assess patient for safety, offer medications as prescribed engage patient in 1:1 staff talks.   Patient able to contract for safety, Continue to monitor as prescribed.  

## 2017-08-09 NOTE — Progress Notes (Signed)
Nursing Progress Note: 7p-7a D: Pt currently presents with a pleasant/anxious affect and behavior. Pt states "I feel a lot better today the voices are becoming less and less. I still hear them saying they are trying to get out of me." Interacting appropriately with the milieu. Pt reports good sleep during the previous night with current medication regimen. Pt did attend wrap-up group.  A: Pt provided with medications per providers orders. Pt's labs and vitals were monitored throughout the night. Pt supported emotionally and encouraged to express concerns and questions. Pt educated on medications.  R: Pt's safety ensured with 15 minute and environmental checks. Pt currently denies SI, HI, and AVH. Pt verbally contracts to seek staff if SI,HI, or AVH occurs and to consult with staff before acting on any harmful thoughts. Will continue to monitor.

## 2017-08-09 NOTE — [facility] Group Notes (Signed)
LCSW Group Therapy Note  08/09/2017 1:15pm  Type of Therapy/Topic:  Group Therapy:  Balance in Life  Participation Level:  Active  Description of Group:    This group will address the concept of balance and how it feels and looks when one is unbalanced. Patients will be encouraged to process areas in their lives that are out of balance and identify reasons for remaining unbalanced. Facilitators will guide patients in utilizing problem-solving interventions to address and correct the stressor making their life unbalanced. Understanding and applying boundaries will be explored and addressed for obtaining and maintaining a balanced life. Patients will be encouraged to explore ways to assertively make their unbalanced needs known to significant others in their lives, using other group members and facilitator for support and feedback.  Therapeutic Goals: 1. Patient will identify two or more emotions or situations they have that consume much of in their lives. 2. Patient will identify signs/triggers that life has become out of balance:  3. Patient will identify two ways to set boundaries in order to achieve balance in their lives:  4. Patient will demonstrate ability to communicate their needs through discussion and/or role plays  Summary of Patient Progress: Described being in balance as "calm, no jitters."  Mood fluctuations contribute to feeling unbalanced "I Roe CoombsDon even know what I will feel like when I wake up in the morning - sometimes I feel good and paint and sometimes I dont even get out of bed or shower fo 5 days."  Healthy coping skills include calling members of his support system and his sponsor.  Developing insight and increasingly able to reach out to peers for validation and support.     Therapeutic Modalities:   Cognitive Behavioral Therapy Solution-Focused Therapy Assertiveness Training  Sallee Langenne C Cunningham, [place] 08/09/2017 3:04 PM
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
## 2017-08-09 NOTE — [facility] Suicide Risk Assessment (Signed)
[facility] INPATIENT:  Family/Significant Other Suicide Prevention Education  Suicide Prevention Education:  Contact Attempts: Rex Darl Pikes [phone]) pt's partner has been identified by the patient as the family member/significant other with whom the patient will be residing, and identified as the person(s) who will aid the patient in the event of a mental health crisis.  With written consent from the patient, two attempts were made to provide suicide prevention education, prior to and/or following the patient's discharge.  We were unsuccessful in providing suicide prevention education.  A suicide education pamphlet was given to the patient to share with family/significant other.  Date and time of first attempt:08/08/17 at 4:12PM Date and time of second attempt: 08/09/17 at 11:58AM (voicemail left requesting call back at earliest convenience).   Arthur [name] 08/09/2017, 12:00 PM

## 2017-08-09 NOTE — Progress Notes (Signed)
Patient ID: Arthur [name], male   DOB: 1956/12/02, 61 y.o.   MRN: [id number] D  ---  Pt agrees to contract for safety and denies pain.  Pt started the morning isolating himself to his room.  He later became interactive with peers in dayroom and has been pleasant with staff.  He has good eye contact and shows no negative behaviors.  Pt takes medications as asked with no sign of adverse effects.   Pt denies any AV hall. And appears vested in treatment.  --- A ---  Support and safety provided.[name].   -- R --  Pt remains safe on unit

## 2017-08-09 NOTE — Progress Notes (Signed)
Recreation Therapy Notes  Date:  08/09/17 Time: 0930 Location: [address] Dayroom  Group Topic: Stress Management  Goal Area(s) Addresses:  Patient will verbalize importance of using healthy stress management.  Patient will identify positive emotions associated with healthy stress management.   Intervention: Stress Management  Activity :  Guided Imagery.  LRT introduced the stress management technique of guided imagery.  LRT read a script so patients could follow along and imagine being in their peaceful place.  Patients were to follow along as LRT read the script to engage in the activity.  Education: Stress Management, Discharge Planning.   Education Outcome: Acknowledges edcuation/In group clarification offered/Needs additional education  Clinical Observations/Feedback:  Pt did not attend group.    Arthur [name], LRT/CTRS        Arthur [name] A 08/09/2017 12:31 PM 

## 2017-08-10 MED ORDER — ARIPIPRAZOLE 10 MG PO TABS
20.0000 mg | ORAL_TABLET | Freq: Every day | ORAL | Status: DC
  Administered 2017-08-11 – 2017-08-13 (×3): 20 mg via ORAL
  Filled 2017-08-10: qty 14
  Filled 2017-08-10: qty 2
  Filled 2017-08-10: qty 14
  Filled 2017-08-10 (×3): qty 2

## 2017-08-10 NOTE — Progress Notes (Signed)
D: Pt presents brighter on approach this morning. Pt reported feeling somewhat better today compared to yesterday. Pt observed engaging with others on the milieu and attending groups. Pt appears well groomed today. Pt rates depression 7/10. Anxiety 7/10. Pt endorses passive suicidal thoughts with no plan or intent. Pt endorses AH telling him to drink bleach to cleanse himself. Pt requesting to have Abilify increased today to help decrease the AH. MD made aware of pt request.  A: Medications reviewed with pt. Medications administered as ordered per MD. Verbal support provided. Pt encouraged to attend groups. 15 minute checks performed for safety. R: Pt compliant with tx.
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
## 2017-08-10 NOTE — Progress Notes (Signed)
Nursing Progress Note: 7p-7a D: Pt currently presents with a pleasant affect and behavior. Pt states "Being here has allowed me to take a step back from daily life to see my past for what it is. I can cope with things I have been refusing to think about for years. I am finally on meds that decrease the intensity and occurrence of the voices in my med. For the first time in as long time I feel that I feel free." Interacting appropriately with the milieu. Pt reports good sleep during the previous night with current medication regimen.  A: Pt provided with medications per providers orders. Pt's labs and vitals were monitored throughout the night. Pt supported emotionally and encouraged to express concerns and questions. Pt educated on medications.  R: Pt's safety ensured with 15 minute and environmental checks. Pt currently denies SI, HI, and AVH. Pt verbally contracts to seek staff if SI,HI, or AVH occurs and to consult with staff before acting on any harmful thoughts. Will continue to monitor.

## 2017-08-10 NOTE — [facility] Group Notes (Signed)
LCSW Group Therapy Note  08/10/2017 1:15pm  Type of Therapy/Topic:  Group Therapy:  Balance in Life  Participation Level:  Did Not Attend  Description of Group:    This group will address the concept of balance and how it feels and looks when one is unbalanced. Patients will be encouraged to process areas in their lives that are out of balance and identify reasons for remaining unbalanced. Facilitators will guide patients in utilizing problem-solving interventions to address and correct the stressor making their life unbalanced. Understanding and applying boundaries will be explored and addressed for obtaining and maintaining a balanced life. Patients will be encouraged to explore ways to assertively make their unbalanced needs known to significant others in their lives, using other group members and facilitator for support and feedback.  Therapeutic Goals: 1. Patient will identify two or more emotions or situations they have that consume much of in their lives. 2. Patient will identify signs/triggers that life has become out of balance:  3. Patient will identify two ways to set boundaries in order to achieve balance in their lives:  4. Patient will demonstrate ability to communicate their needs through discussion and/or role plays  Summary of Patient Progress:      Therapeutic Modalities:   Cognitive Behavioral Therapy Solution-Focused Therapy Assertiveness Training  Rasul Decola L Kamela Blansett, LCSW 08/10/2017 2:33 PM   

## 2017-08-11 MED ORDER — DIPHENHYDRAMINE HCL 25 MG PO CAPS
25.0000 mg | ORAL_CAPSULE | Freq: Once | ORAL | Status: AC
  Administered 2017-08-11: 25 mg via ORAL
  Filled 2017-08-11 (×2): qty 1

## 2017-08-11 NOTE — Progress Notes (Signed)
Patient ID: Arthur [name], male   DOB: [birth year], 61 y.o.   MRN: [id number]  D: Pt in dayroom interacting well with peers on approach. Patient reports he had a good day. Mood and affect appears anxious about upcoming discharge. Pt hoping to get a pet after disharge. Pt attended evening AA group and interacted well with peers. Denies SI/HI/VH and pain.Pt endorses audiotory hallucination but states "it's minimal". No behavioral issues noted.  A: Support and encouragement offered as needed. Medications administered as prescribed.  R: Patient is safe and cooperative on unit. Will continue to monitor  for safety and stability.

## 2017-08-11 NOTE — Tx Team (Signed)
Interdisciplinary Treatment and Diagnostic Plan Update  08/11/2017 Time of Session: 0830AM Shoichi Mielke MRN: [id number]  Principal Diagnosis: Major depressive disorder, recurrent severe without psychotic features (HCC)  Secondary Diagnoses: Principal Problem:   Major depressive disorder, recurrent severe without psychotic features (HCC) Active Problems:   Alcohol use disorder, severe, dependence (HCC)   Current Medications:  Current Facility-Administered Medications  Medication Dose Route Frequency Provider Last Rate Last Dose  . acetaminophen (TYLENOL) tablet 650 mg  650 mg Oral Q6H PRN Charm Rings, NP      . alum & mag hydroxide-simeth (MAALOX/MYLANTA) 200-200-20 MG/5ML suspension 30 mL  30 mL Oral Q6H PRN Charm Rings, NP      . ARIPiprazole (ABILIFY) tablet 20 mg  20 mg Oral Daily Izediuno, Delight Ovens, MD   20 mg at 08/11/17 0826  . aspirin chewable tablet 81 mg  81 mg Oral Daily Charm Rings, NP   81 mg at 08/11/17 8295  . docusate sodium (COLACE) capsule 100 mg  100 mg Oral Daily Truman Hayward, FNP   100 mg at 08/11/17 0827  . dolutegravir (TIVICAY) tablet 50 mg  50 mg Oral Daily Charm Rings, NP   50 mg at 08/11/17 6213  . DULoxetine (CYMBALTA) DR capsule 120 mg  120 mg Oral Daily Charm Rings, NP   120 mg at 08/11/17 0827  . emtricitabine-tenofovir AF (DESCOVY) 200-25 MG per tablet 1 tablet  1 tablet Oral Daily Charm Rings, NP   1 tablet at 08/11/17 0827  . hydrochlorothiazide (HYDRODIURIL) tablet 25 mg  25 mg Oral Daily Charm Rings, NP   25 mg at 08/11/17 0827  . hydrocortisone cream 1 %   Topical BID PRN Charm Rings, NP      . hydrocortisone cream 1 %   Topical BID Jackelyn Poling, NP   1 application at 08/11/17 [phone]  . ibuprofen (ADVIL,MOTRIN) tablet 600 mg  600 mg Oral Q8H PRN Charm Rings, NP      . magnesium hydroxide (MILK OF MAGNESIA) suspension 30 mL  30 mL Oral Daily PRN Charm Rings, NP      . mirtazapine (REMERON) tablet 15 mg  15  mg Oral QHS Georgiann Cocker, MD   15 mg at 08/10/17 2108  . multivitamin with minerals tablet 1 tablet  1 tablet Oral Daily Nira Conn A, NP   1 tablet at 08/11/17 0827  . ondansetron (ZOFRAN) tablet 4 mg  4 mg Oral Q8H PRN Charm Rings, NP      . rosuvastatin (CRESTOR) tablet 10 mg  10 mg Oral QHS Nira Conn A, NP   10 mg at 08/10/17 2108  . tamsulosin (FLOMAX) capsule 0.4 mg  0.4 mg Oral BID Charm Rings, NP   0.4 mg at 08/11/17 7846  . thiamine (VITAMIN B-1) tablet 100 mg  100 mg Oral Daily Charm Rings, NP   100 mg at 08/11/17 9629   PTA Medications: Prescriptions Prior to Admission  Medication Sig Dispense Refill Last Dose  . aspirin 81 MG tablet Take 1 tablet (81 mg total) by mouth daily. (Patient not taking: Reported on 08/02/2017) 30 tablet 11 Not Taking at Unknown time  . buPROPion (WELLBUTRIN) 100 MG tablet Take 1 tablet by mouth 3 (three) times daily.  5 08/02/2017 at Unknown time  . DESCOVY 200-25 MG tablet TAKE 1 TABLET BY MOUTH DAILY 30 tablet 5 08/02/2017 at Unknown time  . dolutegravir (TIVICAY) 50  MG tablet Take 1 tablet (50 mg total) by mouth daily. 30 tablet 5 08/02/2017 at Unknown time  . DULoxetine (CYMBALTA) 60 MG capsule Take 2 capsules (120 mg total) by mouth daily. 60 capsule 3 08/02/2017 at Unknown time  . rosuvastatin (CRESTOR) 10 MG tablet Take 1 tablet (10 mg total) by mouth daily. 30 tablet 11 08/02/2017 at Unknown time  . sildenafil (VIAGRA) 100 MG tablet Take 1 tablet (100 mg total) by mouth daily as needed for erectile dysfunction. Come through patient assistance 10 tablet 5 unknown  . tamsulosin (FLOMAX) 0.4 MG CAPS capsule Take 1 capsule (0.4 mg total) by mouth 2 (two) times daily. 60 capsule 11 08/02/2017 at Unknown time    Patient Stressors: Medication change or noncompliance Substance abuse Other: auditory hallucinations  Patient Strengths: Ability for insight Average or above average intelligence Capable of independent living General fund of  knowledge Motivation for treatment/growth  Treatment Modalities: Medication Management, Group therapy, Case management,  1 to 1 session with clinician, Psychoeducation, Recreational therapy.   Physician Treatment Plan for Primary Diagnosis: Major depressive disorder, recurrent severe without psychotic features (HCC) Long Term Goal(s): Improvement in symptoms so as ready for discharge Improvement in symptoms so as ready for discharge   Short Term Goals: Ability to verbalize feelings will improve Ability to disclose and discuss suicidal ideas Ability to maintain clinical measurements within normal limits will improve Compliance with prescribed medications will improve  Medication Management: Evaluate patient's response, side effects, and tolerance of medication regimen.  Therapeutic Interventions: 1 to 1 sessions, Unit Group sessions and Medication administration.  Evaluation of Outcomes: Progressing  Physician Treatment Plan for Secondary Diagnosis: Principal Problem:   Major depressive disorder, recurrent severe without psychotic features (HCC) Active Problems:   Alcohol use disorder, severe, dependence (HCC)  Long Term Goal(s): Improvement in symptoms so as ready for discharge Improvement in symptoms so as ready for discharge   Short Term Goals: Ability to verbalize feelings will improve Ability to disclose and discuss suicidal ideas Ability to maintain clinical measurements within normal limits will improve Compliance with prescribed medications will improve     Medication Management: Evaluate patient's response, side effects, and tolerance of medication regimen.  Therapeutic Interventions: 1 to 1 sessions, Unit Group sessions and Medication administration.  Evaluation of Outcomes: Progressing   RN Treatment Plan for Primary Diagnosis: Major depressive disorder, recurrent severe without psychotic features (HCC) Long Term Goal(s): Knowledge of disease and therapeutic  regimen to maintain health will improve  Short Term Goals: Ability to remain free from injury will improve, Ability to disclose and discuss suicidal ideas and Ability to identify and develop effective coping behaviors will improve  Medication Management: RN will administer medications as ordered by provider, will assess and evaluate patient's response and provide education to patient for prescribed medication. RN will report any adverse and/or side effects to prescribing provider.  Therapeutic Interventions: 1 on 1 counseling sessions, Psychoeducation, Medication administration, Evaluate responses to treatment, Monitor vital signs and CBGs as ordered, Perform/monitor CIWA, COWS, AIMS and Fall Risk screenings as ordered, Perform wound care treatments as ordered.  Evaluation of Outcomes: Progressing   LCSW Treatment Plan for Primary Diagnosis: Major depressive disorder, recurrent severe without psychotic features (HCC) Long Term Goal(s): Safe transition to appropriate next level of care at discharge, Engage patient in therapeutic group addressing interpersonal concerns.  Short Term Goals: Engage patient in aftercare planning with referrals and resources, Facilitate acceptance of mental health diagnosis and concerns and Facilitate patient progression through  stages of change regarding substance use diagnoses and concerns  Therapeutic Interventions: Assess for all discharge needs, 1 to 1 time with Child psychotherapistocial worker, Explore available resources and support systems, Assess for adequacy in community support network, Educate family and significant other(s) on suicide prevention, Complete Psychosocial Assessment, Interpersonal group therapy.  Evaluation of Outcomes: Progressing   Progress in Treatment: Attending groups: Yes. Participating in groups: Yes. Taking medication as prescribed: Yes. Toleration medication: Yes. Family/Significant other contact made: Contact attempts made with pt's partner. SPE  completed with pt.  Patient understands diagnosis: Yes. Discussing patient identified problems/goals with staff: Yes. Medical problems stabilized or resolved: Yes. Denies suicidal/homicidal ideation: Yes. Issues/concerns per patient self-inventory: No.  Other: n/a  New problem(s) identified: No, Describe:  n/a  New Short Term/Long Term Goal(s): detox, medication management for mood stabilization/elimination of AVH, development of comprehensive mental wellness/sobriety plan.   Discharge Plan or Barriers: Pt plans to return home; follow-up appts made with his current providers: [facility] and [facility]. [facility] pamphlet and AA/NA lists provided to pt for additional community support.   Patient Goal: "to get help in getting rid of the voices."   Reason for Continuation of Hospitalization: medication management   Estimated Length of Stay: Sunday 08/13/17  Attendees: Patient: 08/11/2017 8:47 AM  Physician: Dr. Jackquline BerlinIzediuno MD 08/11/2017 8:47 AM  Nursing: Maris Bergeronna, Dan RN 08/11/2017 8:47 AM  RN Care Manager: Onnie BoerJennifer Clark CM 08/11/2017 8:47 AM  Social Worker: Trula SladeHeather Smart, LCSW 08/11/2017 8:47 AM  Recreational Therapist: x 08/11/2017 8:47 AM  Other: Armandina StammerAgnes Nwoko NP; Feliz Beamravis Money NP 08/11/2017 8:47 AM  Other:  08/11/2017 8:47 AM  Other: 08/11/2017 8:47 AM    Scribe for Treatment Team: Ledell PeoplesHeather N Smart, LCSW 08/11/2017 8:47 AM

## 2017-08-11 NOTE — [facility] Group Notes (Signed)
LCSW Group Therapy Note  08/11/2017 1:15pm  Type of Therapy and Topic:  Group Therapy:  Feelings around Relapse and Recovery  Participation Level:  Active   Description of Group:    Patients in this group will discuss emotions they experience before and after a relapse. They will process how experiencing these feelings, or avoidance of experiencing them, relates to having a relapse. Facilitator will guide patients to explore emotions they have related to recovery. Patients will be encouraged to process which emotions are more powerful. They will be guided to discuss the emotional reaction significant others in their lives may have to their relapse or recovery. Patients will be assisted in exploring ways to respond to the emotions of others without this contributing to a relapse.  Therapeutic Goals: 1. Patient will identify two or more emotions that lead to a relapse for them 2. Patient will identify two emotions that result when they relapse 3. Patient will identify two emotions related to recovery 4. Patient will demonstrate ability to communicate their needs through discussion and/or role plays   Summary of Patient Progress:  Arthur [name] was attentive and engaged during today's processing group. He shared that he is able to love himself throughout his life experiences, but continues to struggle with "being overwhelmed with life stress like being HIV positive, hearing voices, and feeling guilty." Arthur [name] shared that he hoped to learn about his triggers from this relapse experience and is "both frustrated and relieved to learn about post acute withdrawal syndrome." "Know I know what is going on with me, but I can't believe I never learned about this in the past. "     Therapeutic Modalities:   Cognitive Behavioral Therapy Solution-Focused Therapy Assertiveness Training Relapse Prevention Therapy   Ledell PeoplesHeather N Smart, LCSW 08/11/2017 2:53 PM

## 2017-08-11 NOTE — Progress Notes (Signed)
  [facility]  Will you be returning to the same living situation after discharge:  Yes,  home At discharge, do you have transportation home?: Yes,  family member-pt is scheduled for Sunday discharge per Dr. Jackquline BerlinIzediuno if he remains stable. (08/13/17). Do you have the ability to pay for your medications: Yes,  mental health  Release of information consent forms completed and submitted to medical records by CSW.  Patient to Follow up at: Follow-up Information    [facility] Follow up on 08/15/2017.   Specialty:  Behavioral Health Why:  Hospital follow-up on Tuesday at 9:30AM. Thank you.  Contact information: [address] [place] [address] [phone]        [facility] on 08[facility]16/2017.   Specialty:  Infectious Diseases Why:  Appt with Dr. Algis LimingVanDam at 10:30AM on this date. Thank you.  Contact information: [address] [id number] [place] [id number] [phone]          Next level of care provider has access to [facility] Link:no  Safety Planning and Suicide Prevention discussed: Yes,  SPE completed with pt; contact attempts made with pt.   Have you used any form of tobacco in the last 30 days? (Cigarettes, Smokeless Tobacco, Cigars, and/or Pipes): No  Has patient been referred to the Quitline?: N/A patient is not a smoker  Patient has been referred for addiction treatment: Yes  Pulte HomesHeather N Smart, LCSW 08/11/2017, 8:50 AM
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
## 2017-08-11 NOTE — Progress Notes (Signed)
Nursing Shift Note :  Nursing Progress Note: 7-7p  D- Mood is depressed and anxious,pt reports hearing voices telling him to drink bleach.. Affect is blunted and appropriate. Pt is able to contract for safety. Continues to have difficulty staying asleep due to voices but states there less. Goal for today is to work on discharge plans. Pt reports feeling anxious regarding discharge.  A - Observed pt interacting in group and in the milieu.Support and encouragement offered, safety maintained with q 15 minutes. Pt attended team meeting and stated he was motivated to stop drinking and take his medications.[name].  R-Contracts for safety and continues to follow treatment plan, working on learning new coping skills. Educated pt regarding medications. Pt is excited about getting a letter for a dog.

## 2017-08-11 NOTE — Progress Notes (Signed)
Recreation Therapy Notes  Date: 08/11/17 Time: 0930 Location: 500 Hall Dayroom  Group Topic: Stress Management  Goal Area(s) Addresses:  Patient will verbalize importance of using healthy stress management.  Patient will identify positive emotions associated with healthy stress management.   Behavioral Response: Engaged  Intervention: Stress Management  Activity :  Progressive Muscle Relaxation.  LRT introduced the stress management technique of progressive muscle relaxation.  Patients were to follow along as LRT read script to fully engage in the technique.  Education:  Stress Management, Discharge Planning.   Education Outcome: Acknowledges edcuation/In group clarification offered/Needs additional education  Clinical Observations/Feedback: Pt attended group.   Caroll RancherMarjette Jj Enyeart, LRT/CTRS        Lillia AbedLindsay, Eyvette Cordon A 08/11/2017 12:00 PM

## 2017-08-12 MED ORDER — DIPHENHYDRAMINE HCL 25 MG PO CAPS
ORAL_CAPSULE | ORAL | Status: AC
  Administered 2017-08-12: 22:00:00
  Filled 2017-08-12: qty 1

## 2017-08-12 MED ORDER — DIPHENHYDRAMINE HCL 25 MG PO CAPS
25.0000 mg | ORAL_CAPSULE | Freq: Once | ORAL | Status: AC
  Filled 2017-08-12: qty 1

## 2017-08-12 NOTE — Progress Notes (Signed)
Adult Psychoeducational Group Note  Date:  08/12/2017 Time:  1300  Group Topic/Focus:  Identifying Needs:   The focus of this group is to help patients identify their personal needs that have been historically problematic and identify healthy behaviors to address their needs.  Participation Level:  Active  Participation Quality:  Appropriate  Affect:  Appropriate  Cognitive:  Appropriate  Insight: Appropriate  Engagement in Group:  Engaged  Modes of Intervention:  Discussion and Education  Additional Comments:    Edrei Norgaard L 08/12/2017, 4:27 PM

## 2017-08-12 NOTE — Progress Notes (Signed)
D.  Pt pleasant on approach, denies complaints at this time.  Pt was positive for evening AA group, observed engaged in appropriate interaction with peers on the unit.  Pt denies SI/HI/AVH at this time.  Reports looking forward to discharge tomorrow.  A. Support and encouragement offered, medication given as ordered  R. Pt remains safe on the unit.  Will continue to monitor.

## 2017-08-12 NOTE — Progress Notes (Signed)
Data. Patient denies SI/HI/AVH. Verbally contracts for safety on the unit and to come to staff before acting of any self harm thoughts/feelings/voices.  Patient interacting well with staff and other patients.  Action. Emotional support and encouragement offered. Education provided on medication, indications and side effect. Q 15 minute checks done for safety. Response. Safety on the unit maintained through 15 minute checks.  Medications taken as prescribed. Attended groups. Remained calm and appropriate through out shift. 
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
## 2017-08-12 NOTE — [facility] Group Notes (Signed)
LCSW Group Therapy Note  08/12/2017     10:00-11:00AM  Type of Therapy and Topic:  Group Therapy:  Decisional Balance/Substance Use  Participation Level:  Active        . Description of Group:  The main focus of today's process group was learning how to use a decisional balance exercise to make a decision about whether to change an unhealthy coping skill, as well as how to use the information gathered in the actual process of planning that change.  Patients listed some of their most frequently utilized unhealthy coping techniques and CSW pointed out the similarities.  Motivational Interviewing and the whiteboard were utilized to help patients explore in-depth the perceived benefits and costs of a specific, shared unhealthy coping technique (drinking & drugging) as well as the benefits and costs of replacing that with other, healthy coping skills.  A handout was distributed for patients to be able to do this exercise for themselves.     Therapeutic Goals 1. Patient will be able to utilize the decision balance exercise on their own 2. Patient will list coping skills they use to fulfill their needs 3. Patient will identify the differences between healthy and  unhealthy coping skills 4. Patient will verbalize the costs and benefits of drinking/drugging versus making the choice to change 5. Patient will learn how to use the exercise to identify the most important supports to put in place so that they can succeed in a change to which they commit  Summary of Patient Progress: During group, patient expressed that he isolates himself severely and drinks.  He was very interested in group, kept losing his train of thought and had to be reminded of what we were discussing numerous times.   Therapeutic Modalities Cognitive Behavioral Therapy Motivational Interviewing   Lynnell ChadMareida J Grossman-Orr, [place] 08/12/2017 12:45 PM

## 2017-08-13 MED ORDER — DULOXETINE HCL 60 MG PO CPEP: 120.0000 mg | ORAL_CAPSULE | Freq: Every day | ORAL | 0 refills | Status: DC

## 2017-08-13 MED ORDER — ROSUVASTATIN CALCIUM 10 MG PO TABS: 10.0000 mg | ORAL_TABLET | Freq: Every day | ORAL | 0 refills | Status: DC

## 2017-08-13 MED ORDER — HYDROCHLOROTHIAZIDE 25 MG PO TABS: 25.0000 mg | ORAL_TABLET | Freq: Every day | ORAL | 0 refills | Status: DC

## 2017-08-13 MED ORDER — ASPIRIN 81 MG PO TABS: 81.0000 mg | ORAL_TABLET | Freq: Every day | ORAL | 0 refills | Status: DC

## 2017-08-13 MED ORDER — DOLUTEGRAVIR SODIUM 50 MG PO TABS: 50.0000 mg | ORAL_TABLET | Freq: Every day | ORAL | 0 refills | Status: DC

## 2017-08-13 MED ORDER — EMTRICITABINE-TENOFOVIR AF 200-25 MG PO TABS
1.0000 | ORAL_TABLET | Freq: Every day | ORAL | 0 refills | Status: DC
Start: 2017-08-13 — End: 2017-08-21

## 2017-08-13 MED ORDER — HYDROCORTISONE 1 % EX CREA: TOPICAL_CREAM | Freq: Two times a day (BID) | CUTANEOUS | 0 refills | Status: DC | PRN

## 2017-08-13 MED ORDER — ARIPIPRAZOLE 20 MG PO TABS: 20.0000 mg | ORAL_TABLET | Freq: Every day | ORAL | 0 refills | Status: DC

## 2017-08-13 MED ORDER — MIRTAZAPINE 15 MG PO TABS: 15.0000 mg | ORAL_TABLET | Freq: Every day | ORAL | 0 refills | Status: DC

## 2017-08-13 MED ORDER — DOCUSATE SODIUM 100 MG PO CAPS: 100.0000 mg | ORAL_CAPSULE | Freq: Every day | ORAL | 0 refills | Status: DC

## 2017-08-13 MED ORDER — TAMSULOSIN HCL 0.4 MG PO CAPS: 0.4000 mg | ORAL_CAPSULE | Freq: Two times a day (BID) | ORAL | 0 refills | Status: DC

## 2017-08-13 NOTE — Progress Notes (Signed)
Patient did attend the evening speaker AA meeting.  

## 2017-08-13 NOTE — Progress Notes (Signed)
Data. Patient denies SI/HI. Continues to endorse, "Voices, but they are less than yesterday and I am able to ignore them." Verbally contracts for safety on the unit and to come to staff before acting of any self harm thoughts/feelings/voices.  Patient interacting well with staff and other patients. Affect is bright and patient is very excited about getting a therapy dog when he gets home. He also reported, "I am really ready to get back to my apartment." Action. Emotional support and encouragement offered. Education provided on medication, indications and side effect. Q 15 minute checks done for safety. Response. Safety on the unit maintained through 15 minute checks.  Medications taken as prescribed. Attended groups. Remained calm and appropriate through out shift.  Pt. discharged to lobby.  Belongings sheet reviewed and signed by pt. and all belongings, including home HIV meds, sample medications and medication scripts,  sent home. Paperwork reviewed and pt. able to verbalize understanding of education. Pt. in no current distress and ambulatory.
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
## 2017-08-13 NOTE — Discharge Summary (Signed)
Physician Discharge Summary Note  Patient:  Arthur LackWesley [name] is an 61 y.o., male  MRN:  [id number]  DOB:  08-04-1956  Patient phone:  [phone] (home)   Patient address:   [address] [place] [id number],   Total Time spent with patient: Greater than 30 minutes  Date of Admission:  08/03/2017 Date of Discharge: 08/13/2017  Reason for Admission: Per HPI- Patient with history of Major depression, HIV, latentsyphilis, severealcohol use disorder who was IVC'd by his counselor due to suicidal ideation.Patient reports that he has been getting increasingly depressed, hopeless, helpless and has stopped taking care of his personal hygiene. Patient reports recurrent suicidal thoughts and has been having command auditory hallucination. "I have been hearing voices telling me to drink Clorox to get clean from HIV, Alcohol AND BEING gay.'' Patient reports that he has been drinking lots of alcohol on a daily basis to suppress depression but denies other illicit drug use.61 year old male [facility] for hearing voices telling him to drink Clorox in order to clean himself. Patient stated that he did not feel clean inside, gives history of previous suicidal ideation with planning but no attempt. Patient states the patient has been drinking excessively and feels that his contribution to his order tree hallucinations. Patient adds that sometimes he drinks in order to keep the voice is quiet. He reports that he's been diagnosed with bipolar disorder in the past, has HIV, latent syphilis and also has a history of TIAs Patient states that he did well in the past on Abilify, is no longer on it. He reports that he currently is on Cymbalta and Wellbutrin XL. He states that he needs something to help with his voices, help stabilize his mood. He acknowledges that he is overwhelmed because of the voices and was scared that he would act on what the voices were saying to him. Patient denies  any homicidal ideation, any paranoia. In regards to his depression, patient states that his mood fluctuates on a scale of 0-10 with 0 being no symptoms in 10 being the worst between a 5 and a 7. Patient agrees to start Abilify 2 mg PO BID and decrease the Wellbutrin to 150 mg with continued taper down and discontinue. Patient states that he will be safe on the unit and participate in groups and interact with staff  Principal Problem: Major depressive disorder, recurrent severe without psychotic features [facility](HCC)  Discharge Diagnoses: Patient Active Problem List   Diagnosis Date Noted  . Alcohol use disorder, severe, dependence (HCC) [F10.20] 08/03/2017  . Major depressive disorder, recurrent severe without psychotic features (HCC) [F33.2] 08/03/2017  . Alcohol abuse [F10.10] 12/21/2016  . Alcohol-induced mood disorder (HCC) [F10.94] 12/20/2016  . TIA (transient ischemic attack) [G45.9] 11/17/2016  . HTN (hypertension) [I10] 11/17/2016  . 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 Alcoholism (HCC) [F10.20] 06/20/2016  . Hypoglycemia [E16.2] 04/05/2016  . Diarrhea [R19.7] 11/25/2015  . Flank pain [R10.9] 11/25/2015  . Lower back pain [M54.5] 11/25/2015  . Loss of weight [R63.4] 11/17/2015  . 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 HIV disease (HCC) [B20] 11/12/2014  . 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 Elevated fasting blood sugar [R73.01] 05/15/2012  . H/O surgical procedure [Z98.890] 05/15/2012  . Lung mass [R91.8] 05/15/2012  . History of surgical procedure [Z98.890] 05/15/2012   Past Psychiatric History: Alcohol Induced mood disorder, Recurrent major-depression severe, Bipolar 1 disorder  Past Medical History:  Past Medical History:  Diagnosis  Date  . Alcohol abuse 12/21/2016  . Alcoholism (HCC) 06/20/2016  . Bipolar disorder (HCC)   . Cough 11/17/2015  . Depression   . Diarrhea 11/25/2015  . Difficulty voiding 12/15/2015  . Dysuria  11/25/2015  . Fever, unspecified 11/17/2015  . Flank pain 11/25/2015  . History of blood transfusion 12-12-1998  . HIV infection (HCC)   . HTN (hypertension) 11/17/2016  . 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 Late syphilis   . Lower back pain 11/25/2015  . 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 TIA (transient ischemic attack) 11/17/2016    Past Surgical History:  Procedure Laterality Date  . broken right leg Right 2000  . FRACTURE SURGERY    . GASTRIC BYPASS  2012   [facility]   Family History:  Family History  Problem Relation Age of Onset  . Heart disease Mother   . Stroke Mother   . Heart disease Father    Family Psychiatric  History:   Social History:  History  Alcohol Use  . 50.4 oz/week  . 84 Cans of beer per week    Comment: every day use     History  Drug Use No    Social History   Social History  . Marital status: Single    Spouse name: N/A  . Number of children: N/A  . Years of education: N/A   Social History Main Topics  . Smoking status: Never Smoker  . Smokeless tobacco: Never Used  . Alcohol use 50.4 oz/week    84 Cans of beer per week     Comment: every day use  . Drug use: No  . Sexual activity: Not Currently    Partners: Male   Other Topics Concern  . None   Social History Narrative  . None    Hospital Course:  Arthur [name] was admitted for Major depressive disorder, recurrent severe without psychotic features (HCC) and crisis management.  Pt was treated discharged with the medications listed below under Medication List.  Medical problems were identified and treated as needed.  Home medications were restarted as appropriate.  Improvement was monitored by observation and Arthur [name] 's daily report of symptom reduction.  Emotional and mental status was monitored by daily self-inventory reports completed by Arthur [name] and clinical staff.         Arthur [name] was evaluated by the treatment team for  stability and plans for continued recovery upon discharge. Arthur [name] 's motivation was an integral factor for scheduling further treatment. Employment, transportation, bed availability, health status, family support, and any pending legal issues were also considered during hospital stay. Pt was offered further treatment options upon discharge including but not limited to Residential, Intensive Outpatient, and Outpatient treatment.  Arthur [name] will follow up with the services as listed below under Follow Up Information.     Upon completion of this admission the patient was both mentally and medically stable for discharge denying suicidal/homicidal ideation, auditory/visual/tactile hallucinations, delusional thoughts and paranoia.    Arthur [name] responded well to treatment with Abilify 20 mg , Cymbalta 120 mg  and Remeron 15 mg  without adverse effects. As noted in chart was was engaged  in treatment for AA.  Pt demonstrated improvement without reported or observed adverse effects to the point of stability appropriate for outpatient management. Pertinent labs include:CBC, CMP, and HIV for which outpatient follow-up is necessary for lab recheck as mentioned below. Reviewed CBC, CMP, BA+185, and UDS ; all unremarkable aside  from noted exceptions.   Physical Findings: AIMS: Facial and Oral Movements Muscles of Facial Expression: None, normal Lips and Perioral Area: None, normal Jaw: None, normal Tongue: None, normal,Extremity Movements Upper (arms, wrists, hands, fingers): None, normal Lower (legs, knees, ankles, toes): None, normal, Trunk Movements Neck, shoulders, hips: None, normal, Overall Severity Severity of abnormal movements (highest score from questions above): None, normal Incapacitation due to abnormal movements: None, normal Patient's awareness of abnormal movements (rate only patient's report): No Awareness, Dental Status Current problems with teeth and/or dentures?: No Does  patient usually wear dentures?: No  CIWA:  CIWA-Ar Total: 0 COWS:     Musculoskeletal: Strength & Muscle Tone: within normal limits Gait & Station: normal Patient leans: N/A  Psychiatric Specialty Exam: See SRA by MD Physical Exam  Constitutional: He appears well-developed.  HENT:  Head: Normocephalic.  Eyes: Pupils are equal, round, and reactive to light.  Cardiovascular: Normal rate.   Respiratory: Effort normal.  GI: Soft.  Genitourinary:  Genitourinary Comments: Deferred  Musculoskeletal: Normal range of motion.  Neurological: He is alert.  Skin: Skin is warm.  Psychiatric: He has a normal mood and affect. His behavior is normal.    Review of Systems  Psychiatric/Behavioral: Positive for depression (Stable), hallucinations (Hx. auditory hallucinations) and substance abuse (Hx. alcoholism). Negative for memory loss and suicidal ideas. The patient has insomnia (Stable). The patient is not nervous/anxious.     Blood pressure 117/82, pulse 65, temperature (!) 97.5 F (36.4 C), temperature source Oral, resp. rate 16, height 5\' 9"  (1.753 m), weight 82.1 kg (181 lb).Body mass index is 26.73 kg/m.  See Md's SRA   Have you used any form of tobacco in the last 30 days? (Cigarettes, Smokeless Tobacco, Cigars, and/or Pipes): No  Has this patient used any form of tobacco in the last 30 days? (Cigarettes, Smokeless Tobacco, Cigars, and/or Pipes)  No  Blood Alcohol level:  Lab Results  Component Value Date   ETH 185 (H) 08/02/2017   ETH 143 (H) 12/19/2016   Metabolic Disorder Labs:  Lab Results  Component Value Date   HGBA1C 5.4 10/10/2016   No results found for: PROLACTIN Lab Results  Component Value Date   CHOL 120 (L) 05/25/2016   TRIG 52 05/25/2016   HDL 63 05/25/2016   CHOLHDL 1.9 05/25/2016   VLDL 10 05/25/2016   LDLCALC 47 05/25/2016   LDLCALC 62 10/19/2015   See Psychiatric Specialty Exam and Suicide Risk Assessment completed by Attending Physician prior to  discharge.  Discharge destination:  Home  Is patient on multiple antipsychotic therapies at discharge:  No   Has Patient had three or more failed trials of antipsychotic monotherapy by history:  No  Recommended Plan for Multiple Antipsychotic Therapies: NA  Allergies as of 08/13/2017   No Known Allergies     Medication List    STOP taking these medications   buPROPion 100 MG tablet Commonly known as:  WELLBUTRIN   sildenafil 100 MG tablet Commonly known as:  VIAGRA     TAKE these medications     Indication  ARIPiprazole 20 MG tablet Commonly known as:  ABILIFY Take 1 tablet (20 mg total) by mouth daily. For mood stability  Indication:  Mood control   aspirin 81 MG tablet Take 1 tablet (81 mg total) by mouth daily. For heart health What changed:  additional instructions  Indication:  Heart health   docusate sodium 100 MG capsule Commonly known as:  COLACE Take 1 capsule (100 mg  total) by mouth daily. (May purchase from over the counter at the pharmacy): For constipation  Indication:  Constipation   dolutegravir 50 MG tablet Commonly known as:  TIVICAY Take 1 tablet (50 mg total) by mouth daily. For HIV infection What changed:  additional instructions  Indication:  HIV Disease   DULoxetine 60 MG capsule Commonly known as:  CYMBALTA Take 2 capsules (120 mg total) by mouth daily. For depression What changed:  additional instructions  Indication:  Major Depressive Disorder   emtricitabine-tenofovir AF 200-25 MG tablet Commonly known as:  DESCOVY Take 1 tablet by mouth daily. For HIV infection What changed:  See the new instructions.  Indication:  HIV Disease   hydrochlorothiazide 25 MG tablet Commonly known as:  HYDRODIURIL Take 1 tablet (25 mg total) by mouth daily. For high blood pressure  Indication:  High Blood Pressure Disorder   hydrocortisone cream 1 % Apply topically 2 (two) times daily as needed for itching.  Indication:  Itching   mirtazapine 15  MG tablet Commonly known as:  REMERON Take 1 tablet (15 mg total) by mouth at bedtime. For depression/sleep  Indication:  Major Depressive Disorder, Insomnia   rosuvastatin 10 MG tablet Commonly known as:  CRESTOR Take 1 tablet (10 mg total) by mouth at bedtime. For high cholesterol What changed:  when to take this  additional instructions  Indication:  Inherited Homozygous Hypercholesterolemia, Elevation of Both Cholesterol and Triglycerides in Blood   tamsulosin 0.4 MG Caps capsule Commonly known as:  FLOMAX Take 1 capsule (0.4 mg total) by mouth 2 (two) times daily. For prostate health What changed:  additional instructions  Indication:  Benign Enlargement of Prostate      Follow-up Information    [facility] Follow up on 08/15/2017.   Specialty:  Behavioral Health Why:  Hospital follow-up on Tuesday at 9:30AM. Thank you.  Contact information: [address] [place] [address] [phone]        [facility]ious Disease Follow up on 08/16/2017.   Specialty:  Infectious Diseases Why:  Appt with Dr. Algis Liming at 10:30AM on this date. Thank you.  Contact information: [address] [place], [address] [place] [id number] [place] [address] [phone]         Follow-up recommendations: Activity:  As tolerated Diet: As recommended by your primary care doctor. Keep all scheduled follow-up appointments as recommended.  Comments: Patient is instructed prior to discharge to: Take all medications as prescribed by his/her mental healthcare provider. Report any adverse effects and or reactions from the medicines to his/her outpatient provider promptly. Patient has been instructed & cautioned: To not engage in alcohol and or illegal drug use while on prescription medicines. In the event of worsening symptoms, patient is instructed to call the crisis hotline, 911 and or go to the nearest ED for appropriate evaluation  and treatment of symptoms. To follow-up with his/her primary care provider for your other medical issues, concerns and or health care needs.   Signed: Oneta Rack, NP 08/13/2017, 10:11 AM

## 2017-08-15 ENCOUNTER — Encounter: Payer: Self-pay | Admitting: Licensed Clinical Social Worker

## 2017-08-16 ENCOUNTER — Encounter: Payer: Self-pay | Admitting: *Deleted

## 2017-08-16 ENCOUNTER — Ambulatory Visit: Payer: Self-pay | Admitting: Infectious Disease

## 2017-08-16 ENCOUNTER — Telehealth: Payer: Self-pay | Admitting: *Deleted

## 2017-08-16 NOTE — Telephone Encounter (Signed)
Arthur [name] texted me this morning and said he couldn't keep the appointments. He said that appointments reminded him of betrayal and his former partner infected him purposely. He is unable to get past that until he tells him he is sorry for infecting him. He said he feels stuck. I told him to let us know when he feels up to it. Since he just got out of Behavioral health on Sunday, I do not feel that he is completely stable and have asked him to meet with me.

## 2017-08-17 ENCOUNTER — Encounter (INDEPENDENT_AMBULATORY_CARE_PROVIDER_SITE_OTHER): Payer: Self-pay | Admitting: Infectious Disease

## 2017-08-17 ENCOUNTER — Encounter: Payer: Self-pay | Admitting: Infectious Disease

## 2017-08-17 VITALS — BP 120/84 | HR 52 | Temp 98.4°F | Wt 189.2 lb

## 2017-08-17 DIAGNOSIS — G458 Other transient cerebral ischemic attacks and related syndromes: Secondary | ICD-10-CM

## 2017-08-17 DIAGNOSIS — K912 Postsurgical malabsorption, not elsewhere classified: Secondary | ICD-10-CM

## 2017-08-17 DIAGNOSIS — I2510 Atherosclerotic heart disease of native coronary artery without angina pectoris: Secondary | ICD-10-CM

## 2017-08-17 DIAGNOSIS — A529 Late syphilis, unspecified: Secondary | ICD-10-CM

## 2017-08-17 DIAGNOSIS — F1020 Alcohol dependence, uncomplicated: Secondary | ICD-10-CM

## 2017-08-17 DIAGNOSIS — F333 Major depressive disorder, recurrent, severe with psychotic symptoms: Secondary | ICD-10-CM

## 2017-08-17 DIAGNOSIS — B20 Human immunodeficiency virus [HIV] disease: Secondary | ICD-10-CM

## 2017-08-17 DIAGNOSIS — Z923 Personal history of irradiation: Secondary | ICD-10-CM

## 2017-08-17 DIAGNOSIS — I10 Essential (primary) hypertension: Secondary | ICD-10-CM

## 2017-08-17 DIAGNOSIS — F1094 Alcohol use, unspecified with alcohol-induced mood disorder: Secondary | ICD-10-CM

## 2017-08-17 HISTORY — DX: Personal history of irradiation: Z92.3
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
## 2017-08-18 NOTE — Addendum Note (Signed)
Addended by: Andree CossHOWELL, Ferdinando Lodge M on: 08/18/2017 02:54 PM   Modules accepted: Orders

## 2017-08-21 ENCOUNTER — Telehealth: Payer: Self-pay | Admitting: *Deleted

## 2017-08-21 ENCOUNTER — Other Ambulatory Visit: Payer: Self-pay | Admitting: *Deleted

## 2017-08-21 MED ORDER — BICTEGRAVIR-EMTRICITAB-TENOFOV 50-200-25 MG PO TABS: 1.0000 | ORAL_TABLET | Freq: Every day | ORAL | 11 refills | Status: DC

## 2017-08-21 NOTE — Telephone Encounter (Signed)
Arthur [name] wanted to go ahead and start Biktarvy. Dr. Daiva EvesVan Dam said it was okay to start now, so [facility]  and [facility] other [facility]. I let Arthur [name] know that it had been ordered via phone.

## 2017-08-25 MED FILL — TAMSULOSIN HCL 0.4 MG CAP: 0.4 | 60 days supply | Qty: 120 | Fill #6

## 2017-08-28 ENCOUNTER — Encounter (HOSPITAL_COMMUNITY): Payer: Self-pay | Admitting: Emergency Medicine

## 2017-08-28 ENCOUNTER — Emergency Department (HOSPITAL_COMMUNITY)
Admission: EM | Admit: 2017-08-28 | Discharge: 2017-08-28 | Disposition: A | Discharge status: Home / Self Care | Payer: Self-pay | Attending: Emergency Medicine | Admitting: Emergency Medicine

## 2017-08-28 DIAGNOSIS — F10929 Alcohol use, unspecified with intoxication, unspecified: Secondary | ICD-10-CM | POA: Insufficient documentation

## 2017-08-28 DIAGNOSIS — Z5321 Procedure and treatment not carried out due to patient leaving prior to being seen by health care provider: Secondary | ICD-10-CM | POA: Insufficient documentation

## 2017-08-28 NOTE — ED Notes (Signed)
Pt to desk asking what is taking so long  Explained to pt that we are waiting on a room to come open so the dr can see him  Pt states he is tired of waiting and wants to be seen  Explained to pt that we have long wait times tonight and we are working very hard to get him back to see the dr  Pt states he has been here for over 4 hours  Explained to pt he has only been here about 2 hours and we have people that are sick and have been waiting longer  Pt became agitated and states he was leaving and went out the doors toward the lobby

## 2017-08-28 NOTE — ED Triage Notes (Signed)
Pt states he has a drinking problem and has drank 9-12 glasses of wine today  Pt states he is currently in a drug and alcohol program and they sent him here

## 2017-08-29 ENCOUNTER — Emergency Department (HOSPITAL_COMMUNITY)
Admission: EM | Admit: 2017-08-29 | Discharge: 2017-08-29 | Disposition: A | Discharge status: Home / Self Care | Payer: Self-pay | Attending: Emergency Medicine | Admitting: Emergency Medicine

## 2017-08-29 ENCOUNTER — Encounter (HOSPITAL_COMMUNITY): Payer: Self-pay

## 2017-08-29 DIAGNOSIS — Z7982 Long term (current) use of aspirin: Secondary | ICD-10-CM | POA: Insufficient documentation

## 2017-08-29 DIAGNOSIS — B20 Human immunodeficiency virus [HIV] disease: Secondary | ICD-10-CM | POA: Insufficient documentation

## 2017-08-29 DIAGNOSIS — Z79899 Other long term (current) drug therapy: Secondary | ICD-10-CM | POA: Insufficient documentation

## 2017-08-29 DIAGNOSIS — I10 Essential (primary) hypertension: Secondary | ICD-10-CM | POA: Insufficient documentation

## 2017-08-29 DIAGNOSIS — F1011 Alcohol abuse, in remission: Secondary | ICD-10-CM

## 2017-08-29 DIAGNOSIS — F1010 Alcohol abuse, uncomplicated: Secondary | ICD-10-CM | POA: Insufficient documentation

## 2017-08-29 NOTE — ED Notes (Signed)
Bed: WTR9 Expected date:  Expected time:  Means of arrival:  Comments: 

## 2017-08-29 NOTE — Discharge Instructions (Signed)
He was seen here today for alcohol detox. I provided you with resources to assist you with this.

## 2017-08-29 NOTE — ED Provider Notes (Signed)
[facility]-EMERGENCY DEPT Provider Note   CSN: [id number] Arrival date & time: 08/29/17  3086     History   Chief Complaint Chief Complaint  Patient presents with  . ETOH Detox    HPI Arthur [name] is a 61 y.o. male with a history of alcoholism who presents to the emergency department today for alcohol detox. The patient states that he is working with [facility] in order to get into a program which he expects to be excepted into tomorrow. He is presenting here today to receive additional recourses for quitting alcohol. The patient seems motivated. He is asymptomatic currently. He states he was slightly nauseous for a few minutes earlier but this has since subsided. He denies any current nausea, vomiting, tremor, diaphoresis, anxiety, agitation, tactile disturbances, auditory disturbances or visual disturbances, headache, chest pain, shortness of breath, abdominal pain, dysuria. No SI/HI.  [name] HPI  Past Medical History:  Diagnosis Date  . Alcohol abuse 12/21/2016  . Alcoholism (HCC) 06/20/2016  . Bipolar disorder (HCC)   . Cough 11/17/2015  . Depression   . Diarrhea 11/25/2015  . Difficulty voiding 12/15/2015  . Dysuria 11/25/2015  . Fever, unspecified 11/17/2015  . Flank pain 11/25/2015  . History of blood transfusion 12-12-1998  . History of radiation exposure 08/17/2017  . HTN (hypertension) 11/17/2016  . 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 Late syphilis   . Lower back pain 11/25/2015  . 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 TIA (transient ischemic attack) 11/17/2016    Patient Active Problem List   Diagnosis Date Noted  . History of radiation exposure 08/17/2017  . Alcohol use disorder, severe, dependence (HCC) 08/03/2017  . 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 Alcohol abuse 12/21/2016  . 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 TIA (transient ischemic attack) 11/17/2016  . HTN  (hypertension) 11/17/2016  . 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 Alcoholism (HCC) 06/20/2016  . Hypoglycemia 04/05/2016  . Diarrhea 11/25/2015  . Flank pain 11/25/2015  . Lower back pain 11/25/2015  . Loss of weight 11/17/2015  . Late syphilis   . HIV disease (HCC) 11/12/2014  . Bipolar 1 disorder (HCC) 05/15/2012  . 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 Elevated fasting blood sugar 05/15/2012  . H/O surgical procedure 05/15/2012  . Lung mass 05/15/2012  . History of surgical procedure 05/15/2012    Past Surgical History:  Procedure Laterality Date  . broken right leg Right 2000  . FRACTURE SURGERY    . GASTRIC BYPASS  2012   [facility]       Home Medications    Prior to Admission medications   Medication Sig Start Date End Date Taking? Authorizing Provider  ARIPiprazole (ABILIFY) 20 MG tablet Take 1 tablet (20 mg total) by mouth daily. For mood stability 08/14/17   Armandina Stammer I, NP  aspirin 81 MG tablet Take 1 tablet (81 mg total) by mouth daily. For heart health 08/13/17   Armandina Stammer I, NP  bictegravir-emtricitabine-tenofovir AF (BIKTARVY) 50-200-25 MG TABS tablet Take 1 tablet by mouth daily. 08/21/17   Randall Hiss, MD  docusate sodium (COLACE) 100 MG capsule Take 1 capsule (100 mg total) by mouth daily. (May purchase from over the counter at the pharmacy): For constipation 08/14/17   Armandina Stammer I, NP  DULoxetine (CYMBALTA) 60 MG capsule Take 2 capsules (120 mg total) by mouth daily. For depression 08/14/17   Sanjuana Kava, NP  hydrochlorothiazide (HYDRODIURIL) 25 MG tablet Take 1 tablet (25 mg total) by mouth daily. For high blood pressure 08/14/17   Nwoko, Nicole Kindred I, NP  hydrocortisone cream 1 % Apply topically 2 (two) times daily as needed for itching. 08/13/17   Armandina Stammer I, NP  mirtazapine (REMERON) 15 MG tablet Take 1 tablet (15 mg total) by mouth at bedtime. For depression/sleep 08/13/17   Armandina Stammer I, NP  rosuvastatin  (CRESTOR) 10 MG tablet Take 1 tablet (10 mg total) by mouth at bedtime. For high cholesterol 08/13/17   Armandina Stammer I, NP  tamsulosin (FLOMAX) 0.4 MG CAPS capsule Take 1 capsule (0.4 mg total) by mouth 2 (two) times daily. For prostate health 08/13/17   Sanjuana Kava, NP    Family History Family History  Problem Relation Age of Onset  . Heart disease Mother   . Stroke Mother   . Heart disease Father     Social History Social History  Substance Use Topics  . Smoking status: Never Smoker  . Smokeless tobacco: Never Used  . Alcohol use 50.4 oz/week    84 Cans of beer per week     Comment: every day use     Allergies   Patient has no known allergies.   Review of Systems Review of Systems  All other systems reviewed and are negative.    Physical Exam Updated Vital Signs BP 115/76   Pulse 66   Temp 98.7 F (37.1 C)   Resp 13   SpO2 99%   Physical Exam  Constitutional: He appears well-developed and well-nourished.  HENT:  Head: Normocephalic and atraumatic.  Right Ear: External ear normal.  Left Ear: External ear normal.  Nose: Nose normal.  Mouth/Throat: Uvula is midline, oropharynx is clear and moist and mucous membranes are normal. No tonsillar exudate.  Eyes: Pupils are equal, round, and reactive to light. Right eye exhibits no discharge. Left eye exhibits no discharge. No scleral icterus.  Neck: Trachea normal. Neck supple. No spinous process tenderness present. No neck rigidity. Normal range of motion present.  Cardiovascular: Normal rate, regular rhythm and intact distal pulses.   No murmur heard. Pulses:      Radial pulses are 2+ on the right side, and 2+ on the left side.       Dorsalis pedis pulses are 2+ on the right side, and 2+ on the left side.       Posterior tibial pulses are 2+ on the right side, and 2+ on the left side.  No lower extremity swelling or edema. Calves symmetric in size bilaterally.  Pulmonary/Chest: Effort normal and breath sounds  normal. He exhibits no tenderness.  Abdominal: Soft. Bowel sounds are normal. There is no tenderness. There is no rebound and no guarding.  Musculoskeletal: He exhibits no edema.  Lymphadenopathy:    He has no cervical adenopathy.  Neurological: He is alert.  Speech clear. Follows commands. No facial droop. PERRLA. EOM grossly intact. CN III-XII grossly intact. Grossly moves all extremities 4 without ataxia. Able and appropriate strength for age to upper and lower extremities bilaterally including grip strength.   Skin: Skin is warm and dry. No rash noted. He is not diaphoretic.  Psychiatric: He has a normal mood and affect.  Nursing note and vitals reviewed.    ED Treatments / Results  Labs (all labs ordered are listed, but only abnormal results are displayed) Labs Reviewed - No data to display  EKG  EKG Interpretation None  Radiology No results found.  Procedures Procedures (including critical care time)  Medications Ordered in ED Medications - No data to display   Initial Impression / Assessment and Plan / ED Course  I have reviewed the triage vital signs and the nursing notes.  Pertinent labs & imaging results that were available during my care of the patient were reviewed by me and considered in my medical decision making (see chart for details).     61 year old male presenting for resources for alcohol detox. The patient is CIWA 1. Patient is without current nausea or vomiting. No tremor. No paroxysmal swelling including diaphoresis or moist palms. Patient does not appear anxious or agitated. No tactile disturbances as stated in history of present illness. No visual disturbances or hallucinations. No headache. Patient is an A&Ox3. No tachycardia on presentation. Per CIWA patient does not require medication for withdrawal. Patient does appear to be clinically intoxicated. Patient is not having any other symptoms at this time. Exam is reassuring. Provided  resources. Appears safe for d/c.    Final Clinical Impressions(s) / ED Diagnoses   Final diagnoses:  H/O ETOH abuse    New Prescriptions New Prescriptions   No medications on file     Princella Pellegrini 08/29/17 1411    Lorre Nick, MD 08/30/17 [phone]

## 2017-08-29 NOTE — ED Triage Notes (Signed)
Pt presents wanting ETOH detox and c/o nausea.  Pt reports last drink was yesterday (9-12 glasses of wine).  Pt reports he has an application into a residential treatment program.

## 2017-08-31 ENCOUNTER — Other Ambulatory Visit: Payer: Self-pay | Admitting: *Deleted

## 2017-08-31 DIAGNOSIS — F319 Bipolar disorder, unspecified: Secondary | ICD-10-CM

## 2017-08-31 MED ORDER — MIRTAZAPINE 15 MG PO TABS: 15.0000 mg | ORAL_TABLET | Freq: Every day | ORAL | 5 refills | Status: DC

## 2017-08-31 NOTE — Telephone Encounter (Signed)
Arthur [name] said he needed his remeron refilled and aasked if Dr. Daiva Eves can renew it. He may be going in to a detox center soon and needed the medication before he goes in.

## 2017-09-07 ENCOUNTER — Other Ambulatory Visit: Payer: Self-pay | Admitting: *Deleted

## 2017-09-07 DIAGNOSIS — N529 Male erectile dysfunction, unspecified: Secondary | ICD-10-CM

## 2017-09-07 MED ORDER — SILDENAFIL CITRATE 100 MG PO TABS: 100.0000 mg | ORAL_TABLET | Freq: Every day | ORAL | 5 refills | Status: DC | PRN

## 2017-09-07 MED ORDER — SILDENAFIL CITRATE 100 MG PO TABS
100.0000 mg | ORAL_TABLET | Freq: Every day | ORAL | 5 refills | Status: DC | PRN
Start: 2017-09-07 — End: 2018-09-27

## 2017-09-08 ENCOUNTER — Encounter: Payer: Self-pay | Admitting: Cardiology

## 2017-09-19 NOTE — Progress Notes (Signed)
Cardiology Office Note   Date:  09/22/2017   ID:  Arthur [name], DOB [birth year], MRN [id number]  PCP:  Deneise Lever, MD  Cardiologist:   Rollene Rotunda, MD  Referring:  Daiva Eves, Lisette Grinder, MD   No chief complaint on file.    History of Present Illness: Arthur [name] is a 61 y.o. male who presents was referred by Daiva Eves, Lisette Grinder, MD for evaluation of CAD.  He was recently involved in a clinical trial for patients with HIV.  I don't know the details of the trial. However, seems like a randomization to statin vs placebo.  As part of this he had a cardiac CTA at [facility]. Is able to retrieve these results. I see from Dr. Clinton Gallant note that the patient had a very high radiation exposure with this study by I have not yet found those details.  He was found to have 50% LAD stenosis with "blooming artifact".  He has not otherwise had prior cardiac history. However, he's been quite fatigued. He has tiredness most of the time was just told that he had some REM disordered sleep as well. He does not describe chest pressure, neck or arm discomfort. However, he does get short of breath walking a moderate to mild distance on level ground. He'll be short of breath climbing a flight of stairs. He's not describing any PND or orthopnea. He's not having any palpitations, presyncope or syncope. Following this CT he was put on open label statin and aspirin and referred here.   Past Medical History:  Diagnosis Date  . Alcohol abuse 12/21/2016  . Bipolar disorder (HCC)   . Depression   . History of blood transfusion 12-12-1998  . History of radiation exposure 08/17/2017  . HTN (hypertension) 11/17/2016  . 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 Late syphilis   . Lower back pain 11/25/2015  . 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 TIA (transient ischemic attack) 11/17/2016    Past Surgical History:  Procedure Laterality Date  . broken right leg Right 2000  . GASTRIC  BYPASS  2012   [facility]     Current Outpatient Prescriptions  Medication Sig Dispense Refill  . ARIPiprazole (ABILIFY) 20 MG tablet Take 1 tablet (20 mg total) by mouth daily. For mood stability 30 tablet 0  . aspirin 81 MG tablet Take 1 tablet (81 mg total) by mouth daily. For heart health 1 tablet 0  . bictegravir-emtricitabine-tenofovir AF (BIKTARVY) 50-200-25 MG TABS tablet Take 1 tablet by mouth daily. 30 tablet 11  . Cholecalciferol (VITAMIN D) 2000 units CAPS Take 1 capsule by mouth daily.    . clonazePAM (KLONOPIN) 0.5 MG tablet Take 0.5 mg by mouth daily.    . Cyanocobalamin (VITAMIN B 12 PO) Take 1,000 mg by mouth every other day.    . docusate sodium (COLACE) 100 MG capsule Take 1 capsule (100 mg total) by mouth daily. (May purchase from over the counter at the pharmacy): For constipation 1 capsule 0  . DULoxetine (CYMBALTA) 60 MG capsule Take 2 capsules (120 mg total) by mouth daily. For depression 60 capsule 0  . hydrochlorothiazide (HYDRODIURIL) 25 MG tablet Take 1 tablet (25 mg total) by mouth daily. For high blood pressure 30 tablet 0  . hydrocortisone cream 1 % Apply topically 2 (two) times daily as needed for itching. 30 g 0  . mirtazapine (REMERON) 15 MG tablet Take 1 tablet (15 mg total) by mouth at bedtime. For depression/sleep 30  tablet 5  . prazosin (MINIPRESS) 2 MG capsule Take 2 mg by mouth at bedtime.    . rosuvastatin (CRESTOR) 10 MG tablet Take 1 tablet (10 mg total) by mouth at bedtime. For high cholesterol 30 tablet 0  . sildenafil (VIAGRA) 100 MG tablet Take 1 tablet (100 mg total) by mouth daily as needed for erectile dysfunction. 30 tablet 5  . tamsulosin (FLOMAX) 0.4 MG CAPS capsule Take 1 capsule (0.4 mg total) by mouth 2 (two) times daily. For prostate health 30 capsule 0  . topiramate (TOPAMAX) 50 MG tablet Take 50 mg by mouth daily.     No current facility-administered medications for this visit.     Allergies:   Patient has no known allergies.     Social History:  The patient  reports that he has never smoked. He has never used smokeless tobacco. He reports that he does not drink alcohol or use drugs.   Family History:  The patient's family history includes Heart disease in his father and sister; Heart disease (age of onset: 10) in his mother; Stroke in his mother.    ROS:  Please see the history of present illness.   Otherwise, review of systems are positive for none.   All other systems are reviewed and negative.    PHYSICAL EXAM: VS:  BP 126/64   Pulse 73   Ht 5' 9.5" (1.765 m)   Wt 196 lb 9.6 oz (89.2 kg)   BMI 28.62 kg/m  , BMI Body mass index is 28.62 kg/m. GENERAL:  Well appearing HEENT:  Pupils equal round and reactive, fundi not visualized, oral mucosa unremarkable NECK:  No jugular venous distention, waveform within normal limits, carotid upstroke brisk and symmetric, no bruits, no thyromegaly LYMPHATICS:  No cervical, inguinal adenopathy LUNGS:  Clear to auscultation bilaterally BACK:  No CVA tenderness CHEST:  Unremarkable HEART:  PMI not displaced or sustained,S1 and S2 within normal limits, no S3, no S4, no clicks, no rubs, no murmurs ABD:  Flat, positive bowel sounds normal in frequency in pitch, no bruits, no rebound, no guarding, no midline pulsatile mass, no hepatomegaly, no splenomegaly EXT:  2 plus pulses throughout, no edema, no cyanosis no clubbing SKIN:  No rashes no nodules NEURO:  Cranial nerves II through XII grossly intact, motor grossly intact throughout PSYCH:  Cognitively intact, oriented to person place and time    EKG:  EKG is ordered today. The ekg ordered today demonstrateSinus rhythm, rate 71, axis within normal limits, intervals within normal limits, premature atrial contraction.  Recent Labs: 08/02/2017: ALT 20; BUN 9; Creatinine, Ser 1.07; Hemoglobin 12.7; Platelets 230; Potassium 3.8; Sodium 133    Lipid Panel    Component Value Date/Time   CHOL 120 (L) 05/25/2016 0927    TRIG 52 05/25/2016 0927   HDL 63 05/25/2016 0927   CHOLHDL 1.9 05/25/2016 0927   VLDL 10 05/25/2016 0927   LDLCALC 47 05/25/2016 0927      Wt Readings from Last 3 Encounters:  09/20/17 196 lb 9.6 oz (89.2 kg)  08/28/17 185 lb (83.9 kg)  08/17/17 189 lb 4 oz (85.8 kg)      Other studies Reviewed: Additional studies/ records that were reviewed today include: [facility] records, [facility] records, labs. . Review of the above records demonstrates:  Please see elsewhere in the note.     ASSESSMENT AND PLAN:  CAD:  He does have at least nonobstructive disease with symptoms. To further assess the hemodynamic significance of this he  needs stress testing with imaging. However, given his excessive radiation exposure I would not want to order a perfusion study but rather will order an exercise echocardiogram. Further evaluation with based on these results.   FATIGUE:   I have taken the liberty of referring him for a sleep M.D. evaluation given his report at night terror and REM disorder sleep.  I would think that this was a likely cause of the fatigue.    Current medicines are reviewed at length with the patient today.  The patient does not have concerns regarding medicines.  The following changes have been made:  no change  Labs/ tests ordered today include:     Orders Placed This Encounter  Procedures  . Ambulatory referral to Neurology  . ECHOCARDIOGRAM STRESS TEST     Disposition:   FU with me in one year or sooner based on the stress test.      Signed, Rollene Rotunda, MD  09/22/2017 1:19 PM    [facility]

## 2017-09-20 ENCOUNTER — Ambulatory Visit (INDEPENDENT_AMBULATORY_CARE_PROVIDER_SITE_OTHER): Payer: Self-pay | Admitting: Cardiology

## 2017-09-20 ENCOUNTER — Encounter: Payer: Self-pay | Admitting: Cardiology

## 2017-09-20 ENCOUNTER — Encounter: Payer: Self-pay | Admitting: *Deleted

## 2017-09-20 VITALS — BP 126/64 | HR 73 | Ht 69.5 in | Wt 196.6 lb

## 2017-09-20 DIAGNOSIS — I2510 Atherosclerotic heart disease of native coronary artery without angina pectoris: Secondary | ICD-10-CM

## 2017-09-20 DIAGNOSIS — G479 Sleep disorder, unspecified: Secondary | ICD-10-CM

## 2017-09-20 NOTE — Patient Instructions (Addendum)
Medication Instructions:  Continue current Medications  If you need a refill on your cardiac medications before your next appointment, please call your pharmacy.  Labwork: None Ordered   Testing/Procedures: Your physician has requested that you have a stress echocardiogram. For further information please visit [email or web address] Please follow instruction sheet as given.   Follow-Up: You have been referred to Dr Porfirio Mylar Dohmeier  Your physician wants you to follow-up in: As Needed.    Thank you for choosing [facility] at [facility]!!

## 2017-09-22 ENCOUNTER — Ambulatory Visit (INDEPENDENT_AMBULATORY_CARE_PROVIDER_SITE_OTHER): Payer: Self-pay | Admitting: Internal Medicine

## 2017-09-22 ENCOUNTER — Encounter: Payer: Self-pay | Admitting: Internal Medicine

## 2017-09-22 ENCOUNTER — Ambulatory Visit: Payer: Self-pay

## 2017-09-22 VITALS — BP 133/85 | HR 63 | Temp 98.2°F | Ht 69.0 in | Wt 196.1 lb

## 2017-09-22 DIAGNOSIS — Z23 Encounter for immunization: Secondary | ICD-10-CM

## 2017-09-22 DIAGNOSIS — I10 Essential (primary) hypertension: Secondary | ICD-10-CM

## 2017-09-22 DIAGNOSIS — Z0000 Encounter for general adult medical examination without abnormal findings: Secondary | ICD-10-CM

## 2017-09-22 DIAGNOSIS — F1020 Alcohol dependence, uncomplicated: Secondary | ICD-10-CM

## 2017-09-22 DIAGNOSIS — F319 Bipolar disorder, unspecified: Secondary | ICD-10-CM

## 2017-09-22 NOTE — Assessment & Plan Note (Signed)
Patient says his mood is fine- denies mania- denies suicidal thoughts. He was seen by inpatient psychiatry but does not see outpatient psychiatry but is on multiple psychiatric medications. He goes to [facility] for management of his alcoholism.  Please see alcoholism H&P for discussion on klonopin  Plan -referred to psychiatry

## 2017-09-22 NOTE — Progress Notes (Signed)
Internal Medicine Clinic Attending  Case discussed with Dr. Saraiya at the time of the visit.  We reviewed the resident's history and exam and pertinent patient test results.  I agree with the assessment, diagnosis, and plan of care documented in the resident's note.  

## 2017-09-22 NOTE — Assessment & Plan Note (Signed)
-  Given flu vaccine -Pt had colonoscopy 7 years ago in [place] with recommendation to repeat 5 years- has not had subsequent one- placed referral -referred to dentist -referred to psychiatry

## 2017-09-22 NOTE — Assessment & Plan Note (Signed)
BP Readings from Last 3 Encounters:  09/22/17 (!) 136/110  09/20/17 126/64  08/29/17 (!) 123/92   Repeat blood pressure 133/83. He is compliant with hctz  Plan -continue hctz

## 2017-09-22 NOTE — Progress Notes (Signed)
    CC: alcohol use disorder, bipolar, HM, and HTN HPI: [name] is a 61 y.o.  Man with PMH noted below here for alcohol use disorder, bipolar, HM, and HTN  Please see Problem List/A&P for the status of the patient's chronic medical problems   Past Medical History:  Diagnosis Date  . Alcohol abuse 12/21/2016  . Bipolar disorder (HCC)   . Depression   . History of blood transfusion 12-12-1998  . History of radiation exposure 08/17/2017  . HTN (hypertension) 11/17/2016  . 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 Late syphilis   . Lower back pain 11/25/2015  . 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 TIA (transient ischemic attack) 11/17/2016    Review of Systems: Constitutional: Negative for fever, chills, weight loss and malaise/fatigue.  HEENT: No headaches, vision problems, cough, hearing problems  Respiratory: Negative for cough, shortness of breath and wheezing.  Cardiovascular: Negative for chest pain, orthopnea, or PND   Gastrointestinal: Negative for nausea, vomiting, abdominal pain, diarrhea  Musculoskeletal: Negative for myalgias, joint pain Neuro:  Has  Fallen 2-3 times   Physical Exam: Vitals:   09/22/17 1435  BP: (!) 136/110  --> 133/83  Pulse: (!) 111  Temp: 98.2 F (36.8 C)  TempSrc: Oral  SpO2: 100%  Weight: 196 lb 1.6 oz (89 kg)  Height:  (1.753 m)    General: A&O, in NAD HEENT: EOMI, sclera white, conjunctiva pink  Neck: supple, midline trachea CV: RRR, normal s1, s2, no m/r/g Resp: equal and symmetric breath sounds, no wheezing or crackles  Abdomen: soft, nontender, nondistended, +BS Skin: warm, dry, intact Extremities: pulses intact b/l, no edema  Neurologic exam: CN II-XII grossly intact DTRs: 2+ and symmetric. No hyperreflexia  Sensory: intact to light touch Motor: 5/5 strength in upper, 5/5 in lower extremities, normal muscle tone Cerebellar:  Ordinary gait normal but slow Abnormal tandem gait-  patient was significantly unsteady with this task Fine tremors on outstretched hands     Assessment & Plan:   See encounters tab for problem based medical decision making. Patient discussed with Dr. Oswaldo Done

## 2017-09-22 NOTE — Patient Instructions (Signed)
Thank you for your visit today Please follow up with the physical therapist- they will call to make appointment Please follow up with the GI doctor for colonoscopy It is important to follow up with psychiatrist- we have placed a referral and they will call you to make appointment Please follow up here in 6 months

## 2017-09-22 NOTE — Assessment & Plan Note (Addendum)
Patient presents here as a follow up visit. He recently self- checked in to a detoxification program and was at [facility] and then he was discharged just 3 days ago.  Currently [facility] 3 times a week and just was there this morning-  His complaint today was recurrent falls- He fell one time 10 days ago, and 2 times back in January. He says that he was drinking alcohol in January, but he was not drinking alcohol when he fell 10 days ago. He was feeling a little dizzy but no LOC, or seizures or incontinence. He felt like his knees gave out. He also takes a lot of psychiatric and beers criteria medications like remeron, cymbalta, minipress, flomax, topamax, abilify, which can increase risk of falling  He was started on klonopin while he was inpatient rehab and he was given 4 refills of 14 days by his psychiatrist. He has started taking this 3-4 days ago. Review of database indicated no prior benzodiazepine dispensation.   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   A: likely his falls are due to history of heavy alcohol use, as well as psychotropic medications  Plan -continue to follow with [facility] -referred to physical therapy for gait training -referred to psychiatry -follow up here in 6 months

## 2017-09-23 ENCOUNTER — Encounter: Payer: Self-pay | Admitting: Cardiology

## 2017-09-25 NOTE — Addendum Note (Signed)
Addended by: Beryle Quant on: 09/25/2017 02:26 PM   Modules accepted: Orders

## 2017-09-26 ENCOUNTER — Ambulatory Visit (INDEPENDENT_AMBULATORY_CARE_PROVIDER_SITE_OTHER): Payer: Self-pay | Admitting: Infectious Disease

## 2017-09-26 ENCOUNTER — Telehealth: Payer: Self-pay | Admitting: *Deleted

## 2017-09-26 ENCOUNTER — Encounter (INDEPENDENT_AMBULATORY_CARE_PROVIDER_SITE_OTHER): Payer: Self-pay | Admitting: *Deleted

## 2017-09-26 ENCOUNTER — Encounter: Payer: Self-pay | Admitting: Infectious Disease

## 2017-09-26 VITALS — BP 113/73 | HR 98 | Temp 98.0°F | Ht 69.5 in | Wt 192.0 lb

## 2017-09-26 DIAGNOSIS — Z006 Encounter for examination for normal comparison and control in clinical research program: Secondary | ICD-10-CM

## 2017-09-26 DIAGNOSIS — F333 Major depressive disorder, recurrent, severe with psychotic symptoms: Secondary | ICD-10-CM

## 2017-09-26 DIAGNOSIS — F1094 Alcohol use, unspecified with alcohol-induced mood disorder: Secondary | ICD-10-CM

## 2017-09-26 DIAGNOSIS — E162 Hypoglycemia, unspecified: Secondary | ICD-10-CM

## 2017-09-26 DIAGNOSIS — Z9884 Bariatric surgery status: Secondary | ICD-10-CM

## 2017-09-26 DIAGNOSIS — F319 Bipolar disorder, unspecified: Secondary | ICD-10-CM

## 2017-09-26 DIAGNOSIS — F332 Major depressive disorder, recurrent severe without psychotic features: Secondary | ICD-10-CM

## 2017-09-26 DIAGNOSIS — W19XXXA Unspecified fall, initial encounter: Secondary | ICD-10-CM

## 2017-09-26 DIAGNOSIS — Z87898 Personal history of other specified conditions: Secondary | ICD-10-CM

## 2017-09-26 DIAGNOSIS — F1011 Alcohol abuse, in remission: Secondary | ICD-10-CM

## 2017-09-26 DIAGNOSIS — A529 Late syphilis, unspecified: Secondary | ICD-10-CM

## 2017-09-26 DIAGNOSIS — F1010 Alcohol abuse, uncomplicated: Secondary | ICD-10-CM

## 2017-09-26 DIAGNOSIS — I25758 Atherosclerosis of native coronary artery of transplanted heart with other forms of angina pectoris: Secondary | ICD-10-CM

## 2017-09-26 DIAGNOSIS — B20 Human immunodeficiency virus [HIV] disease: Secondary | ICD-10-CM

## 2017-09-26 DIAGNOSIS — G479 Sleep disorder, unspecified: Secondary | ICD-10-CM

## 2017-09-26 NOTE — Telephone Encounter (Signed)
Pick up medication

## 2017-09-26 NOTE — Progress Notes (Signed)
Chief complaints: Arthur [name] is here for followup still dealing with significant depression along with several other issues including several falls  Subjective:    Patient ID: Arthur [name], male    DOB: Oct 26, 1956, 61 y.o.   MRN: [id number]  HPI  61 year old with HIV diagnosed a little more than one year ago.  He has history of bipolar disorder and alcohol abuse that had been in  remission.   He had been enrolled into  ACTG study and had been on  Dolutegravir and Lamivudine (dual therapy) and now changed over to  Tivicay and Descovy and now BIKTARVY.  He prefers his new STR to [place] and Descovy.   Lab Results  Component Value Date   HIV1RNAQUANT <20 NOT DETECTED 06/08/2017   HIV1RNAQUANT 38 (H) 11/09/2016   HIV1RNAQUANT 29 (H) 05/25/2016     Lab Results  Component Value Date   CD4TABS 1,090 11/09/2016   CD4TABS 960 05/25/2016   CD4TABS 930 12/15/2015   .  He did forget to renew ADAP but fortunately [facility] extended deadline for renewal into November. He will fill out paperwork today.  He had CAD dx by CTA as part of the REPRIEVE study.  (he was also exposed to higher than expected radiation during that scan--see my last research note)  He is seeing Dr. Antoine Poche for workup for this.  He has had several falls recently one when he was intoxicated but the other 2 not intoxicated.  He DID have problems with post-gastric bypass hypoglycemia previously but states that his BG have been normal.  He is engaged with [facility] and rx for depression, anxiety, alcholism and sleep disturbance now with improvement since starting lorazepam--which I thought would help him. He had been at [facility] recently as a patient.     Past Medical History:  Diagnosis Date  . Alcohol abuse 12/21/2016  . Bipolar disorder (HCC)   . Depression   . History of blood transfusion 12-12-1998  . History of radiation exposure 08/17/2017  . HTN (hypertension) 11/17/2016  . Hypoglycemia after GI (gastrointestinal) surgery  06/20/2016  . Late syphilis   . Lower back pain 11/25/2015  . 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 TIA (transient ischemic attack) 11/17/2016    Past Surgical History:  Procedure Laterality Date  . broken right leg Right 2000  . GASTRIC BYPASS  2012   [facility]    Family History  Problem Relation Age of Onset  . Heart disease Mother 71       MI age 47.  Died age 65  . Stroke Mother   . Heart disease Father        Died age 49s MI  . Heart disease Sister        No details      Social History   Social History  . Marital status: Single    Spouse name: N/A  . Number of children: N/A  . Years of education: N/A   Social History Main Topics  . Smoking status: Never Smoker  . Smokeless tobacco: Never Used  . Alcohol use No     Comment: no EtOH in 20 days 09/26/17  . Drug use: No  . Sexual activity: Not Currently    Partners: Male   Other Topics Concern  . None   Social History Narrative   Retired Child psychotherapist.    No Known Allergies   Current Outpatient Prescriptions:  .  ARIPiprazole (ABILIFY) 20 MG tablet, Take 1 tablet (20 mg total)  by mouth daily. For mood stability, Disp: 30 tablet, Rfl: 0 .  aspirin 81 MG tablet, Take 1 tablet (81 mg total) by mouth daily. For heart health, Disp: 1 tablet, Rfl: 0 .  bictegravir-emtricitabine-tenofovir AF (BIKTARVY) 50-200-25 MG TABS tablet, Take 1 tablet by mouth daily., Disp: 30 tablet, Rfl: 11 .  Cholecalciferol (VITAMIN D) 2000 units CAPS, Take 1 capsule by mouth daily., Disp: , Rfl:  .  clonazePAM (KLONOPIN) 0.5 MG tablet, Take 0.5 mg by mouth 3 times/day as needed-between meals & bedtime. , Disp: , Rfl:  .  Cyanocobalamin (VITAMIN B 12 PO), Take 1,000 mg by mouth every other day., Disp: , Rfl:  .  docusate sodium (COLACE) 100 MG capsule, Take 1 capsule (100 mg total) by mouth daily. (May purchase from over the counter at the pharmacy): For constipation, Disp: 1 capsule, Rfl: 0 .  DULoxetine (CYMBALTA) 60 MG  capsule, Take 2 capsules (120 mg total) by mouth daily. For depression, Disp: 60 capsule, Rfl: 0 .  hydrochlorothiazide (HYDRODIURIL) 25 MG tablet, Take 1 tablet (25 mg total) by mouth daily. For high blood pressure, Disp: 30 tablet, Rfl: 0 .  hydrocortisone cream 1 %, Apply topically 2 (two) times daily as needed for itching., Disp: 30 g, Rfl: 0 .  hydrOXYzine (ATARAX/VISTARIL) 50 MG tablet, Take 50 mg by mouth at bedtime., Disp: , Rfl:  .  mirtazapine (REMERON) 15 MG tablet, Take 1 tablet (15 mg total) by mouth at bedtime. For depression/sleep, Disp: 30 tablet, Rfl: 5 .  prazosin (MINIPRESS) 2 MG capsule, Take 2 mg by mouth at bedtime., Disp: , Rfl:  .  rosuvastatin (CRESTOR) 10 MG tablet, Take 1 tablet (10 mg total) by mouth at bedtime. For high cholesterol, Disp: 30 tablet, Rfl: 0 .  sildenafil (VIAGRA) 100 MG tablet, Take 1 tablet (100 mg total) by mouth daily as needed for erectile dysfunction., Disp: 30 tablet, Rfl: 5 .  tamsulosin (FLOMAX) 0.4 MG CAPS capsule, Take 1 capsule (0.4 mg total) by mouth 2 (two) times daily. For prostate health, Disp: 30 capsule, Rfl: 0 .  topiramate (TOPAMAX) 50 MG tablet, Take 50 mg by mouth daily., Disp: , Rfl:    Review of Systems  Constitutional: Negative for activity change, appetite change, diaphoresis, fatigue and unexpected weight change.  HENT: Negative for congestion, rhinorrhea, sinus pressure, sneezing, sore throat and trouble swallowing.   Eyes: Negative for photophobia and visual disturbance.  Respiratory: Negative for chest tightness, shortness of breath, wheezing and stridor.   Cardiovascular: Negative for chest pain, palpitations and leg swelling.  Gastrointestinal: Negative for abdominal distention, anal bleeding, blood in stool, constipation and nausea.  Genitourinary: Negative for difficulty urinating, discharge, dysuria, flank pain and hematuria.  Musculoskeletal: Negative for back pain, gait problem and joint swelling.  Skin: Negative  for color change, pallor, rash and wound.  Neurological: Negative for dizziness, tremors, weakness and light-headedness.  Hematological: Negative for adenopathy. Does not bruise/bleed easily.  Psychiatric/Behavioral: Positive for dysphoric mood. Negative for agitation, behavioral problems, confusion, decreased concentration, self-injury and suicidal ideas.       Objective:   Physical Exam  Constitutional: He is oriented to person, place, and time. He appears well-developed and well-nourished. No distress.  HENT:  Head: Normocephalic and atraumatic.  Mouth/Throat: Oropharynx is clear and moist. No oropharyngeal exudate.  Eyes: Pupils are equal, round, and reactive to light. Conjunctivae and EOM are normal. No scleral icterus.  Neck: Normal range of motion. Neck supple. No JVD present.  Cardiovascular: Normal  rate and regular rhythm.   Pulmonary/Chest: Effort normal. No respiratory distress. He has no wheezes.  Abdominal: Soft. Bowel sounds are normal. He exhibits no distension. There is no tenderness.  Musculoskeletal: Normal range of motion. He exhibits no edema or tenderness.       Left hip: Normal.       Lumbar back: He exhibits normal range of motion, no tenderness and no bony tenderness.  Lymphadenopathy:    He has no cervical adenopathy.  Neurological: He is alert and oriented to person, place, and time. He has normal reflexes. He exhibits normal muscle tone. Coordination normal.  Skin: Skin is warm and dry. No rash noted. He is not diaphoretic. No erythema. No pallor.  Psychiatric: His behavior is normal. Judgment and thought content normal. He exhibits a depressed mood.          Assessment & Plan:    HIV disease: continue  BIKTARVY and rewew ADAP  Depression with bipolar disorder and also hx of alcoholism. He is engaged with [facility], psychiatry and was eager to meet with Cordelia Pen today as well.  Post gastric bypass surgery hypoglycemia: he needs to adhere to strict diet and  take his acarbose  CAD: undergoing workup with Dr. Antoine Poche  I spent greater than 25 minutes with the patient including greater than 50% of time in face to face counsel of the patient re his new HIV regimen, his depression, alcoholism, his falls and in coordination of his care.

## 2017-09-26 NOTE — Progress Notes (Signed)
Arthur [name] is here for followup in the Reprieve study. He is currently off study drug and taking crestor. He finally got in to see Cardiology this week and has a stress echocardiogram scheduled. He also has been seen by Internal medicine and has some referrals made. He says his rehab went well and he will be followed 3 days a week at [facility]. He has been noticing some gait/balance issues recently and has fallen twice. He is to return for study in February.

## 2017-09-27 ENCOUNTER — Ambulatory Visit (INDEPENDENT_AMBULATORY_CARE_PROVIDER_SITE_OTHER): Payer: Self-pay | Admitting: Licensed Clinical Social Worker

## 2017-09-27 ENCOUNTER — Ambulatory Visit: Payer: Self-pay

## 2017-09-27 DIAGNOSIS — F1021 Alcohol dependence, in remission: Secondary | ICD-10-CM

## 2017-09-28 ENCOUNTER — Telehealth (HOSPITAL_COMMUNITY): Payer: Self-pay | Admitting: *Deleted

## 2017-09-28 ENCOUNTER — Telehealth: Payer: Self-pay

## 2017-09-28 ENCOUNTER — Encounter: Payer: Self-pay | Admitting: Infectious Disease

## 2017-09-28 NOTE — Telephone Encounter (Signed)
Left message on voicemail in reference to upcoming appointment scheduled for 10/04/17. Phone number given for a call back so details instructions can be given. Arthur DolinSharon S Brooks

## 2017-09-28 NOTE — Telephone Encounter (Signed)
Patient left handicap placard form for completion.   I called patient and left message :  He will need primary care or physical therapist  to complete this form since they are the ones working with his unsteadiness.  We can not determine if he qualifies or not.   We will keep form and can fax to different office if needed.   Laurell Josephsammy K King, RN

## 2017-10-03 ENCOUNTER — Ambulatory Visit: Payer: Self-pay | Admitting: Licensed Clinical Social Worker
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
## 2017-10-04 ENCOUNTER — Other Ambulatory Visit: Payer: Self-pay

## 2017-10-04 ENCOUNTER — Telehealth: Payer: Self-pay | Admitting: Cardiology

## 2017-10-04 ENCOUNTER — Telehealth: Payer: Self-pay | Admitting: Licensed Clinical Social Worker

## 2017-10-04 ENCOUNTER — Emergency Department (HOSPITAL_COMMUNITY): Payer: Self-pay

## 2017-10-04 ENCOUNTER — Ambulatory Visit (HOSPITAL_COMMUNITY): Payer: No Typology Code available for payment source | Attending: Cardiovascular Disease

## 2017-10-04 ENCOUNTER — Encounter (HOSPITAL_COMMUNITY): Payer: Self-pay

## 2017-10-04 ENCOUNTER — Ambulatory Visit (HOSPITAL_COMMUNITY): Payer: No Typology Code available for payment source

## 2017-10-04 ENCOUNTER — Emergency Department (HOSPITAL_COMMUNITY)
Admission: EM | Admit: 2017-10-04 | Discharge: 2017-10-04 | Disposition: A | Discharge status: Home / Self Care | Payer: Self-pay | Attending: Emergency Medicine | Admitting: Emergency Medicine

## 2017-10-04 ENCOUNTER — Encounter (HOSPITAL_COMMUNITY): Payer: Self-pay | Admitting: *Deleted

## 2017-10-04 DIAGNOSIS — Z7982 Long term (current) use of aspirin: Secondary | ICD-10-CM | POA: Insufficient documentation

## 2017-10-04 DIAGNOSIS — I951 Orthostatic hypotension: Secondary | ICD-10-CM

## 2017-10-04 DIAGNOSIS — I10 Essential (primary) hypertension: Secondary | ICD-10-CM | POA: Diagnosis present

## 2017-10-04 DIAGNOSIS — I480 Paroxysmal atrial fibrillation: Secondary | ICD-10-CM | POA: Insufficient documentation

## 2017-10-04 DIAGNOSIS — I4819 Other persistent atrial fibrillation: Secondary | ICD-10-CM | POA: Diagnosis present

## 2017-10-04 DIAGNOSIS — Z79899 Other long term (current) drug therapy: Secondary | ICD-10-CM | POA: Insufficient documentation

## 2017-10-04 DIAGNOSIS — F332 Major depressive disorder, recurrent severe without psychotic features: Secondary | ICD-10-CM | POA: Diagnosis present

## 2017-10-04 DIAGNOSIS — R0609 Other forms of dyspnea: Secondary | ICD-10-CM | POA: Diagnosis present

## 2017-10-04 HISTORY — DX: Orthostatic hypotension: I95.1

## 2017-10-04 LAB — BASIC METABOLIC PANEL
ANION GAP: 4 — AB (ref 5–15)
BUN: 10 mg/dL (ref 6–20)
CALCIUM: 8.5 mg/dL — AB (ref 8.9–10.3)
CO2: 23 mmol/L (ref 22–32)
CREATININE: 1.11 mg/dL (ref 0.61–1.24)
Chloride: 110 mmol/L (ref 101–111)
GFR calc Af Amer: >60 mL/min (ref >60)
GLUCOSE: 94 mg/dL (ref 65–99)
Potassium: 4.3 mmol/L (ref 3.5–5.1)
Sodium: 137 mmol/L (ref 135–145)

## 2017-10-04 LAB — CBC
HCT: 40 % (ref 39.0–52.0)
HEMOGLOBIN: 13.6 g/dL (ref 13.0–17.0)
MCH: 32.9 pg (ref 26.0–34.0)
MCHC: 34 g/dL (ref 30.0–36.0)
MCV: 96.9 fL (ref 78.0–100.0)
PLATELETS: 211 10*3/uL (ref 150–400)
RBC: 4.13 MIL/uL — ABNORMAL LOW (ref 4.22–5.81)
RDW: 13.6 % (ref 11.5–15.5)
WBC: 6.4 10*3/uL (ref 4.0–10.5)

## 2017-10-04 LAB — TSH: TSH: 1.317 u[IU]/mL (ref 0.350–4.500)

## 2017-10-04 LAB — MAGNESIUM: MAGNESIUM: 2.1 mg/dL (ref 1.7–2.4)

## 2017-10-04 LAB — I-STAT TROPONIN, ED: TROPONIN I, POC: 0 ng/mL (ref 0.00–0.08)

## 2017-10-04 MED ORDER — METOPROLOL TARTRATE 50 MG PO TABS: 25.0000 mg | ORAL_TABLET | Freq: Two times a day (BID) | ORAL | 1 refills | Status: DC

## 2017-10-04 NOTE — Telephone Encounter (Signed)
Patient presented to office today for stress echo and was in atrial fibrillation with RVR at 140bpm.  He says that sometimes he can feel the palpitations but does not today.  He has had several falls in the past month but no syncope.  He is not anticoagulation.  Stress echo cancelled. I have recommended admission for treatment of afib with RVR. Patient agrees.

## 2017-10-04 NOTE — ED Provider Notes (Addendum)
[facility] EMERGENCY DEPARTMENT Provider Note   CSN: [id number] Arrival date & time: 10/04/17  1609     History   Chief Complaint Chief Complaint  Patient presents with  . Atrial Fibrillation    HPI Arthur [name] is a 61 y.o. male.  HPI, 61 year old male who presents with new onset atrial fibrillation. History history of hypertension, hyperlipidemia, well-controlled HIV, and history of alcohol abuse (28 days sober). Sent from cardiovascular center today with new onset atrial fibrillation. He had a scheduled stress test, and prior to the exam was noted to have atrial fibrillation with RVR and a heart rate of 140s. He states that he has had 3 episodes of atrial fibrillation in the past, primarily seen when he was in rehabilitation for mental health purposes. When he had followed up with cardiology, he was not in atrial fibrillation, he states. Reports that he has had episodes of palpitations and feeling very tired when he is in atrial fibrillation. Denies any chest pain, difficulty breathing, lower extremity swelling, calf tenderness, orthopnea or PND. States that yesterday he did have an episode where he passed out. States that he was getting up from the couch when he felt palpitations, extreme fatigue, and the next thing he knew he woke up on the ground. It was unwitnessed, and he is unsure of how long he was passed out for. He thinks he may have been in atrial fibrillation yesterday when this happened. No nausea, vomiting, diarrhea, fevers or recent infections.  Past Medical History:  Diagnosis Date  . Alcohol abuse 12/21/2016  . Bipolar disorder (HCC)   . Depression   . History of blood transfusion 12-12-1998  . History of radiation exposure 08/17/2017  . HTN (hypertension) 11/17/2016  . 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 Late syphilis   . Lower back pain 11/25/2015  . 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 TIA (transient  ischemic attack) 11/17/2016    Patient Active Problem List   Diagnosis Date Noted  . Paroxysmal atrial fibrillation (HCC) 10/04/2017  . Syncope due to orthostatic hypotension 10/04/2017  . Healthcare maintenance 09/22/2017  . History of radiation exposure 08/17/2017  . Alcohol use disorder, severe, dependence (HCC) 08/03/2017  . 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 Alcohol abuse 12/21/2016  . 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 TIA (transient ischemic attack) 11/17/2016  . Essential hypertension 11/17/2016  . 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 Alcoholism (HCC) 06/20/2016  . Hypoglycemia 04/05/2016  . Diarrhea 11/25/2015  . Flank pain 11/25/2015  . Lower back pain 11/25/2015  . Loss of weight 11/17/2015  . Late syphilis   . HIV disease (HCC) 11/12/2014  . Bipolar 1 disorder (HCC) 05/15/2012  . 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 Elevated fasting blood sugar 05/15/2012  . H/O surgical procedure 05/15/2012  . Lung mass 05/15/2012  . History of surgical procedure 05/15/2012    Past Surgical History:  Procedure Laterality Date  . broken right leg Right 2000  . GASTRIC BYPASS  2012   [facility]       Home Medications    Prior to Admission medications   Medication Sig Start Date End Date Taking? Authorizing Provider  ARIPiprazole (ABILIFY) 15 MG tablet Take 15 mg by mouth daily.   Yes [provider]  aspirin EC 325 MG tablet Take 325 mg by mouth daily.   Yes [provider]  bictegravir-emtricitabine-tenofovir AF (BIKTARVY)  50-200-25 MG TABS tablet Take 1 tablet by mouth daily. 08/21/17  Yes Daiva EvesVan Dam, Lisette Grinderornelius N, MD  Cholecalciferol (VITAMIN D) 2000 units CAPS Take 1 capsule by mouth daily.   Yes [provider]  clonazePAM (KLONOPIN) 0.5 MG tablet Take 0.5 mg by mouth 3 times/day as needed-between meals & bedtime.    Yes [provider]  Cyanocobalamin (VITAMIN B 12 PO) Take 1,000 mg by mouth every other day.   Yes [provider]  docusate sodium (COLACE) 100 MG capsule Take 1 capsule (100 mg total) by mouth daily. (May purchase from over the counter at the pharmacy): For constipation Patient taking differently: Take 100 mg by mouth daily as needed for mild constipation. (May purchase from over the counter at the pharmacy): For constipation 08/14/17  Yes Nwoko, Nicole KindredAgnes I, NP  DULoxetine (CYMBALTA) 60 MG capsule Take 2 capsules (120 mg total) by mouth daily. For depression 08/14/17  Yes Armandina StammerNwoko, Agnes I, NP  ferrous sulfate 325 (65 FE) MG EC tablet Take 325 mg by mouth daily.   Yes [provider]  hydrocortisone cream 1 % Apply topically 2 (two) times daily as needed for itching. 08/13/17  Yes Armandina StammerNwoko, Agnes I, NP  hydrOXYzine (ATARAX/VISTARIL) 50 MG tablet Take 50 mg by mouth at bedtime.   Yes [provider]  mirtazapine (REMERON) 15 MG tablet Take 1 tablet (15 mg total) by mouth at bedtime. For depression/sleep 08/31/17  Yes Daiva EvesVan Dam, Lisette Grinderornelius N, MD  prazosin (MINIPRESS) 2 MG capsule Take 2 mg by mouth at bedtime.   Yes [provider]  rosuvastatin (CRESTOR) 10 MG tablet Take 1 tablet (10 mg total) by mouth at bedtime. For high cholesterol 08/13/17  Yes Armandina StammerNwoko, Agnes I, NP  sildenafil (VIAGRA) 100 MG tablet Take 1 tablet (100 mg total) by mouth daily as needed for erectile dysfunction. 09/07/17  Yes Judyann MunsonSnider, Cynthia, MD  tamsulosin (FLOMAX) 0.4 MG CAPS capsule Take 1 capsule (0.4 mg total) by mouth 2 (two) times daily. For prostate health 08/13/17  Yes Armandina StammerNwoko, Agnes I, NP  topiramate (TOPAMAX) 50 MG tablet Take 50 mg by mouth daily.   Yes [provider]  ARIPiprazole (ABILIFY) 20 MG tablet Take 1 tablet (20 mg total) by mouth daily. For mood stability Patient not taking: Reported on 10/04/2017 08/14/17   Armandina StammerNwoko, Agnes I, NP  aspirin 81 MG tablet Take 1 tablet (81 mg total) by mouth daily.  For heart health Patient not taking: Reported on 10/04/2017 08/13/17   Armandina StammerNwoko, Agnes I, NP  metoprolol tartrate (LOPRESSOR) 50 MG tablet Take 0.5 tablets (25 mg total) by mouth 2 (two) times daily. 10/04/17   Lavera GuiseLiu, Meili Kleckley Duo, MD    Family History Family History  Problem Relation Age of Onset  . Heart disease Mother [id number]       MI age 62.  Died age 62  . Stroke Mother   . Heart disease Father        Died age 4370s MI  . Heart disease Sister        No details    Social History Social History  Substance Use Topics  . Smoking status: Never Smoker  . Smokeless tobacco: Never Used  . Alcohol use No     Comment: no EtOH in 20 days 09/26/17     Allergies   Patient has no known allergies.   Review of Systems Review of Systems  Constitutional: Negative for fever.  Respiratory: Negative for shortness of breath.  Cardiovascular: Positive for palpitations. Negative for chest pain.  Allergic/Immunologic: Positive for immunocompromised state.  Neurological: Negative for headaches.  Hematological: Does not bruise/bleed easily.  All other systems reviewed and are negative.    Physical Exam Updated Vital Signs BP 129/85   Pulse 100   Temp 98.4 F (36.9 C) (Oral)   Resp 17   Ht 5\' 10"  (1.778 m)   Wt 87.1 kg (192 lb)   SpO2 99%   BMI 27.55 kg/m   Physical Exam Physical Exam  Nursing note and vitals reviewed. Constitutional: Well developed, well nourished, non-toxic, and in no acute distress Head: Normocephalic and atraumatic.  Mouth/Throat: Oropharynx is clear and moist.  Neck: Normal range of motion. Neck supple.  Cardiovascular: Normal rate and irregularly irregular rhythm.  No edema.  Pulmonary/Chest: Effort normal and breath sounds normal.  Abdominal: Soft. There is no tenderness. There is no rebound and no guarding.  Musculoskeletal: Normal range of motion. No calf tenderness. Neurological: Alert, no facial droop, fluent speech, moves all extremities symmetrically Skin:  Skin is warm and dry.  Psychiatric: Cooperative   ED Treatments / Results  Labs (all labs ordered are listed, but only abnormal results are displayed) Labs Reviewed  BASIC METABOLIC PANEL - Abnormal; Notable for the following:       Result Value   Calcium 8.5 (*)    Anion gap 4 (*)    All other components within normal limits  CBC - Abnormal; Notable for the following:    RBC 4.13 (*)    All other components within normal limits  TSH  MAGNESIUM  I-STAT TROPONIN, ED    EKG  EKG Interpretation  Date/Time:  Wednesday October 04 2017 16:13:00 EDT Ventricular Rate:  78 PR Interval:    QRS Duration: 85 QT Interval:  365 QTC Calculation: 416 R Axis:   28 Text Interpretation:  Atrial fibrillation Ventricular premature complex new onset atrial flutter Confirmed by Crista Curb [phone]) on 10/04/2017 5:07:26 PM       Radiology Dg Chest 2 View  Result Date: 10/04/2017 CLINICAL DATA:  Atrial fibrillation. Fatigue. Fall yesterday with possible syncope. EXAM: CHEST  2 VIEW COMPARISON:  08/03/2017 FINDINGS: The heart size and mediastinal contours are within normal limits. Both lungs are clear. The visualized skeletal structures are unremarkable. IMPRESSION: No active cardiopulmonary disease. Electronically Signed   By: Gaylyn Rong M.D.   On: 10/04/2017 17:03    Procedures Procedures (including critical care time)  Medications Ordered in ED Medications - No data to display   Initial Impression / Assessment and Plan / ED Course  I have reviewed the triage vital signs and the nursing notes.  Pertinent labs & imaging results that were available during my care of the patient were reviewed by me and considered in my medical decision making (see chart for details).     New on set atrial fibrillation. In Afib in ED, but rate controlled in ED without medications. There are no major electrolyte or metabolic derangements on blood work. He has been asymptomatic since heart rate has  been in the 70s to 90s here. He has Chads2vasc score of 1. Normally under these circumstances would discharge home with follow-up in afib clinic. However, with syncopal episode yesterday, inclined to admit for syncopal work-up. Discussed with Dr. Herbie Baltimore, who came to ED to see patient.   Patient was inclined to have outpatient work-up of his syncope and afib. His syncopal episode yesterday felt to be more orthostatic in nature in setting  of afib and taking prazosin and HCTZ. Dr. Herbie Baltimore recommends outpatient work-up as well. He will arrange outpatient stress echo, formal echo, and carotid ultrasounds per patient. He recommended discontinuation of hydrochlorothiazide and starting metoprolol 25 mg twice a day. Patient will take an extra dose if he has palpitations. He will also arrange event monitor 30 days for patient. At this time, he recommended that he continue ASA. If significant afib burden may start anticoagulation in the office. Strict return and follow-up instructions reviewed. He expressed understanding of all discharge instructions and felt comfortable with the plan of care.             Final Clinical Impressions(s) / ED Diagnoses   Final diagnoses:  Paroxysmal atrial fibrillation (HCC)    New Prescriptions New Prescriptions   METOPROLOL TARTRATE (LOPRESSOR) 50 MG TABLET    Take 0.5 tablets (25 mg total) by mouth 2 (two) times daily.     Lavera Guise, MD 10/04/17 Nolen Mu    Lavera Guise, MD 10/04/17 [phone]

## 2017-10-04 NOTE — Consult Note (Signed)
Cardiology Consultation:   Patient ID: Arthur [name]; [id number]; 05-18-1956   Admit date: 10/04/2017 Date of Consult: 10/04/2017  Primary Care Provider: Deneise Lever, MD Primary Cardiologist: Hochrein Primary Electrophysiologist:  N/a  Referring MD: Dr. Verdie Mosher (EDP)   Patient Profile:   Arthur [name] is a 61 y.o. male with a hx of Moderate CAD by CTA, at least 3 episodes of "fall / syncope", documented PAF (while admitted to [facility])  who is being seen today for the evaluation of Afib & Syncope at the request of Dr. Verdie Mosher from [facility].  History of Present Illness:   Arthur [name] is a very pleasant gentleman referred to Dr. Antoine Poche by Dr. 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 Dr. Antoine Poche noted that the patient had been complaining of episodes of doing fatigue and tired. There was some concern was sleep disorder. He denies any PND or orthopnea but did note exertional dyspnea going up stairs. Nothing on level ground. Dr. Antoine Poche scheduled him to have a Treadmill Stress Echocardiogram for which he presented today. As a caveat, apparently he has been told he has had atrial fibrillation on at least 3 different occasions, the most recent being during her hospitalization at [facility] a few weeks ago.    Yesterday, he felt little bit tired, was sitting in a chair and got up to go to the kitchen. Mayes about 4 steps and collapse/passed out. He was only out for a few seconds. He denied any sensation of tightness or pressure associated with it but does think you remember lobe of irregular heartbeat at that time. He didn't think much of it, as he has had at least 3 other episodes have not been evaluated. He denied any TIA or amaurosis fugax of symptoms. He denied any real prodrome besides feeling a little bit tired and the irregular heartbeats. Once he came to, he felt relatively fine. He  denies any antecedent illnesses such as fevers chills, cold sweats or nausea or vomiting. He indicates eating and drinking well. He has no nausea symptoms from his HIV medications.  When he went to have a stress test today, he felt very tired and fatigued while waiting in the waiting room. When they finally called him to go have his stress test, the first with EKG on him and noted him to be in atrial fibrillation with rapid rate. They contacted Dr. Mayford Knife, who is the [facility] office DOD who recommended that he be taken to the emergency room for A. fib RVR. Upon arrival to the emergency room he was in A. fib, but a symptomatically rates in the 80s to 90s. We were called because of the episode of syncope and this newly documented A. Fib. (Apparently, the documentation of A. fib at [facility] was not easily available).  Last saw him, he was having no symptoms. He did not notice that he was in atrial fibrillation. He was not complaining any shortness of breath. He had not had shortness breath today. He described his episode yesterday as essentially feeling a fluttering sensation and tired while sitting in a chair. When he got up to walk he passed out. Of note, he does take Minipress (for PTSD), Flomax and HCTZ.   Past Medical History:  Diagnosis Date  . Alcohol abuse 12/21/2016  . Bipolar disorder (HCC)   . Depression   . History of blood transfusion 12-12-1998  . History  of radiation exposure 08/17/2017  . HTN (hypertension) 11/17/2016  . 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 Late syphilis   . Lower back pain 11/25/2015  . 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 TIA (transient ischemic attack) 11/17/2016    Past Surgical History:  Procedure Laterality Date  . broken right leg Right 2000  . GASTRIC BYPASS  2012   [facility]     Home Medications:  Prior to Admission medications   Medication Sig Start Date End Date Taking? Authorizing Provider    ARIPiprazole (ABILIFY) 15 MG tablet Take 15 mg by mouth daily.   Yes [provider]  aspirin EC 325 MG tablet Take 325 mg by mouth daily.   Yes [provider]  bictegravir-emtricitabine-tenofovir AF (BIKTARVY) 50-200-25 MG TABS tablet Take 1 tablet by mouth daily. 08/21/17  Yes Daiva Eves, Lisette Grinder, MD  Cholecalciferol (VITAMIN D) 2000 units CAPS Take 1 capsule by mouth daily.   Yes [provider]  clonazePAM (KLONOPIN) 0.5 MG tablet Take 0.5 mg by mouth 3 times/day as needed-between meals & bedtime.    Yes [provider]  Cyanocobalamin (VITAMIN B 12 PO) Take 1,000 mg by mouth every other day.   Yes [provider]  docusate sodium (COLACE) 100 MG capsule Take 1 capsule (100 mg total) by mouth daily. (May purchase from over the counter at the pharmacy): For constipation Patient taking differently: Take 100 mg by mouth daily as needed for mild constipation. (May purchase from over the counter at the pharmacy): For constipation 08/14/17  Yes Nwoko, Nicole Kindred I, NP  DULoxetine (CYMBALTA) 60 MG capsule Take 2 capsules (120 mg total) by mouth daily. For depression 08/14/17  Yes Armandina Stammer I, NP  ferrous sulfate 325 (65 FE) MG EC tablet Take 325 mg by mouth daily.   Yes [provider]  hydrocortisone cream 1 % Apply topically 2 (two) times daily as needed for itching. 08/13/17  Yes Armandina Stammer I, NP  hydrOXYzine (ATARAX/VISTARIL) 50 MG tablet Take 50 mg by mouth at bedtime.   Yes [provider]  mirtazapine (REMERON) 15 MG tablet Take 1 tablet (15 mg total) by mouth at bedtime. For depression/sleep 08/31/17  Yes Daiva Eves, Lisette Grinder, MD  prazosin (MINIPRESS) 2 MG capsule Take 2 mg by mouth at bedtime.   Yes [provider]  rosuvastatin (CRESTOR) 10 MG tablet Take 1 tablet (10 mg total) by mouth at bedtime. For high cholesterol 08/13/17  Yes Armandina Stammer I, NP  sildenafil (VIAGRA) 100 MG tablet Take 1 tablet (100 mg total) by mouth daily  as needed for erectile dysfunction. 09/07/17  Yes Judyann Munson, MD  tamsulosin (FLOMAX) 0.4 MG CAPS capsule Take 1 capsule (0.4 mg total) by mouth 2 (two) times daily. For prostate health 08/13/17  Yes Armandina Stammer I, NP  topiramate (TOPAMAX) 50 MG tablet Take 50 mg by mouth daily.   Yes [provider]  ARIPiprazole (ABILIFY) 20 MG tablet Take 1 tablet (20 mg total) by mouth daily. For mood stability Patient not taking: Reported on 10/04/2017 08/14/17   Armandina Stammer I, NP  aspirin 81 MG tablet Take 1 tablet (81 mg total) by mouth daily. For heart health Patient not taking: Reported on 10/04/2017 08/13/17   Armandina Stammer I, NP  metoprolol tartrate (LOPRESSOR) 50 MG tablet Take 0.5 tablets (25 mg total) by mouth 2 (two) times daily. 10/04/17   Lavera Guise, MD    Inpatient Medications: None  PRN Meds:  n/a  Allergies:   No Known Allergies  Social History:   Social History   Social History  . Marital status: Single    Spouse name: N/A  . Number of children: N/A  . Years of education: N/A   Occupational History  . Not on file.   Social History Main Topics  . Smoking status: Never Smoker  . Smokeless tobacco: Never Used  . Alcohol use No     Comment: no EtOH in 20 days 09/26/17  . Drug use: No  . Sexual activity: Not Currently    Partners: Male   Other Topics Concern  . Not on file   Social History Narrative   Retired Child psychotherapist.    Family History:    Family History  Problem Relation Age of Onset  . Heart disease Mother 59       MI age 47.  Died age 3  . Stroke Mother   . Heart disease Father        Died age 2s MI  . Heart disease Sister        No details     ROS:  Please see the history of present illness.  Review of Systems  Constitution: Positive for weakness and malaise/fatigue. Negative for decreased appetite, diaphoresis and night sweats.       Symptoms noted while in A. Fib - for instance this morning while waiting for his stress test  HENT:  Negative for congestion, hoarse voice and nosebleeds.   Eyes: Negative.  Negative for blurred vision.  Cardiovascular: Positive for dyspnea on exertion (When in A. fib), palpitations and syncope. Negative for chest pain and claudication.  Respiratory: Negative for cough, shortness of breath and sleep disturbances due to breathing.   Skin: Negative.   Musculoskeletal: Negative for neck pain and stiffness.  Gastrointestinal: Negative for bloating, anorexia, diarrhea, hematemesis, hematochezia, melena, nausea and vomiting.  Genitourinary:       Difficulty with urination  Neurological: Negative for focal weakness, light-headedness, loss of balance, numbness and paresthesias.  Psychiatric/Behavioral: Negative for altered mental status, suicidal ideas and thoughts of violence.  All other systems reviewed and are negative.      Physical Exam/Data:   Vitals:   10/04/17 1800 10/04/17 1830 10/04/17 1900 10/04/17 1930  BP: (!) 117/97 122/82 (!) 120/98 129/85  Pulse: 84 76 (!) 50 100  Resp: 15 17 15 17   Temp:      TempSrc:      SpO2: 100% 99% 100% 99%  Weight:      Height:       No intake or output data in the 24 hours ending 10/04/17 2012 Filed Weights   10/04/17 1615  Weight: 192 lb (87.1 kg)   Body mass index is 27.55 kg/m.  General:  Well nourished, well developed, in no acute distress; well groomed.  HEENT: [place]/ACT, EOMI, MMM, Neck: no JVD, or carotid bruit. No thyromegaly. Cardiac:  normal S1, S2; RRR; no M/R/G Lungs:  clear to auscultation bilaterally, no wheezing, rhonchi or rales  Abd: soft, nontender, no hepatomegaly  Ext: no edema Musculoskeletal:  No deformities, BUE and BLE strength normal and equal Skin: warm and dry  Neuro:  CNs 2-12 intact, no focal abnormalities noted Psych:  Pleasant mood and affect  EKG:  The EKG was personally reviewed and demonstrates:  Atrial fibrillation, rate 70 bpm. PVC versus aberrantly conducted beat. Otherwise normal. Telemetry:   Telemetry was personally reviewed and demonstrates:  Atrial fibrillation with rates ranging from  70s to 90s.  Relevant CV Studies:  Cardiac CTA 06/12/2017: Calcium score 314 Agatson units.  Normal left main. LAD 40-50% noncalcified plaque at the origin. Diagonal branch is small without abnormality. Left circumflex, with OM branch normal. RCA diminutive but no lesions. LPDA is normal.  Laboratory Data:  Chemistry  Recent Labs Lab 10/04/17 1621  NA 137  K 4.3  CL 110  CO2 23  GLUCOSE 94  BUN 10  CREATININE 1.11  CALCIUM 8.5*  GFRNONAA >60  GFRAA >60  ANIONGAP 4*    No results for input(s): PROT, ALBUMIN, AST, ALT, ALKPHOS, BILITOT in the last 168 hours. Hematology  Recent Labs Lab 10/04/17 1621  WBC 6.4  RBC 4.13*  HGB 13.6  HCT 40.0  MCV 96.9  MCH 32.9  MCHC 34.0  RDW 13.6  PLT 211   Cardiac EnzymesNo results for input(s): TROPONINI in the last 168 hours.   Recent Labs Lab 10/04/17 1630  TROPIPOC 0.00    BNPNo results for input(s): BNP, PROBNP in the last 168 hours.  DDimer No results for input(s): DDIMER in the last 168 hours.  Radiology/Studies:  Dg Chest 2 View  Result Date: 10/04/2017 CLINICAL DATA:  Atrial fibrillation. Fatigue. Fall yesterday with possible syncope. EXAM: CHEST  2 VIEW COMPARISON:  08/03/2017 FINDINGS: The heart size and mediastinal contours are within normal limits. Both lungs are clear. The visualized skeletal structures are unremarkable. IMPRESSION: No active cardiopulmonary disease. Electronically Signed   By: Gaylyn RongWalter  Liebkemann M.D.   On: 10/04/2017 17:03    Assessment and Plan:   Principal Problem:   Syncope due to orthostatic hypotension Active Problems:   Paroxysmal atrial fibrillation (HCC)   Exertional dyspnea   Essential hypertension   Major depressive disorder, recurrent severe without psychotic features (HCC)  Gerri SporeWesley is a very pleasant gentleman who is now clearly has diagnosis of atrial fibrillation which is quite  likely paroxysmal/persistent A. fib since he has been at most the day. He is no longer in RVR. He is not currently on any rate control agents. With regular rate, he seems to be relatively asymptomatic, however when his rates Higher he does get somewhat tired, fatigued may be a bit short of breath and clearly orthostatic. I suspect his syncope episode is a combination of several things: The presence of A. fib making his responsiveness to orthostasis inadequate, as of Minipress and likely mild dehydration with HCTZ.  It  is not usually the case to have some pass out simply because of A. Fib.  We talked about different options for evaluation, we both came to the agreement that we can do most of his evaluation in the outpatient setting and avoid an admission to the hospital, especially since he is not feeling poorly at this time.  My recommendations are as follows:  DC HCTZ  Start metoprolol 25 mg twice a day. He can use an additional dose when necessary for rapid heartbeats, or take half dose if he feels lightheaded and not tachycardic  Encourage adequate hydration and eating.  If at all possible, it would be nice to potentially consider using a different medicine besides Minipress  With accommodation of 30 day monitor and a stress test, we will be determine his full CHA2DS2Vasc Score - currently the only point we go for hypertension. - At present score V1, however the possibility of nonobstructive coronary disease could make it 2.  Studies recommended:  30 day event monitor to determine the burden of A. fib, and is rates.  Also for syncope.  Will try to reorder his stress echocardiogram along with a full echocardiogram, however this should be done when he is in sinus rhythm and not A. Fib  We will order carotid Dopplers mostly to evaluate vertebral flow.  He is can be safely discharged from the emergency room with the change in medications.    For questions or updates, please contact [facility] Please consult [email or web address] for contact info under Cardiology/STEMI.   Signed, Bryan Lemma, MD  10/04/2017 8:12 PM

## 2017-10-04 NOTE — Progress Notes (Unsigned)
[name] arrived for Echo Stress treadmill, at a rate of 144 in Atrial Fibrillation with RVR.  Dr. Mayford Knifeurner (DOD) was consulted who [facility] Rate control and medication management.  Due to patients recent history of falls, EMS was contacted and used for transportation.  Patient is stable upon leaving with EMS, EKG and patient demographics provided.    Farrel ConnersBethany Shaniqua Guillot, RDCS

## 2017-10-04 NOTE — ED Notes (Signed)
Paged cards/HARDING

## 2017-10-04 NOTE — Telephone Encounter (Signed)
[facility] and left available appointments and asked to call back with preferred date.  Vergia AlbertsSherry Damiean Lukes, [facility]

## 2017-10-04 NOTE — Discharge Instructions (Signed)
STOP taking hydrochlorothiazide.   Start taking metoprolol 25 mg two times daily for your atrial fibrillation. If you have palpitations, take an extra dose.  Cardiology will set up ultrasound of your heart and neck and set you up with close follow-up appointment.  Return for worsening symptoms, including recurrent passing out, confusion, chest pain, difficulty breathing or any other symptoms concerning to you.

## 2017-10-04 NOTE — ED Triage Notes (Signed)
Pt arrives EMS from cards office where he had an appointment for stress test. Stress test was not completed d/t pt being in AFIB rate 120-140 when placed on monitor. Pt has hx of afib once before but is not on medications. PT denies having any chest pain, sob. Endorses being exhausted since waking up this morning. Pt reports he had a fall yesterday after "blacking out for a few minutes". He reports 4 episodes this month. A&Ox 4. NAD

## 2017-10-04 NOTE — Telephone Encounter (Signed)
Patient called and left message regarding appointment stating that he would not be able to attend today due to [facility].  Patient stated that he wanted to reschedule the appointment.  Vergia AlbertsSherry Makyle Eslick, [facility]

## 2017-10-05 ENCOUNTER — Telehealth: Payer: Self-pay | Admitting: *Deleted

## 2017-10-05 DIAGNOSIS — R55 Syncope and collapse: Secondary | ICD-10-CM

## 2017-10-05 DIAGNOSIS — I4891 Unspecified atrial fibrillation: Secondary | ICD-10-CM

## 2017-10-05 NOTE — Telephone Encounter (Signed)
Carotid and Event Monitor was order and send to scheduler, an order was already in for stress echo will advised scheduler to schedule that also..[name]

## 2017-10-05 NOTE — Telephone Encounter (Signed)
-----   Message from Ellsworth LennoxBrittany M Strader, [place] sent at 10/04/2017  8:22 PM EDT ----- Regarding: Follow-Up Testing Hi ,   Dr. Herbie BaltimoreHarding saw this patient of Hochrein's in the ED and sent him home. Basically, he was noted to be in atrial fibrillation with RVR at the time of his stress echo and was sent directly to the ED from the office.   Herbie BaltimoreHarding would like for him to have an:   Event Monitor to assess for recurrent atrial fibrillation Carotid Dopplers for syncope Needs his stress echocardiogram to be rescheduled  I did not place the orders as Herbie BaltimoreHarding said he wanted them ordered under Hochrein so the results go to him. If you need to put any orders under my name, feel free to and I can review them and forward to [facility] if needed.   Thank you for your help!  Best,  [place]renada[place]ittany

## 2017-10-10 ENCOUNTER — Other Ambulatory Visit: Payer: Self-pay | Admitting: Infectious Disease

## 2017-10-10 ENCOUNTER — Ambulatory Visit (HOSPITAL_COMMUNITY)
Admission: RE | Admit: 2017-10-10 | Discharge: 2017-10-10 | Disposition: A | Discharge status: Home / Self Care | Payer: Self-pay | Source: Ambulatory Visit | Attending: Nurse Practitioner | Admitting: Nurse Practitioner

## 2017-10-10 ENCOUNTER — Telehealth: Payer: Self-pay | Admitting: *Deleted

## 2017-10-10 ENCOUNTER — Encounter (HOSPITAL_COMMUNITY): Payer: Self-pay | Admitting: Nurse Practitioner

## 2017-10-10 VITALS — BP 104/74 | HR 61 | Ht 70.0 in | Wt 191.0 lb

## 2017-10-10 DIAGNOSIS — Z9884 Bariatric surgery status: Secondary | ICD-10-CM | POA: Insufficient documentation

## 2017-10-10 DIAGNOSIS — F319 Bipolar disorder, unspecified: Secondary | ICD-10-CM | POA: Insufficient documentation

## 2017-10-10 DIAGNOSIS — Z823 Family history of stroke: Secondary | ICD-10-CM | POA: Insufficient documentation

## 2017-10-10 DIAGNOSIS — Z8249 Family history of ischemic heart disease and other diseases of the circulatory system: Secondary | ICD-10-CM | POA: Insufficient documentation

## 2017-10-10 DIAGNOSIS — Z79899 Other long term (current) drug therapy: Secondary | ICD-10-CM | POA: Insufficient documentation

## 2017-10-10 DIAGNOSIS — Z9889 Other specified postprocedural states: Secondary | ICD-10-CM | POA: Insufficient documentation

## 2017-10-10 DIAGNOSIS — I10 Essential (primary) hypertension: Secondary | ICD-10-CM | POA: Insufficient documentation

## 2017-10-10 DIAGNOSIS — Z7982 Long term (current) use of aspirin: Secondary | ICD-10-CM | POA: Insufficient documentation

## 2017-10-10 DIAGNOSIS — Z8673 Personal history of transient ischemic attack (TIA), and cerebral infarction without residual deficits: Secondary | ICD-10-CM | POA: Insufficient documentation

## 2017-10-10 DIAGNOSIS — R55 Syncope and collapse: Secondary | ICD-10-CM | POA: Insufficient documentation

## 2017-10-10 DIAGNOSIS — B20 Human immunodeficiency virus [HIV] disease: Secondary | ICD-10-CM | POA: Insufficient documentation

## 2017-10-10 DIAGNOSIS — I480 Paroxysmal atrial fibrillation: Secondary | ICD-10-CM | POA: Insufficient documentation

## 2017-10-10 DIAGNOSIS — F1011 Alcohol abuse, in remission: Secondary | ICD-10-CM | POA: Insufficient documentation

## 2017-10-10 NOTE — Progress Notes (Signed)
Primary Care Physician: Deneise LeverSaraiya, Parth, MD Referring Physician: [facility] f/u Cardiologist: Dr. Anselm PancoastHochrein   Arthur [name] is a 61 y.o. male with a h/o HTN, depression, gastric bypass 5-6 years ago  with 100 + lb loss.alcohol abuse,(40 days sober), HIV +, that was in the ER with an episode of afib, syncope, 10/24. Stood up to go to kitchen and collapsed/passed out. He did not seek attention for it at the time. He presented for his stress test and was in afib. Dr. Mayford Knifeurner was contacted and advised for him to go to ER. He was rate controlled in the  ER and it was decided w/u would be on outpatient  basis.  He was recently seen byDr. Antoine PocheHochrein 10/10, by Dr. Daiva EvesVan Dam (Infectious Disease - follows his HIV) for an abnormal cardiac CTA performed as part of a screening evaluation for a clincal trail[facility] Dr. Antoine PocheHochrein noted that the patient had been complaining of episodes of doing fatigue and tired. There was some concern was sleep disorder. He denies any PND or orthopnea but did note exertional dyspnea going up stairs. Nothing on level ground.Dr. Antoine PocheHochrein scheduled him to have a Treadmill Stress Echocardiogram, monitor and sleep study. He is now pending carotid u/s for syncope.   As a caveat, apparently he has been told he has had atrial fibrillation on at least 3 different occasions, the most recent being during his hospitalization at [facility] several  weeks ago for alcohol abuse.  With syncopal spell, it was thought 2/2 to be orthostatic in nature  from  afib with RVR and HCTZ. This was stopped and he was started on metoprolol for afib rate control. He is in SR today and his stress test has been rescheduled for 11/14. Chads vasc score is 1 for HTN, possible 2 for non obstructive CAD but Dr. Herbie BaltimoreHarding mentioned to wait until results of stress test and echo to further define his chads vasc score and make the decision if DOAC's are needed..  Today, he denies symptoms of palpitations, chest pain,  shortness of breath, orthopnea, PND, lower extremity edema, dizziness, presyncope, syncope, or neurologic sequela. The patient is tolerating medications without difficulties and is otherwise without complaint today.   Past Medical History:  Diagnosis Date  . Alcohol abuse 12/21/2016  . Bipolar disorder (HCC)   . Depression   . History of blood transfusion 12-12-1998  . History of radiation exposure 08/17/2017  . HTN (hypertension) 11/17/2016  . 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 Late syphilis   . Lower back pain 11/25/2015  . 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 TIA (transient ischemic attack) 11/17/2016   Past Surgical History:  Procedure Laterality Date  . broken right leg Right 2000  . [facility]IC BYPASS  2012   [facility]    Current Outpatient Prescriptions  Medication Sig Dispense Refill  . ARIPiprazole (ABILIFY) 15 MG tablet Take 15 mg by mouth daily.    [name]. aspirin EC 325 MG tablet Take 325 mg by mouth daily.    . bictegravir-emtricitabine-tenofovir AF (BIKTARVY) 50-200-25 MG TABS tablet Take 1 tablet by mouth daily. 30 tablet 11  . Cholecalciferol (VITAMIN D) 2000 units CAPS Take 1 capsule by mouth daily.    . clonazePAM (KLONOPIN) 0.5 MG tablet Take 0.5 mg by mouth 3 times/day as needed-between meals & bedtime.     . Cyanocobalamin (VITAMIN B 12 PO) Take 1,000 mg by mouth every other day.    . docusate sodium (COLACE) 100 MG  capsule Take 1 capsule (100 mg total) by mouth daily. (May purchase from over the counter at the pharmacy): For constipation (Patient taking differently: Take 100 mg by mouth daily as needed for mild constipation. (May purchase from over the counter at the pharmacy): For constipation) 1 capsule 0  . DULoxetine (CYMBALTA) 60 MG capsule Take 2 capsules (120 mg total) by mouth daily. For depression 60 capsule 0  . ferrous sulfate 325 (65 FE) MG EC tablet Take 325 mg by mouth daily.    . hydrocortisone cream 1 % Apply topically 2  (two) times daily as needed for itching. 30 g 0  . hydrOXYzine (ATARAX/VISTARIL) 50 MG tablet Take 50 mg by mouth at bedtime.    . metoprolol tartrate (LOPRESSOR) 50 MG tablet Take 0.5 tablets (25 mg total) by mouth 2 (two) times daily. 60 tablet 1  . mirtazapine (REMERON) 15 MG tablet Take 1 tablet (15 mg total) by mouth at bedtime. For depression/sleep 30 tablet 5  . prazosin (MINIPRESS) 2 MG capsule Take 2 mg by mouth at bedtime.    . rosuvastatin (CRESTOR) 10 MG tablet Take 1 tablet (10 mg total) by mouth at bedtime. For high cholesterol 30 tablet 0  . sildenafil (VIAGRA) 100 MG tablet Take 1 tablet (100 mg total) by mouth daily as needed for erectile dysfunction. 30 tablet 5  . tamsulosin (FLOMAX) 0.4 MG CAPS capsule Take 1 capsule (0.4 mg total) by mouth 2 (two) times daily. For prostate health 30 capsule 0  . topiramate (TOPAMAX) 50 MG tablet Take 50 mg by mouth daily.     No current facility-administered medications for this encounter.     No Known Allergies  Social History   Social History  . Marital status: Single    Spouse name: N/A  . Number of children: N/A  . Years of education: N/A   Occupational History  . Not on file.   Social History Main Topics  . Smoking status: Never Smoker  . Smokeless tobacco: Never Used  . Alcohol use No     Comment: no EtOH in 20 days 09/26/17  . Drug use: No  . Sexual activity: Not Currently    Partners: Male   Other Topics Concern  . Not on file   Social History Narrative   Retired Child psychotherapist.    Family History  Problem Relation Age of Onset  . Heart disease Mother 7       MI age 30.  Died age 74  . Stroke Mother   . Heart disease Father        Died age 70s MI  . Heart disease Sister        No details    ROS- All systems are reviewed and negative except as per the HPI above  Physical Exam: Vitals:   10/10/17 0857  BP: 104/74  Pulse: 61  Weight: 191 lb (86.6 kg)  Height: 5\' 10"  (1.778 m)   Wt Readings from  Last 3 Encounters:  10/10/17 191 lb (86.6 kg)  10/04/17 192 lb (87.1 kg)  09/26/17 192 lb (87.1 kg)    Labs: Lab Results  Component Value Date   NA 137 10/04/2017   K 4.3 10/04/2017   CL 110 10/04/2017   CO2 23 10/04/2017   GLUCOSE 94 10/04/2017   BUN 10 10/04/2017   CREATININE 1.11 10/04/2017   CALCIUM 8.5 (L) 10/04/2017   PHOS 3.1 11/16/2015   MG 2.1 10/04/2017   Lab Results  Component Value  Date   INR 1.03 05/31/2016   Lab Results  Component Value Date   CHOL 120 (L) 05/25/2016   HDL 63 05/25/2016   LDLCALC 47 05/25/2016   TRIG 52 05/25/2016     GEN- The patient is well appearing, alert and oriented x 3 today.   Head- normocephalic, atraumatic Eyes-  Sclera clear, conjunctiva pink Ears- hearing intact Oropharynx- clear Neck- supple, no JVP Lymph- no cervical lymphadenopathy Lungs- Clear to ausculation bilaterally, normal work of breathing Heart- Regular rate and rhythm, no murmurs, rubs or gallops, PMI not laterally displaced GI- soft, NT, ND, + BS Extremities- no clubbing, cyanosis, or edema MS- no significant deformity or atrophy Skin- no rash or lesion Psych- euthymic mood, full affect Neuro- strength and sensation are intact  EKG- NSR at 61 bpm, Qrs int 78 ms, qtc 412 ms    Assessment and Plan: 1. Paroxysmal afib General education re afib Continue metoprolol at 1/2 tab bid Triggers  also discussed  Chadsvasc score of 1, possible 2 with report of nonobstructive CAD on CT scan, currently pending stress test and echo, moniotr and sleep study In history, noted TIA in 2017, pt pt denies prior stroke or TIA So for  now, will leave on baby asa for score of 1, but this will need to be reevaluated  when the test results come in. Stress test has been rescheduled to 11/14  2. Syncope Thought secondary to orthostatic changes  on HCTZ and demands of afib Now off on hctz Reminded to stay hydrated  F/u with Dr. Antoine Poche probably  to be decided re results of  tests afib clinic as needed  Lupita Leash C. Matthew Folks [facility] [address] [place], [place] [address] [phone]

## 2017-10-10 NOTE — Telephone Encounter (Signed)
SPOKE WITH JESSICA AT [facility] REGARDING THE STATUS OF THIS REFERRAL. OFFICE TO REACH OUT TO PATIENT TO GET HIM SCHEDULED.

## 2017-10-16 ENCOUNTER — Ambulatory Visit (HOSPITAL_COMMUNITY)
Admission: RE | Admit: 2017-10-16 | Discharge: 2017-10-16 | Disposition: A | Discharge status: Home / Self Care | Payer: No Typology Code available for payment source | Source: Ambulatory Visit | Attending: Cardiovascular Disease | Admitting: Cardiovascular Disease

## 2017-10-16 DIAGNOSIS — R55 Syncope and collapse: Secondary | ICD-10-CM | POA: Insufficient documentation

## 2017-10-16 DIAGNOSIS — I6523 Occlusion and stenosis of bilateral carotid arteries: Secondary | ICD-10-CM | POA: Insufficient documentation

## 2017-10-16 DIAGNOSIS — I10 Essential (primary) hypertension: Secondary | ICD-10-CM | POA: Insufficient documentation

## 2017-10-16 DIAGNOSIS — Z8673 Personal history of transient ischemic attack (TIA), and cerebral infarction without residual deficits: Secondary | ICD-10-CM | POA: Insufficient documentation

## 2017-10-16 DIAGNOSIS — I4891 Unspecified atrial fibrillation: Secondary | ICD-10-CM | POA: Insufficient documentation

## 2017-10-17 ENCOUNTER — Telehealth (HOSPITAL_COMMUNITY): Payer: Self-pay | Admitting: *Deleted

## 2017-10-17 NOTE — Telephone Encounter (Signed)
Patient given detailed instructions per Stress Test Requisition Sheet for test on 10/25/17 at 230.Patient Notified to arrive 30 minutes early, and that it is imperative to arrive on time for appointment to keep from having the test rescheduled.  Patient verbalized understanding. Daneil DolinSharon S Brooks

## 2017-10-18 ENCOUNTER — Encounter: Payer: Self-pay | Admitting: Physical Therapy

## 2017-10-18 ENCOUNTER — Ambulatory Visit
Payer: No Typology Code available for payment source | Attending: Student in an Organized Health Care Education/Training Program | Admitting: Physical Therapy

## 2017-10-18 ENCOUNTER — Encounter: Payer: Self-pay | Admitting: *Deleted

## 2017-10-18 DIAGNOSIS — M6281 Muscle weakness (generalized): Secondary | ICD-10-CM | POA: Insufficient documentation

## 2017-10-18 DIAGNOSIS — R2681 Unsteadiness on feet: Secondary | ICD-10-CM | POA: Insufficient documentation

## 2017-10-18 DIAGNOSIS — R42 Dizziness and giddiness: Secondary | ICD-10-CM | POA: Insufficient documentation

## 2017-10-18 DIAGNOSIS — R296 Repeated falls: Secondary | ICD-10-CM

## 2017-10-18 DIAGNOSIS — R2689 Other abnormalities of gait and mobility: Secondary | ICD-10-CM | POA: Insufficient documentation

## 2017-10-18 NOTE — Therapy (Signed)
[facility] [address] [place], [place], [address] Phone: [phone]   Fax:  [phone]  Physical Therapy Evaluation  Patient Details  Name: Arthur [name] MRN: [id number] Date of Birth: 12/21/1955 Referring Provider: Deneise Lever, MD   Encounter Date: 10/18/2017  PT End of Session - 10/18/17 1945    Visit Number  1    Number of Visits  17    Authorization Type  [employer] waiting to determine if covers PT    PT Start Time  1400    PT Stop Time  1445    PT Time Calculation (min)  45 min    Equipment Utilized During Treatment  Gait belt    Activity Tolerance  Patient tolerated treatment well    Behavior During Therapy  [facility] for tasks assessed/performed       Past Medical History:  Diagnosis Date   Alcohol abuse 12/21/2016   Bipolar disorder (HCC)    Depression    History of blood transfusion 12-12-1998   History of radiation exposure 08/17/2017   HTN (hypertension) 11/17/2016   Hypoglycemia after GI (gastrointestinal) surgery 06/20/2016   Late syphilis    Lower back pain 11/25/2015   Recurrent major depression-severe (HCC) 06/20/2016   TIA (transient ischemic attack) 11/17/2016    Past Surgical History:  Procedure Laterality Date   broken right leg Right 2000   GASTRIC BYPASS  2012   [facility]    There were no vitals filed for this visit.   Subjective Assessment - 10/18/17 1408    Subjective  This 61yo was referred to PT on 09/22/17 for evaluation with diagnosis of Alcoholism (F10.20) with gait abnormality by  Deneise Lever, MD. He reports recurrent falls with some dizziness.    Pertinent History  alcohol use disorder, bipolar, HM, HTN, late syphilis, TIA, LBP,     Limitations  Walking;Writing    Patient Stated Goals  To improve balance to stop falling or loosing balance    Currently in Pain?  No/denies         [facility] PT Assessment - 10/18/17 1400      Assessment   Medical Diagnosis  alcoholism,  recurrent falls    Referring Provider  Deneise Lever, MD    Onset Date/Surgical Date  09/22/17 MD referral   MD referral   Hand Dominance  Right    Prior Therapy  none      Precautions   Precautions  Fall      Balance Screen   Has the patient fallen in the past 6 months  Yes    How many times?  8 a lot to side or forward, blacks out   a lot to side or forward, blacks out   Has the patient had a decrease in activity level because of a fear of falling?   Yes    Is the patient reluctant to leave their home because of a fear of falling?   Yes      Home Environment   Living Environment  Private residence    Living Arrangements  Alone    Type of Home  Apartment    Home Access  Level entry curb cut at parking lot   curb cut at parking lot   Home Layout  One level first floor   first floor   Home Equipment  Klahr - single point;Tub bench;Grab bars - tub/shower;Grab bars - toilet      Prior Function   Level of Independence  Independent;Independent  with household mobility without device;Independent with community mobility without device      Coordination   Fine Motor Movements are Fluid and Coordinated  Yes    Finger Nose Finger Test  WNL    Heel Shin Test  WNL      Posture/Postural Control   Posture/Postural Control  Postural limitations    Postural Limitations  Rounded Shoulders;Forward head      ROM / Strength   AROM / PROM / Strength  AROM;Strength      AROM   Overall AROM   Within functional limits for tasks performed      Strength   Overall Strength  Deficits    Strength Assessment Site  Hip;Knee;Ankle    Right/Left Hip  Right;Left    Right Hip Flexion  5/5    Right Hip Extension  4/5    Right Hip ABduction  4/5    Left Hip Flexion  5/5    Left Hip Extension  4/5    Left Hip ABduction  4/5    Right/Left Knee  Right;Left    Right Knee Flexion  4+/5    Right Knee Extension  5/5    Left Knee Flexion  4/5    Left Knee Extension  4+/5    Right/Left Ankle  Right;Left     Right Ankle Dorsiflexion  5/5    Right Ankle Plantar Flexion  4/5    Left Ankle Dorsiflexion  4+/5    Left Ankle Plantar Flexion  3/5      Transfers   Transfers  Sit to Stand;Stand to Sit    Sit to Stand  5: Supervision;Without upper extremity assist;From chair/3-in-1    Stand to Sit  5: Supervision;Without upper extremity assist;To chair/3-in-1      Ambulation/Gait   Ambulation/Gait  Yes    Ambulation/Gait Assistance  5: Supervision    Ambulation/Gait Assistance Details  Excessive UE weight bearing    Ambulation Distance (Feet)  200 Feet    Assistive device  Straight cane    Gait Pattern  Step-through pattern;Decreased arm swing - left;Decreased stance time - left;Left circumduction;Trunk flexed;Wide base of support    Ambulation Surface  Indoor;Level    Gait velocity  2.55    Stairs  Yes    Stairs Assistance  5: Supervision    Stair Management Technique  One rail Right;With cane;Step to pattern;Forwards    Number of Stairs  4      Standardized Balance Assessment   Standardized Balance Assessment  Berg Balance Test;Timed Up and Go Test      Berg Balance Test   Sit to Stand  Able to stand without using hands and stabilize independently    Standing Unsupported  Able to stand safely 2 minutes    Sitting with Back Unsupported but Feet Supported on Floor or Stool  Able to sit safely and securely 2 minutes    Stand to Sit  Sits safely with minimal use of hands    Transfers  Able to transfer safely, minor use of hands    Standing Unsupported with Eyes Closed  Able to stand 10 seconds with supervision    Standing Ubsupported with Feet Together  Able to place feet together independently and stand for 1 minute with supervision    From Standing, Reach Forward with Outstretched Arm  Can reach forward >12 cm safely (5")    From Standing Position, Pick up Object from Floor  Able to pick up shoe, needs supervision  From Standing Position, Turn to Look Behind Over each Shoulder  Looks  behind one side only/other side shows less weight shift    Turn 360 Degrees  Needs close supervision or verbal cueing    Standing Unsupported, Alternately Place Feet on Step/Stool  Able to complete 4 steps without aid or supervision    Standing Unsupported, One Foot in Front  Able to take small step independently and hold 30 seconds    Standing on One Leg  Unable to try or needs assist to prevent fall    Total Score  40      Timed Up and Go Test   Normal TUG (seconds)  9.79 with cane   with cane     Functional Gait  Assessment   Gait assessed   Yes    Gait Level Surface  Walks 20 ft, slow speed, abnormal gait pattern, evidence for imbalance or deviates 10-15 in outside of the 12 in walkway width. Requires more than 7 sec to ambulate 20 ft.    Change in Gait Speed  Cannot change speeds, deviates greater than 15 in outside 12 in walkway width, or loses balance and has to reach for wall or be caught.    Gait with Horizontal Head Turns  Performs head turns with moderate changes in gait velocity, slows down, deviates 10-15 in outside 12 in walkway width but recovers, can continue to walk.    Gait with Vertical Head Turns  Performs task with severe disruption of gait (eg, staggers 15 in outside 12 in walkway width, loses balance, stops, reaches for wall).    Gait and Pivot Turn  Turns slowly, requires verbal cueing, or requires several small steps to catch balance following turn and stop    Step Over Obstacle  Is able to step over one shoe box (4.5 in total height) but must slow down and adjust steps to clear box safely. May require verbal cueing.    Gait with Narrow Base of Support  Ambulates less than 4 steps heel to toe or cannot perform without assistance.    Gait with Eyes Closed  Cannot walk 20 ft without assistance, severe gait deviations or imbalance, deviates greater than 15 in outside 12 in walkway width or will not attempt task.    Ambulating Backwards  Walks 20 ft, slow speed, abnormal  gait pattern, evidence for imbalance, deviates 10-15 in outside 12 in walkway width.    Steps  Two feet to a stair, must use rail.    Total Score  5         Vestibular Assessment - 10/18/17 1400      Occulomotor Exam   Occulomotor Alignment  Normal    Smooth Pursuits  Intact    Saccades  Intact         Objective measurements completed on examination: See above findings.                PT Short Term Goals - 10/18/17 1957      PT SHORT TERM GOAL #1   Title  Patient demonstrates understanding of intial HEP. (All STGs Target Date: 11/15/2017)    Time  4    Period  Weeks    Status  New    Target Date  11/15/17      PT SHORT TERM GOAL #2   Title  Berg Balance >45/56    Time  4    Period  Weeks    Status  New  Target Date  11/15/17      PT SHORT TERM GOAL #3   Title  Patient ambulates with cane 500' iwth supervision.     Time  4    Period  Weeks    Status  New    Target Date  11/15/17        PT Long Term Goals - 10/18/17 1954      PT LONG TERM GOAL #1   Title  Patient verbalizes & demonstrates understanding of ongoing HEP / fitness plan.  (All LTGs Target Date: 12/16/2017)    Time  8    Period  Weeks    Status  New    Target Date  12/16/16      PT LONG TERM GOAL #2   Title  Berg Balance >48/56     Time  8    Period  Weeks    Status  New    Target Date  12/16/16      PT LONG TERM GOAL #3   Title  Functional Gait Assessment >/= 19/30    Time  8    Period  Weeks    Status  New    Target Date  12/16/16      PT LONG TERM GOAL #4   Title  Patient ambulates 1000' outdoor surfaces including grass, ramps, curbs with cane or less modified independently.     Time  8    Period  Weeks    Status  New    Target Date  12/16/16             Plan - 10/18/17 1950    Clinical Impression Statement  This 61yo was referred to PT on 09/22/17 for evaluation with diagnosis of Alcoholism (F10.20) with gait abnormality by  Deneise LeverParth Saraiya, MD. He reports  recurrent falls with some dizziness. He has weakness in LEs impairing mobility. He purchased cane for gait & is unknowledgeable in proper use. Patients gait is impaired with deficits indicating fall risk. Functional Gait Assessment 5/30 indicates high fall risk. Berg Balance 40/56 indicates high fall risk also. Patient would benefit from skilled PT to improve mobility & safety.     History and Personal Factors relevant to plan of care:  lives alone, recurrent falls, Hx of alcholism, TIA, late syphilis    Clinical Presentation  Stable    Clinical Decision Making  Moderate    Rehab Potential  Good    PT Frequency  2x / week    PT Duration  8 weeks    PT Treatment/Interventions  ADLs/Self Care Home Management;DME Instruction;Gait training;Stair training;Functional mobility training;Therapeutic activities;Therapeutic exercise;Balance training;Neuromuscular re-education;Cognitive remediation;Patient/family education    PT Next Visit Plan  HEP for strength & balance    Consulted and Agree with Plan of Care  Patient       Patient will benefit from skilled therapeutic intervention in order to improve the following deficits and impairments:  Abnormal gait, Decreased activity tolerance, Decreased balance, Decreased endurance, Decreased mobility, Decreased strength, Dizziness, Postural dysfunction  Visit Diagnosis: Other abnormalities of gait and mobility  Unsteadiness on feet  Repeated falls  Dizziness and giddiness  Muscle weakness (generalized)     Problem List Patient Active Problem List   Diagnosis Date Noted   Paroxysmal atrial fibrillation (HCC) 10/04/2017   Syncope due to orthostatic hypotension 10/04/2017   Exertional dyspnea 10/04/2017   Healthcare maintenance 09/22/2017   History of radiation exposure 08/17/2017   Alcohol use disorder, severe, dependence (HCC) 08/03/2017  Major depressive disorder, recurrent severe without psychotic features (HCC) 08/03/2017    Alcohol abuse 12/21/2016   Alcohol-induced mood disorder (HCC) 12/20/2016   TIA (transient ischemic attack) 11/17/2016   Essential hypertension 11/17/2016   Hypoglycemia after GI (gastrointestinal) surgery 06/20/2016   Alcoholism (HCC) 06/20/2016   Hypoglycemia 04/05/2016   Diarrhea 11/25/2015   Flank pain 11/25/2015   Lower back pain 11/25/2015   Loss of weight 11/17/2015   Late syphilis    HIV disease (HCC) 11/12/2014   Bipolar 1 disorder (HCC) 05/15/2012   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 [place], [place], [address] Phone: [phone]   Fax:  [phone]  Name: Arthur [name] MRN: [id number] Date of Birth: June 28, 1956

## 2017-10-18 NOTE — Progress Notes (Unsigned)
Patient ID: Arthur [name], male   DOB: 12-26-1955, 61 y.o.   MRN: [id number] Patient enrolled for [employer] to ship a 30 day cardiac event monitor to his home pending approval of the [employer] hardship application.  Application and any supporting documentation (incomplete) faxed to [employer] for consideration.

## 2017-10-20 ENCOUNTER — Telehealth: Payer: Self-pay | Admitting: Cardiology

## 2017-10-20 NOTE — Telephone Encounter (Signed)
Notes recorded by Rollene RotundaHochrein, James, MD on 10/16/2017 at 9:38 PM EST Mild carotid plaque. No follow up indicated at this point. Call [name] with the results and send results to Deneise LeverSaraiya, Parth, MD

## 2017-10-20 NOTE — Telephone Encounter (Signed)
LM that results will be released to MyChart 

## 2017-10-20 NOTE — Telephone Encounter (Signed)
New message ° °Pt verbalized that he is returning call for the rn  °

## 2017-10-25 ENCOUNTER — Ambulatory Visit (HOSPITAL_COMMUNITY): Payer: No Typology Code available for payment source

## 2017-10-25 ENCOUNTER — Ambulatory Visit (HOSPITAL_COMMUNITY): Payer: No Typology Code available for payment source | Attending: Cardiology

## 2017-10-25 ENCOUNTER — Encounter (HOSPITAL_COMMUNITY): Payer: Self-pay | Admitting: *Deleted

## 2017-10-25 ENCOUNTER — Encounter (HOSPITAL_COMMUNITY): Payer: Self-pay

## 2017-10-25 NOTE — Progress Notes (Unsigned)
Did not do exercise Stress Echocardiogram due to patient report on PT and patient statement that he did not feel safe walking on treadmill. Went to DOD (Dr. Mayford Knifeurner) she says he should be changed to a Lexiscan.

## 2017-10-30 ENCOUNTER — Encounter: Payer: Self-pay | Admitting: Gastroenterology

## 2017-10-31 ENCOUNTER — Telehealth: Payer: Self-pay | Admitting: *Deleted

## 2017-10-31 ENCOUNTER — Other Ambulatory Visit: Payer: Self-pay

## 2017-10-31 DIAGNOSIS — I517 Cardiomegaly: Secondary | ICD-10-CM

## 2017-10-31 NOTE — Telephone Encounter (Signed)
Lexiscan myo ordered and send to scheduler to be schedule..[name]

## 2017-10-31 NOTE — Progress Notes (Signed)
OK to order Lexiscan Myoview.  

## 2017-10-31 NOTE — Progress Notes (Signed)
Lexiscan-message sent to Dr Antoine PocheHochrein to order

## 2017-11-02 ENCOUNTER — Other Ambulatory Visit: Payer: Self-pay | Admitting: Infectious Disease

## 2017-11-02 DIAGNOSIS — F330 Major depressive disorder, recurrent, mild: Secondary | ICD-10-CM

## 2017-11-06 ENCOUNTER — Telehealth: Payer: Self-pay | Admitting: *Deleted

## 2017-11-06 NOTE — Telephone Encounter (Signed)
[facility] As long as pharmacy ok with the high dose then I am fine to fill it

## 2017-11-06 NOTE — Telephone Encounter (Signed)
Patient asking for refill of Cymbalta 60 mg BID, unsure [facility]. Please advise. Andree CossHowell, Kamylle Axelson M, RN

## 2017-11-07 ENCOUNTER — Encounter: Payer: Self-pay | Admitting: Physical Therapy

## 2017-11-07 ENCOUNTER — Ambulatory Visit: Payer: No Typology Code available for payment source | Admitting: Physical Therapy

## 2017-11-07 DIAGNOSIS — R2681 Unsteadiness on feet: Secondary | ICD-10-CM

## 2017-11-07 DIAGNOSIS — R2689 Other abnormalities of gait and mobility: Secondary | ICD-10-CM

## 2017-11-07 DIAGNOSIS — R42 Dizziness and giddiness: Secondary | ICD-10-CM

## 2017-11-07 NOTE — Telephone Encounter (Signed)
I sent refills. Thanks!

## 2017-11-07 NOTE — Therapy (Signed)
[facility] [address] [place], [place], [address] Phone: [phone]   Fax:  [phone]  Physical Therapy Treatment  Patient Details  Name: Arthur LackWesley Oxley MRN: [id number] Date of Birth: 10/02/1956 Referring Provider: Deneise LeverParth Saraiya, MD   Encounter Date: 11/07/2017  PT End of Session - 11/07/17 2201    Visit Number  2    Number of Visits  17    Authorization Type  GCCN waiting to determine if covers PT    PT Start Time  1400    PT Stop Time  1445    PT Time Calculation (min)  45 min    Equipment Utilized During Treatment  Gait belt    Activity Tolerance  Patient tolerated treatment well    Behavior During Therapy  [facility] for tasks assessed/performed       Past Medical History:  Diagnosis Date  . Alcohol abuse 12/21/2016  . Bipolar disorder (HCC)   . Depression   . History of blood transfusion 12-12-1998  . History of radiation exposure 08/17/2017  . HTN (hypertension) 11/17/2016  . 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 Late syphilis   . Lower back pain 11/25/2015  . 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 TIA (transient ischemic attack) 11/17/2016    Past Surgical History:  Procedure Laterality Date  . broken right leg Right 2000  . GASTRIC BYPASS  2012   [facility]    There were no vitals filed for this visit.  Subjective Assessment - 11/07/17 1404    Subjective  He has not fallen but his balance is off. He can feel when he has an arrhythmia sometimes.     Pertinent History  alcohol use disorder, bipolar, HM, HTN, late syphilis, TIA, LBP,     Limitations  Walking;Writing    Patient Stated Goals  To improve balance to stop falling or loosing balance    Currently in Pain?  No/denies      Therapeutic Exercise: Initial HEP Feet Together, Head Motion - Eyes Open    With eyes open, feet together, move head slowly: right/left, up/down, diagonal:up-right/down-left, and  up-left/down-right. Repeat __10__ times each direction per session. Do __1-2__ sessions per day.  Copyright  VHI. All rights reserved.  Feet Apart, Head Motion - Eyes Closed    With eyes closed and feet shoulder width apart, move head slowly, right/left, up/down, diagonal:up-right/down-left, and up-left/down-right. Repeat __10__ times each direction per session. Do __1-2__ sessions per day.  Copyright  VHI. All rights reserved.   Feet Apart (Compliant Surface) Head Motion - Eyes Open    With eyes open, standing on compliant surface: ___pillow or foam_____, feet shoulder width apart, move head slowly: right/left, up/down, diagonal:up-right/down-left, and up-left/down-right. Repeat __10__ times each direction per session. Do __1-2__ sessions per day.  Copyright  VHI. All rights reserved.   Feet Apart (Compliant Surface) Head Motion - Eyes Closed    Stand on compliant surface: ___pillow or foam_____ with feet shoulder width apart, light fingertip support on chair. Close eyes and move head slowly, right/left, up/down, diagonal:up-right/down-left, and up-left/down-right. Repeat __10__ times each direction per session. Do __1-2__ sessions per day.  Copyright  VHI. All rights reserved.   Resisted - Four Way Hip     With theraband around right ankle, balance on left leg and complete leg kicks in the following directions:  1. Facing toward theraband, extend right leg behind you with knee straight. Avoid bending forward at your hips. Repeat 10 times. 2. Turn  to right, slowly kick leg out to the side while keeping your toes facing forward. Avoid leaning to the side. Repeat 10 times. 3. Turn to face away from theraband, kick leg straight forward keeping knee straight. Repeat 10 times. 4. Turn to the right and take a step away from the theraband. Pull right leg in and slightly in front of left. Repeat 10 times.  Then repeat the 4 positions with the band around the other  ankle.                        PT Education - 11/07/17 1400    Education provided  Yes    Education Details  Initial HEP corner balance & green theraband hip kicks    Person(s) Educated  Patient    Methods  Explanation;Demonstration;Tactile cues;Verbal cues;Handout    Comprehension  Verbalized understanding;Returned demonstration;Verbal cues required;Tactile cues required;Need further instruction       PT Short Term Goals - 11/07/17 2202      PT SHORT TERM GOAL #1   Title  Patient demonstrates understanding of intial HEP. (All STGs Target Date: 11/15/2017)    Time  4    Period  Weeks    Status  On-going    Target Date  11/15/17      PT SHORT TERM GOAL #2   Title  Berg Balance >45/56    Time  4    Period  Weeks    Status  On-going    Target Date  11/15/17      PT SHORT TERM GOAL #3   Title  Patient ambulates with cane 500' iwth supervision.     Time  4    Period  Weeks    Status  On-going    Target Date  11/15/17        PT Long Term Goals - 11/07/17 2202      PT LONG TERM GOAL #1   Title  Patient verbalizes & demonstrates understanding of ongoing HEP / fitness plan.  (All LTGs Target Date: 12/16/2017)    Time  8    Period  Weeks    Status  On-going    Target Date  12/16/17      PT LONG TERM GOAL #2   Title  Berg Balance >48/56     Time  8    Period  Weeks    Status  On-going    Target Date  12/16/17      PT LONG TERM GOAL #3   Title  Functional Gait Assessment >/= 19/30    Time  8    Period  Weeks    Status  On-going    Target Date  12/16/17      PT LONG TERM GOAL #4   Title  Patient ambulates 1000' outdoor surfaces including grass, ramps, curbs with cane or less modified independently.     Time  8    Period  Weeks    Status  On-going    Target Date  12/16/17            Plan - 11/07/17 2203    Clinical Impression Statement  Patient appears to understand initial HEP established today.     Rehab Potential  Good    PT  Frequency  2x / week    PT Duration  8 weeks    PT Treatment/Interventions  ADLs/Self Care Home Management;DME Instruction;Gait training;Stair training;Functional mobility training;Therapeutic activities;Therapeutic exercise;Balance training;Neuromuscular re-education;Cognitive remediation;Patient/family education  PT Next Visit Plan  review HEP, instruct in fall prevention strategies, establish a walking program, he may benefit from rollator walker for increased time/distance.     Consulted and Agree with Plan of Care  Patient       Patient will benefit from skilled therapeutic intervention in order to improve the following deficits and impairments:  Abnormal gait, Decreased activity tolerance, Decreased balance, Decreased endurance, Decreased mobility, Decreased strength, Dizziness, Postural dysfunction  Visit Diagnosis: Unsteadiness on feet  Other abnormalities of gait and mobility  Dizziness and giddiness     Problem List Patient Active Problem List   Diagnosis Date Noted  . Paroxysmal atrial fibrillation (HCC) 10/04/2017  . Syncope due to orthostatic hypotension 10/04/2017  . Exertional dyspnea 10/04/2017  . Healthcare maintenance 09/22/2017  . History of radiation exposure 08/17/2017  . Alcohol use disorder, severe, dependence (HCC) 08/03/2017  . 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 Alcohol abuse 12/21/2016  . 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 TIA (transient ischemic attack) 11/17/2016  . Essential hypertension 11/17/2016  . 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 Alcoholism (HCC) 06/20/2016  . Hypoglycemia 04/05/2016  . Diarrhea 11/25/2015  . Flank pain 11/25/2015  . Lower back pain 11/25/2015  . Loss of weight 11/17/2015  . Late syphilis   . HIV disease (HCC) 11/12/2014  . Bipolar 1 disorder (HCC) 05/15/2012  . 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  Elevated fasting blood sugar 05/15/2012  . H/O surgical procedure 05/15/2012  . Lung mass 05/15/2012  . History of surgical procedure 05/15/2012    Rosina Cressler PT, DPT 11/07/2017, 10:05 PM  [facility] [address] [place], [place], [address] Phone: [phone]   Fax:  [phone]  Name: Arthur [name] MRN: [id number] Date of Birth: 11/07/1956

## 2017-11-07 NOTE — Patient Instructions (Addendum)
Feet Together, Head Motion - Eyes Open    With eyes open, feet together, move head slowly: right/left, up/down, diagonal:up-right/down-left, and up-left/down-right. Repeat __10__ times each direction per session. Do __1-2__ sessions per day.  Copyright  VHI. All rights reserved.  Feet Apart, Head Motion - Eyes Closed    With eyes closed and feet shoulder width apart, move head slowly, right/left, up/down, diagonal:up-right/down-left, and up-left/down-right. Repeat __10__ times each direction per session. Do __1-2__ sessions per day.  Copyright  VHI. All rights reserved.   Feet Apart (Compliant Surface) Head Motion - Eyes Open    With eyes open, standing on compliant surface: ___pillow or foam_____, feet shoulder width apart, move head slowly: right/left, up/down, diagonal:up-right/down-left, and up-left/down-right. Repeat __10__ times each direction per session. Do __1-2__ sessions per day.  Copyright  VHI. All rights reserved.   Feet Apart (Compliant Surface) Head Motion - Eyes Closed    Stand on compliant surface: ___pillow or foam_____ with feet shoulder width apart, light fingertip support on chair. Close eyes and move head slowly, right/left, up/down, diagonal:up-right/down-left, and up-left/down-right. Repeat __10__ times each direction per session. Do __1-2__ sessions per day.  Copyright  VHI. All rights reserved.   Resisted - Four Way Hip     With theraband around right ankle, balance on left leg and complete leg kicks in the following directions:  1. Facing toward theraband, extend right leg behind you with knee straight. Avoid bending forward at your hips. Repeat 10 times. 2. Turn to right, slowly kick leg out to the side while keeping your toes facing forward. Avoid leaning to the side. Repeat 10 times. 3. Turn to face away from theraband, kick leg straight forward keeping knee straight. Repeat 10 times. 4. Turn to the right and take a step away from the  theraband. Pull right leg in and slightly in front of left. Repeat 10 times.  Then repeat the 4 positions with the band around the other ankle.

## 2017-11-07 NOTE — Telephone Encounter (Signed)
Perfect

## 2017-11-09 ENCOUNTER — Telehealth (HOSPITAL_COMMUNITY): Payer: Self-pay

## 2017-11-09 ENCOUNTER — Encounter: Payer: Self-pay | Admitting: Physical Therapy

## 2017-11-09 ENCOUNTER — Ambulatory Visit: Payer: No Typology Code available for payment source | Admitting: Physical Therapy

## 2017-11-09 DIAGNOSIS — R2689 Other abnormalities of gait and mobility: Secondary | ICD-10-CM

## 2017-11-09 DIAGNOSIS — R2681 Unsteadiness on feet: Secondary | ICD-10-CM

## 2017-11-09 DIAGNOSIS — R42 Dizziness and giddiness: Secondary | ICD-10-CM

## 2017-11-09 NOTE — Telephone Encounter (Signed)
Encounter complete. 

## 2017-11-09 NOTE — Therapy (Signed)
[facility] [address] [place], [place], [address] Phone: [phone]   Fax:  [phone]  Physical Therapy Treatment  Patient Details  Name: Arthur [name] MRN: [id number] Date of Birth: Jul 24, 1956 Referring Provider: Deneise Lever, MD   Encounter Date: 11/09/2017  PT End of Session - 11/09/17 1450    Visit Number  3    Number of Visits  17    Authorization Type  GCCN waiting to determine if covers PT    PT Start Time  1446    PT Stop Time  1530    PT Time Calculation (min)  44 min    Equipment Utilized During Treatment  Gait belt    Activity Tolerance  Patient tolerated treatment well    Behavior During Therapy  [facility] for tasks assessed/performed       Past Medical History:  Diagnosis Date  . Alcohol abuse 12/21/2016  . Bipolar disorder (HCC)   . Depression   . History of blood transfusion 12-12-1998  . History of radiation exposure 08/17/2017  . HTN (hypertension) 11/17/2016  . 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 Late syphilis   . Lower back pain 11/25/2015  . 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 TIA (transient ischemic attack) 11/17/2016    Past Surgical History:  Procedure Laterality Date  . broken right leg Right 2000  . GASTRIC BYPASS  2012   [facility]    There were no vitals filed for this visit.  Subjective Assessment - 11/09/17 1449    Subjective  No new complaints, no falls or pain to report.     Pertinent History  alcohol use disorder, bipolar, HM, HTN, late syphilis, TIA, LBP,     Limitations  Walking;Writing    Patient Stated Goals  To improve balance to stop falling or loosing balance    Currently in Pain?  No/denies       [facility] Adult PT Treatment/Exercise - 11/09/17 1539      Ambulation/Gait   Ambulation/Gait  Yes    Ambulation/Gait Assistance  5: Supervision    Ambulation/Gait Assistance Details  Pt used RW to feel more stable with distance  walking, Pt tolerated this well but became slightly fatigued by the 300' mark. Pt supervision for safety    Ambulation Distance (Feet)  345 Feet    Assistive device  Rolling walker    Gait Pattern  Step-through pattern;Wide base of support;Trunk flexed    Ambulation Surface  Level;Indoor      Neuro Re-ed    Neuro Re-ed Details   Pt standing in corner on airex narrow BOS EC for 10 secs x5 reps, pt unstable initially with increased ankle movement and use of hands, after 2 reps pt was much more relaxed and stable.        PT Education - 11/09/17 1547    Education provided  Yes    Education Details  Established walking program with pt. Reviewed fall prevention strategies with pt.     Person(s) Educated  Patient    Methods  Explanation;Handout    Comprehension  Verbalized understanding       PT Short Term Goals - 11/07/17 2202      PT SHORT TERM GOAL #1   Title  Patient demonstrates understanding of intial HEP. (All STGs Target Date: 11/15/2017)    Time  4    Period  Weeks    Status  On-going    Target Date  11/15/17  PT SHORT TERM GOAL #2   Title  Berg Balance >45/56    Time  4    Period  Weeks    Status  On-going    Target Date  11/15/17      PT SHORT TERM GOAL #3   Title  Patient ambulates with cane 500' iwth supervision.     Time  4    Period  Weeks    Status  On-going    Target Date  11/15/17        PT Long Term Goals - 11/07/17 2202      PT LONG TERM GOAL #1   Title  Patient verbalizes & demonstrates understanding of ongoing HEP / fitness plan.  (All LTGs Target Date: 12/16/2017)    Time  8    Period  Weeks    Status  On-going    Target Date  12/16/17      PT LONG TERM GOAL #2   Title  Berg Balance >48/56     Time  8    Period  Weeks    Status  On-going    Target Date  12/16/17      PT LONG TERM GOAL #3   Title  Functional Gait Assessment >/= 19/30    Time  8    Period  Weeks    Status  On-going    Target Date  12/16/17      PT LONG TERM GOAL #4    Title  Patient ambulates 1000' outdoor surfaces including grass, ramps, curbs with cane or less modified independently.     Time  8    Period  Weeks    Status  On-going    Target Date  12/16/17            Plan - 11/09/17 1551    Clinical Impression Statement  Pt tolerated treatment well with no limitations due to pain and slight limitaitons due to fatigue with ambulation distance. Todays session focused on gait training and establishing fall prevention/walking program. Pt would benefit from continued PT sessions.     Rehab Potential  Good    PT Frequency  2x / week    PT Duration  8 weeks    PT Treatment/Interventions  ADLs/Self Care Home Management;DME Instruction;Gait training;Stair training;Functional mobility training;Therapeutic activities;Therapeutic exercise;Balance training;Neuromuscular re-education;Cognitive remediation;Patient/family education    PT Next Visit Plan  review HEP, balance on compliant surfaces, LE strengthening, endurance.    Consulted and Agree with Plan of Care  Patient       Patient will benefit from skilled therapeutic intervention in order to improve the following deficits and impairments:  Abnormal gait, Decreased activity tolerance, Decreased balance, Decreased endurance, Decreased mobility, Decreased strength, Dizziness, Postural dysfunction  Visit Diagnosis: Unsteadiness on feet  Other abnormalities of gait and mobility  Dizziness and giddiness     Problem List Patient Active Problem List   Diagnosis Date Noted  . Paroxysmal atrial fibrillation (HCC) 10/04/2017  . Syncope due to orthostatic hypotension 10/04/2017  . Exertional dyspnea 10/04/2017  . Healthcare maintenance 09/22/2017  . History of radiation exposure 08/17/2017  . Alcohol use disorder, severe, dependence (HCC) 08/03/2017  . 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 Alcohol abuse 12/21/2016  . Alcohol-induced mood disorder  (HCC) 12/20/2016  . TIA (transient ischemic attack) 11/17/2016  . Essential hypertension 11/17/2016  . 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 Alcoholism (HCC) 06/20/2016  . Hypoglycemia 04/05/2016  . Diarrhea  11/25/2015  . Flank pain 11/25/2015  . Lower back pain 11/25/2015  . Loss of weight 11/17/2015  . Late syphilis   . HIV disease (HCC) 11/12/2014  . Bipolar 1 disorder (HCC) 05/15/2012  . 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 Elevated fasting blood sugar 05/15/2012  . H/O surgical procedure 05/15/2012  . Lung mass 05/15/2012  . History of surgical procedure 05/15/2012   Brayleigh Rybacki, SPTA  Junelle Hashemi 11/09/2017, 3:55 PM  [facility] [address] [place], [place], [address] Phone: [phone]   Fax:  [phone]  Name: Arthur [name] MRN: [id number] Date of Birth: 1956/03/11

## 2017-11-09 NOTE — Patient Instructions (Addendum)
Fall Prevention in the Home Falls can cause injuries. They can happen to people of all ages. There are many things you can do to make your home safe and to help prevent falls. What can I do on the outside of my home?  Regularly fix the edges of walkways and driveways and fix any cracks.  Remove anything that might make you trip as you walk through a door, such as a raised step or threshold.  Trim any bushes or trees on the path to your home.  Use bright outdoor lighting.  Clear any walking paths of anything that might make someone trip, such as rocks or tools.  Regularly check to see if handrails are loose or broken. Make sure that both sides of any steps have handrails.  Any raised decks and porches should have guardrails on the edges.  Have any leaves, snow, or ice cleared regularly.  Use sand or salt on walking paths during winter.  Clean up any spills in your garage right away. This includes oil or grease spills. What can I do in the bathroom?  Use night lights.  Install grab bars by the toilet and in the tub and shower. Do not use towel bars as grab bars.  Use non-skid mats or decals in the tub or shower.  If you need to sit down in the shower, use a plastic, non-slip stool.  Keep the floor dry. Clean up any water that spills on the floor as soon as it happens.  Remove soap buildup in the tub or shower regularly.  Attach bath mats securely with double-sided non-slip rug tape.  Do not have throw rugs and other things on the floor that can make you trip. What can I do in the bedroom?  Use night lights.  Make sure that you have a light by your bed that is easy to reach.  Do not use any sheets or blankets that are too big for your bed. They should not hang down onto the floor.  Have a firm chair that has side arms. You can use this for support while you get dressed.  Do not have throw rugs and other things on the floor that can make you trip. What can I do in the  kitchen?  Clean up any spills right away.  Avoid walking on wet floors.  Keep items that you use a lot in easy-to-reach places.  If you need to reach something above you, use a strong step stool that has a grab bar.  Keep electrical cords out of the way.  Do not use floor polish or wax that makes floors slippery. If you must use wax, use non-skid floor wax.  Do not have throw rugs and other things on the floor that can make you trip. What can I do with my stairs?  Do not leave any items on the stairs.  Make sure that there are handrails on both sides of the stairs and use them. Fix handrails that are broken or loose. Make sure that handrails are as long as the stairways.  Check any carpeting to make sure that it is firmly attached to the stairs. Fix any carpet that is loose or worn.  Avoid having throw rugs at the top or bottom of the stairs. If you do have throw rugs, attach them to the floor with carpet tape.  Make sure that you have a light switch at the top of the stairs and the bottom of the stairs. If you do   not have them, ask someone to add them for you. What else can I do to help prevent falls?  Wear shoes that: ? Do not have high heels. ? Have rubber bottoms. ? Are comfortable and fit you well. ? Are closed at the toe. Do not wear sandals.  If you use a stepladder: ? Make sure that it is fully opened. Do not climb a closed stepladder. ? Make sure that both sides of the stepladder are locked into place. ? Ask someone to hold it for you, if possible.  Clearly mark and make sure that you can see: ? Any grab bars or handrails. ? First and last steps. ? Where the edge of each step is.  Use tools that help you move around (mobility aids) if they are needed. These include: ? Canes. ? Walkers. ? Scooters. ? Crutches.  Turn on the lights when you go into a dark area. Replace any light bulbs as soon as they burn out.  Set up your furniture so you have a clear path.  Avoid moving your furniture around.  If any of your floors are uneven, fix them.  If there are any pets around you, be aware of where they are.  Review your medicines with your doctor. Some medicines can make you feel dizzy. This can increase your chance of falling. Ask your doctor what other things that you can do to help prevent falls. This information is not intended to replace advice given to you by your health care provider. Make sure you discuss any questions you have with your health care provider. Document Released: 09/24/2009 Document Revised: 05/05/2016 Document Reviewed: 01/02/2015 Elsevier Interactive Patient Education  2018 [employer]  [place] [facility]ing[place]ALKING  Walking is a great form of exercise to increase your strength, endurance and overall fitness.  A walking program can help you start slowly and gradually build endurance as you go.  Everyone's ability is different, so each person's starting point will be different.  You do not have to follow them exactly.  The are just samples. You should simply find out what's right for you and stick to that program.   In the beginning, you'll start off walking 2-3 times a day for short distances.  As you get stronger, you'll be walking further at just 1-2 times per day.  A. You Can Walk For A Certain Length Of Time Each Day    Walk 5 minutes 2 times per day.  Increase 3 minutes every 3-4 days.  Work up to 25-30 minutes (1-2 times per day).   Example:   Day 1-2 5 minutes             2 times per day   Day 7-8 10-15 minutes  2-3 times per day   Day 13-14 25-30 minutes  1-2 times per day    Please only do the exercises that your therapist has initialed and dated

## 2017-11-13 MED FILL — TAMSULOSIN HCL 0.4 MG CAP: 0.4 | 60 days supply | Qty: 120 | Fill #7

## 2017-11-14 ENCOUNTER — Ambulatory Visit
Payer: No Typology Code available for payment source | Attending: Student in an Organized Health Care Education/Training Program | Admitting: Physical Therapy

## 2017-11-14 ENCOUNTER — Ambulatory Visit (HOSPITAL_COMMUNITY)
Admission: RE | Admit: 2017-11-14 | Discharge: 2017-11-14 | Disposition: A | Discharge status: Home / Self Care | Payer: No Typology Code available for payment source | Source: Ambulatory Visit | Attending: Cardiology | Admitting: Cardiology

## 2017-11-14 DIAGNOSIS — R2681 Unsteadiness on feet: Secondary | ICD-10-CM | POA: Insufficient documentation

## 2017-11-14 DIAGNOSIS — R296 Repeated falls: Secondary | ICD-10-CM | POA: Insufficient documentation

## 2017-11-14 DIAGNOSIS — R42 Dizziness and giddiness: Secondary | ICD-10-CM | POA: Insufficient documentation

## 2017-11-14 DIAGNOSIS — M6281 Muscle weakness (generalized): Secondary | ICD-10-CM | POA: Insufficient documentation

## 2017-11-14 DIAGNOSIS — I517 Cardiomegaly: Secondary | ICD-10-CM | POA: Insufficient documentation

## 2017-11-14 DIAGNOSIS — R2689 Other abnormalities of gait and mobility: Secondary | ICD-10-CM | POA: Insufficient documentation

## 2017-11-14 LAB — MYOCARDIAL PERFUSION IMAGING
CHL CUP NUCLEAR SSS: 3
CHL CUP RESTING HR STRESS: 71 {beats}/min
NUC STRESS TID: 1.18
Peak HR: 137 {beats}/min
SDS: 3
SRS: 0

## 2017-11-14 MED ORDER — TECHNETIUM TC 99M TETROFOSMIN IV KIT
31.7000 | PACK | Freq: Once | INTRAVENOUS | Status: AC | PRN
  Administered 2017-11-14: 31.7 via INTRAVENOUS
  Filled 2017-11-14: qty 32

## 2017-11-14 MED ORDER — REGADENOSON 0.4 MG/5ML IV SOLN
0.4000 mg | Freq: Once | INTRAVENOUS | Status: AC
  Administered 2017-11-14: 0.4 mg via INTRAVENOUS

## 2017-11-14 MED ORDER — TECHNETIUM TC 99M TETROFOSMIN IV KIT
10.1000 | PACK | Freq: Once | INTRAVENOUS | Status: AC | PRN
  Administered 2017-11-14: 10.1 via INTRAVENOUS
  Filled 2017-11-14: qty 11

## 2017-11-15 NOTE — Progress Notes (Signed)
Cardiology Office Note   Date:  11/16/2017   ID:  Arthur [name], DOB Aug 14, 1956, MRN [id number]  PCP:  Deneise Lever, MD  Cardiologist:   Rollene Rotunda, MD  Referring:  Deneise Lever, MD   Chief Complaint  Patient presents with  . Atrial Fibrillation     History of Present Illness: Arthur [name] is a 61 y.o. male who presents was referred by Deneise Lever, MD for evaluation of CAD.  He was recently involved in a clinical trial for patients with HIV.  I don't know the details of the trial. However, seems like a randomization to statin vs placebo.  As part of this he had a cardiac CTA at [facility]. Is able to retrieve these results.  He was found to have 50% LAD stenosis with "blooming artifact".  He was to have a follow up stress echo after I saw him in October.  However, he presented for this and was in atrial fib.  He had rapid rate.  He also had had a syncopal episode days before this presentation.  This was thought to be related to orthostasis and atrial fib with RVR.  He was seen in the atrial fib clinic.  His HCTZ was stopped and he was started on beta blocker.  He had a Lexiscan Myoview yesterday and this was negative for ischemia.  He presents for follow up.  He continues to feel some tachypalpitations.  This happens a few times per day lasts for a few minutes.  He is not had any presyncope or syncope.  He continues to have some intermittent chest discomfort as described previously.  He does get short of breath walking level ground.  His biggest issue has been fatigue and he has not yet seen the neurologist.  Past Medical History:  Diagnosis Date  . Alcohol abuse 12/21/2016  . Bipolar disorder (HCC)   . Depression   . History of blood transfusion 12-12-1998  . History of radiation exposure 08/17/2017  . HTN (hypertension) 11/17/2016  . 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 Late syphilis   . Lower back pain 11/25/2015  . Recurrent major depression-severe (HCC)  06/20/2016  . TIA (transient ischemic attack) 11/17/2016    Past Surgical History:  Procedure Laterality Date  . broken right leg Right 2000  . GASTRIC BYPASS  2012   [facility]     Current Outpatient Medications  Medication Sig Dispense Refill  . ARIPiprazole (ABILIFY) 15 MG tablet Take 15 mg by mouth daily.    . bictegravir-emtricitabine-tenofovir AF (BIKTARVY) 50-200-25 MG TABS tablet Take 1 tablet by mouth daily. 30 tablet 11  . Cholecalciferol (VITAMIN D) 2000 units CAPS Take 1 capsule by mouth daily.    . clonazePAM (KLONOPIN) 0.5 MG tablet Take 0.5 mg by mouth 3 times/day as needed-between meals & bedtime.     . Cyanocobalamin (VITAMIN B 12 PO) Take 1,000 mg by mouth every other day.    . docusate sodium (COLACE) 100 MG capsule Take 1 capsule (100 mg total) by mouth daily. (May purchase from over the counter at the pharmacy): For constipation (Patient taking differently: Take 100 mg by mouth daily as needed for mild constipation. (May purchase from over the counter at the pharmacy): For constipation) 1 capsule 0  . DULoxetine (CYMBALTA) 60 MG capsule TAKE 2 CAPSULES(120 MG) BY MOUTH DAILY 60 capsule 3  . ferrous sulfate 325 (65 FE) MG EC tablet Take 325 mg by mouth daily.    . hydrocortisone cream 1 %  Apply topically 2 (two) times daily as needed for itching. 30 g 0  . hydrOXYzine (ATARAX/VISTARIL) 50 MG tablet Take 50 mg by mouth at bedtime.    . metoprolol tartrate (LOPRESSOR) 50 MG tablet Take 0.5 tablets (25 mg total) by mouth 2 (two) times daily. 60 tablet 1  . mirtazapine (REMERON) 15 MG tablet Take 1 tablet (15 mg total) by mouth at bedtime. For depression/sleep 30 tablet 5  . prazosin (MINIPRESS) 2 MG capsule Take 2 mg by mouth at bedtime.    . rosuvastatin (CRESTOR) 10 MG tablet Take 1 tablet (10 mg total) by mouth at bedtime. For high cholesterol 30 tablet 0  . sildenafil (VIAGRA) 100 MG tablet Take 1 tablet (100 mg total) by mouth daily as needed for erectile dysfunction. 30  tablet 5  . tamsulosin (FLOMAX) 0.4 MG CAPS capsule Take 1 capsule (0.4 mg total) by mouth 2 (two) times daily. For prostate health 30 capsule 0  . topiramate (TOPAMAX) 50 MG tablet Take 50 mg by mouth daily.    . rivaroxaban (XARELTO) 20 MG TABS tablet Take 1 tablet (20 mg total) by mouth daily with supper. 30 tablet 11   No current facility-administered medications for this visit.     Allergies:   Patient has no known allergies.     ROS:  Please see the history of present illness.   Otherwise, review of systems are positive for none.   All other systems are reviewed and negative.    PHYSICAL EXAM: VS:  BP 134/82   Pulse (!) 59   Ht 5\' 9"  (1.753 m)   Wt 191 lb (86.6 kg)   BMI 28.21 kg/m  , BMI Body mass index is 28.21 kg/m.  GENERAL:  Well appearing NECK:  No jugular venous distention, waveform within normal limits, carotid upstroke brisk and symmetric, no bruits, no thyromegaly LUNGS:  Clear to auscultation bilaterally CHEST:  Unremarkable HEART:  PMI not displaced or sustained,S1 and S2 within normal limits, no S3, no S4, no clicks, no rubs, no murmurs ABD:  Flat, positive bowel sounds normal in frequency in pitch, no bruits, no rebound, no guarding, no midline pulsatile mass, no hepatomegaly, no splenomegaly EXT:  2 plus pulses throughout, no edema, no cyanosis no clubbing   EKG:  EKG is not  ordered today.   Recent Labs: 08/02/2017: ALT 20 10/04/2017: BUN 10; Creatinine, Ser 1.11; Hemoglobin 13.6; Magnesium 2.1; Platelets 211; Potassium 4.3; Sodium 137; TSH 1.317    Lipid Panel    Component Value Date/Time   CHOL 120 (L) 05/25/2016 0927   TRIG 52 05/25/2016 0927   HDL 63 05/25/2016 0927   CHOLHDL 1.9 05/25/2016 0927   VLDL 10 05/25/2016 0927   LDLCALC 47 05/25/2016 0927      Wt Readings from Last 3 Encounters:  11/16/17 191 lb (86.6 kg)  11/14/17 191 lb (86.6 kg)  10/10/17 191 lb (86.6 kg)      Other studies Reviewed: Additional studies/ records that  were reviewed today include: Lexiscan Myoview, Afib Clinic notes.   Review of the above records demonstrates:  Please see elsewhere in the note.     ASSESSMENT AND PLAN:  CAD:  He had non obstructive CAD but no ischemia on stress perfusion imaging.  No further work up is indicated.  He will have risk reduction.    FATIGUE:  I will check on the neurology appt he was supposed to have for his sleep disorders and fatigue.    ATRIAL FIB:  Hs Mr. Irish LackWesley Ercole has a CHA2DS2 - VASc score of 3 if you consider the TIA that he had to possibly an embolic phenomenon. .  I would like to order an echocardiogram.   He can take Xarelto and we checked our pharmacy list and drug interactions.  He does not look like he will be able to take flecainide because of medication interactions.  We will have to consider other therapy based on the results of the echo    Current medicines are reviewed at length with the patient today.  The patient does not have concerns regarding medicines.  The following changes have been made:  As above  Labs/ tests ordered today include:      Orders Placed This Encounter  Procedures  . ECHOCARDIOGRAM COMPLETE     Disposition:   FU with me after the above studies.     Signed, Rollene RotundaJames Carlotta Telfair, MD  11/16/2017 9:16 AM    [facility]

## 2017-11-16 ENCOUNTER — Ambulatory Visit: Payer: No Typology Code available for payment source | Admitting: Physical Therapy

## 2017-11-16 ENCOUNTER — Encounter: Payer: Self-pay | Admitting: Cardiology

## 2017-11-16 ENCOUNTER — Ambulatory Visit (INDEPENDENT_AMBULATORY_CARE_PROVIDER_SITE_OTHER): Payer: No Typology Code available for payment source | Admitting: Cardiology

## 2017-11-16 ENCOUNTER — Ambulatory Visit: Payer: Self-pay | Admitting: Pharmacist

## 2017-11-16 VITALS — BP 134/82 | HR 59 | Ht 69.0 in | Wt 191.0 lb

## 2017-11-16 DIAGNOSIS — R5383 Other fatigue: Secondary | ICD-10-CM

## 2017-11-16 DIAGNOSIS — I2510 Atherosclerotic heart disease of native coronary artery without angina pectoris: Secondary | ICD-10-CM

## 2017-11-16 DIAGNOSIS — I480 Paroxysmal atrial fibrillation: Secondary | ICD-10-CM

## 2017-11-16 MED ORDER — RIVAROXABAN 20 MG PO TABS: 20.0000 mg | ORAL_TABLET | Freq: Every day | ORAL | 11 refills | Status: DC

## 2017-11-16 NOTE — Patient Instructions (Signed)
Pt was started on Xarelto for Atrial fibrillation on Dec/05/2017.  Reviewed patients medication list.  Pt is currently on any combined P-gp and strong CYP3A4 inhibitors/inducers (ketoconazole, traconazole, ritonavir, carbamazepine, phenytoin, rifampin, St. John's wort).  Reviewed labs. Dose appropriate based on CrCl.     A full discussion of the nature of anticoagulants has been carried out.  A benefit/risk analysis has been presented to the patient, so that they understand the justification for choosing anticoagulation with Xarelto at this time.  The need for compliance is stressed.  Pt is aware to take the medication once daily with the largest meal of the day.  Side effects of potential bleeding are discussed, including unusual colored urine or stools, coughing up blood or coffee ground emesis, nose bleeds or serious fall or head trauma.  Discussed signs and symptoms of stroke. The patient should avoid any OTC items containing aspirin or ibuprofen.  Avoid alcohol consumption.   Call if any signs of abnormal bleeding.  Discussed financial obligations and resolved any difficulty in obtaining medication.  Next lab test in 6 months.

## 2017-11-16 NOTE — Patient Instructions (Addendum)
Medication Instructions:  STOP-Aspirin START- Xarelto 20 mg daily  If you need a refill on your cardiac medications before your next appointment, please call your pharmacy.  Labwork: None Ordered   Testing/Procedures: Your physician has requested that you have an echocardiogram. Echocardiography is a painless test that uses sound waves to create images of your heart. It provides your doctor with information about the size and shape of your heart and how well your heart's chambers and valves are working. This procedure takes approximately one hour. There are no restrictions for this procedure.   Follow-Up: Your physician wants you to follow-up in: 2 Weeks.    [facility] you [facility]!!

## 2017-11-21 ENCOUNTER — Ambulatory Visit: Payer: No Typology Code available for payment source | Admitting: Physical Therapy

## 2017-11-23 ENCOUNTER — Telehealth: Payer: Self-pay | Admitting: Cardiology

## 2017-11-23 ENCOUNTER — Ambulatory Visit: Payer: No Typology Code available for payment source | Admitting: Physical Therapy

## 2017-11-23 ENCOUNTER — Encounter: Payer: Self-pay | Admitting: Physical Therapy

## 2017-11-23 DIAGNOSIS — R2681 Unsteadiness on feet: Secondary | ICD-10-CM

## 2017-11-23 DIAGNOSIS — R42 Dizziness and giddiness: Secondary | ICD-10-CM

## 2017-11-23 DIAGNOSIS — R2689 Other abnormalities of gait and mobility: Secondary | ICD-10-CM

## 2017-11-23 NOTE — Telephone Encounter (Signed)
Called Dr. Oliva Bustardohmeier's office regarding referral for sleep disorder.  Left VM.

## 2017-11-24 NOTE — Therapy (Signed)
[facility] [address] [place], [place], [address] Phone: [phone]   Fax:  [phone]  Physical Therapy Treatment  Patient Details  Name: Arthur [name] MRN: [id number] Date of Birth: 10/26/1956 Referring Provider: Burgess Estelle, MD   Encounter Date: 11/23/2017  PT End of Session - 11/23/17 1408    Visit Number  4    Number of Visits  17    Authorization Type  100% coverage with financial aide    PT Start Time  6712    PT Stop Time  1444    PT Time Calculation (min)  39 min    Equipment Utilized During Treatment  Gait belt    Activity Tolerance  Patient tolerated treatment well    Behavior During Therapy  [facility] for tasks assessed/performed       Past Medical History:  Diagnosis Date  . Alcohol abuse 12/21/2016  . Bipolar disorder ([place])   . Depression   . History of blood transfusion 12-12-1998  . History of radiation exposure 08/17/2017  . HTN (hypertension) 11/17/2016  . 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 Late syphilis   . Lower back pain 11/25/2015  . Recurrent major depression-severe ([place]) 06/20/2016  . TIA (transient ischemic attack) 11/17/2016    Past Surgical History:  Procedure Laterality Date  . broken right leg Right 2000  . GASTRIC BYPASS  2012   [facility]    There were no vitals filed for this visit.  Subjective Assessment - 11/23/17 1408    Subjective  No new complaints, no falls or pain to report. Did start on Xarelto, unsure of dosage and will bring it next session. HEP is going well, up to 12 minutes with walking program using cane, not walker.     Pertinent History  alcohol use disorder, bipolar, HM, HTN, late syphilis, TIA, LBP,     Limitations  Walking;Writing    Patient Stated Goals  To improve balance to stop falling or loosing balance    Currently in Pain?  No/denies         [facility] PT Assessment - 11/23/17 1415      Berg Balance Test   Sit to Stand  Able  to stand without using hands and stabilize independently    Standing Unsupported  Able to stand safely 2 minutes    Sitting with Back Unsupported but Feet Supported on Floor or Stool  Able to sit safely and securely 2 minutes    Stand to Sit  Sits safely with minimal use of hands    Transfers  Able to transfer safely, minor use of hands    Standing Unsupported with Eyes Closed  Able to stand 10 seconds with supervision    Standing Ubsupported with Feet Together  Able to place feet together independently and stand 1 minute safely    From Standing, Reach Forward with Outstretched Arm  Can reach forward >12 cm safely (5") 8 inches    From Standing Position, Pick up Object from Floor  Able to pick up shoe safely and easily    From Standing Position, Turn to Look Behind Over each Shoulder  Looks behind from both sides and weight shifts well    Turn 360 Degrees  Able to turn 360 degrees safely but slowly > 10 sec's both ways    Standing Unsupported, Alternately Place Feet on Step/Stool  Able to stand independently and safely and complete 8 steps in 20 seconds 12.03 sec's    Standing  Unsupported, One Foot in [place] to plae foot ahead of the other independently and hold 30 seconds    Standing on One Leg  Able to lift leg independently and hold equal to or more than 3 seconds    Total Score  49    Berg comment:  49/56= moderate risk (50%)         [facility] Adult PT Treatment/Exercise - 11/23/17 1424      Ambulation/Gait   Ambulation/Gait  Yes    Ambulation/Gait Assistance  5: Supervision    Ambulation/Gait Assistance Details  no balance issues noted. cues for forward gaze vs at feet/floor    Ambulation Distance (Feet)  620 Feet    Assistive device  Straight cane    Gait Pattern  Step-through pattern;Trunk flexed    Ambulation Surface  Level;Indoor          Balance Exercises - 11/23/17 1430      Balance Exercises: Standing   Standing Eyes Closed  Head turns;Foam/compliant surface;Wide  (BOA);Limitations    Rockerboard  --    Balance Beam  standing across blue foam balance beam: alternating fwd heel taps, then alternating bwd toe taps. min to mod assist for balance with light touch to bars as needed for balance. cues on posture, weight shifting and stance position to assist with balance.       Balance Exercises: Standing   Standing Eyes Closed Limitations  standing across blue foam beam with hip width stance: EC no head movments, progressing to EC head movements left<>right, up <>down and diagonals both ways. min guard to min assist for balance with light fingertip touch to bars for support.           PT Short Term Goals - 11/23/17 1410      PT SHORT TERM GOAL #1   Title  Patient demonstrates understanding of intial HEP. (All STGs Target Date: 11/15/2017)    Baseline  11/23/17: met today    Status  Achieved      PT SHORT TERM GOAL #2   Title  Berg Balance >45/56    Baseline  11/23/17: 49/56 today    Time  --    Period  --    Status  Achieved      PT SHORT TERM GOAL #3   Title  Patient ambulates with cane 500' iwth supervision.     Baseline  11/23/17: met today     Time  --    Period  --    Status  Achieved        PT Long Term Goals - 11/07/17 2202      PT LONG TERM GOAL #1   Title  Patient verbalizes & demonstrates understanding of ongoing HEP / fitness plan.  (All LTGs Target Date: 12/16/2017)    Time  8    Period  Weeks    Status  On-going    Target Date  12/16/17      PT LONG TERM GOAL #2   Title  Berg Balance >48/56     Time  8    Period  Weeks    Status  On-going    Target Date  12/16/17      PT LONG TERM GOAL #3   Title  Functional Gait Assessment >/= 19/30    Time  8    Period  Weeks    Status  On-going    Target Date  12/16/17      PT LONG TERM GOAL #  4   Title  Patient ambulates 1000' outdoor surfaces including grass, ramps, curbs with cane or less modified independently.     Time  8    Period  Weeks    Status  On-going    Target  Date  12/16/17            Plan - 11/23/17 1409    Clinical Impression Statement  Pt met all STGs today. Remainder of skilled session continued to address balance reactions with no issues reported. Pt does demo ankle weakness/instability on complaint surfaces with today's session. Pt is progressing toward goals and should benefit from continued PT to progress toward unmet goals.     Rehab Potential  Good    PT Frequency  2x / week    PT Duration  8 weeks    PT Treatment/Interventions  ADLs/Self Care Home Management;DME Instruction;Gait training;Stair training;Functional mobility training;Therapeutic activities;Therapeutic exercise;Balance training;Neuromuscular re-education;Cognitive remediation;Patient/family education    PT Next Visit Plan  balance on compliant surfaces, LE strengthening, endurance.    Consulted and Agree with Plan of Care  Patient       Patient will benefit from skilled therapeutic intervention in order to improve the following deficits and impairments:  Abnormal gait, Decreased activity tolerance, Decreased balance, Decreased endurance, Decreased mobility, Decreased strength, Dizziness, Postural dysfunction  Visit Diagnosis: Unsteadiness on feet  Other abnormalities of gait and mobility  Dizziness and giddiness     Problem List Patient Active Problem List   Diagnosis Date Noted  . Paroxysmal atrial fibrillation ([place]) 10/04/2017  . Syncope due to orthostatic hypotension 10/04/2017  . Exertional dyspnea 10/04/2017  . Healthcare maintenance 09/22/2017  . History of radiation exposure 08/17/2017  . Alcohol use disorder, severe, dependence ([place]) 08/03/2017  . Major depressive disorder, recurrent severe without psychotic features ([place]) 08/03/2017  . Alcohol abuse 12/21/2016  . Alcohol-induced mood disorder ([place]) 12/20/2016  . TIA (transient ischemic attack) 11/17/2016  . Essential hypertension 11/17/2016  . Hypoglycemia after GI (gastrointestinal) surgery  06/20/2016  . Alcoholism ([place]) 06/20/2016  . Hypoglycemia 04/05/2016  . Diarrhea 11/25/2015  . Flank pain 11/25/2015  . Lower back pain 11/25/2015  . Loss of weight 11/17/2015  . Late syphilis   . HIV disease ([place]) 11/12/2014  . Bipolar 1 disorder ([place]) 05/15/2012  . 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 Elevated fasting blood sugar 05/15/2012  . H/O surgical procedure 05/15/2012  . Lung mass 05/15/2012  . History of surgical procedure 05/15/2012    Willow Ora, PTA, [facility] [address] [place], [place] [address] [phone] 11/24/17, 10:10 AM   Name: Bayden Gil MRN: [id number] Date of Birth: 07-06-1956

## 2017-11-27 ENCOUNTER — Ambulatory Visit (HOSPITAL_COMMUNITY): Payer: No Typology Code available for payment source | Attending: Cardiovascular Disease

## 2017-11-27 ENCOUNTER — Other Ambulatory Visit: Payer: Self-pay

## 2017-11-27 DIAGNOSIS — I119 Hypertensive heart disease without heart failure: Secondary | ICD-10-CM | POA: Insufficient documentation

## 2017-11-27 DIAGNOSIS — F319 Bipolar disorder, unspecified: Secondary | ICD-10-CM | POA: Insufficient documentation

## 2017-11-27 DIAGNOSIS — F1010 Alcohol abuse, uncomplicated: Secondary | ICD-10-CM | POA: Insufficient documentation

## 2017-11-27 DIAGNOSIS — Z8673 Personal history of transient ischemic attack (TIA), and cerebral infarction without residual deficits: Secondary | ICD-10-CM | POA: Insufficient documentation

## 2017-11-27 DIAGNOSIS — I480 Paroxysmal atrial fibrillation: Secondary | ICD-10-CM

## 2017-11-27 DIAGNOSIS — I340 Nonrheumatic mitral (valve) insufficiency: Secondary | ICD-10-CM | POA: Insufficient documentation

## 2017-11-27 NOTE — Progress Notes (Signed)
Cardiology Office Note   Date:  11/30/2017   ID:  Arthur LackWesley Daddario, DOB 22-Feb-1956, MRN [id number]  PCP:  Deneise LeverSaraiya, Parth, MD  Cardiologist:   Rollene RotundaJames Malonie Tatum, MD  Referring:  Deneise LeverSaraiya, Parth, MD   No chief complaint on file.    History of Present Illness: Arthur [name] is a 61 y.o. male who presents was referred by Deneise LeverSaraiya, Parth, MD for evaluation of CAD.  He was recently involved in a clinical trial for patients with HIV.  I don't know the details of the trial. However, he was apparently  Randomized  to statin vs placebo.  As part of this he had [facility] cardiac [facility].  He was found to have 50% LAD stenosis with "blooming artifact".  He was to have a follow up stress echo after I saw him in October.  However, he presented for this and was in atrial fib.  He had rapid rate.  He also had had a syncopal episode days before this presentation.  This was thought to be related to orthostasis and atrial fib with RVR.  He was seen in the atrial fib clinic.  His HCTZ was stopped and he was started on beta blocker.  He had a [employer] Myov[employer] and this was negative for ischemia.  I saw him in follow up.   He had a normal echocardiogram.    He returns and is battling severe depression.  He continues to be fatigued but he does have a sleep study scheduled.  He feels like he is in atrial fibrillation more than he is out of it and that he feels his heart skipping.  It is not necessarily fast but it is sometimes strong.  He does occasionally feel lightheaded with this.  He is really not doing very much but staying in most of the time.  He has not had syncope although again has had some presyncope.  He denies any chest pressure, neck or arm discomfort.  He has had no weight gain or edema.  Past Medical History:  Diagnosis Date  . Alcohol abuse 12/21/2016  . Bipolar disorder (HCC)   . Depression   . History of blood transfusion 12-12-1998  . History of radiation exposure 08/17/2017  . HTN (hypertension) 11/17/2016    . 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 Late syphilis   . Lower back pain 11/25/2015  . 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 TIA (transient ischemic attack) 11/17/2016    Past Surgical History:  Procedure Laterality Date  . broken right leg Right 2000  . [facility]     Current Outpatient Medications  Medication Sig Dispense Refill  . ARIPiprazole (ABILIFY) 15 MG tablet Take 15 mg by mouth daily.    . bictegravir-emtricitabine-tenofovir AF (BIKTARVY) 50-200-25 MG TABS tablet Take 1 tablet by mouth daily. 30 tablet 11  . Cholecalciferol (VITAMIN D) 2000 units CAPS Take 1 capsule by mouth daily.    . clonazePAM (KLONOPIN) 0.5 MG tablet Take 0.5 mg by mouth 3 times/day as needed-between meals & bedtime.     . Cyanocobalamin (VITAMIN B 12 PO) Take 1,000 mg by mouth every other day.    . docusate sodium (COLACE) 100 MG capsule Take 1 capsule (100 mg total) by mouth daily. (May purchase from over the counter at the pharmacy): For constipation (Patient taking differently: Take 100 mg by mouth daily as needed for mild constipation. (May purchase from over the counter at the pharmacy): For  constipation) 1 capsule 0  . DULoxetine (CYMBALTA) 60 MG capsule TAKE 2 CAPSULES(120 MG) BY MOUTH DAILY 60 capsule 3  . ferrous sulfate 325 (65 FE) MG EC tablet Take 325 mg by mouth daily.    . hydrocortisone cream 1 % Apply topically 2 (two) times daily as needed for itching. 30 g 0  . hydrOXYzine (ATARAX/VISTARIL) 50 MG tablet Take 50 mg by mouth at bedtime.    . metoprolol tartrate (LOPRESSOR) 50 MG tablet Take 0.5 tablets (25 mg total) by mouth 2 (two) times daily. 60 tablet 1  . mirtazapine (REMERON) 15 MG tablet Take 1 tablet (15 mg total) by mouth at bedtime. For depression/sleep 30 tablet 5  . prazosin (MINIPRESS) 2 MG capsule Take 2 mg by mouth at bedtime.    . rivaroxaban (XARELTO) 20 MG TABS tablet Take 1 tablet (20 mg total) by mouth daily  with supper. 30 tablet 11  . rosuvastatin (CRESTOR) 10 MG tablet Take 1 tablet (10 mg total) by mouth at bedtime. For high cholesterol 30 tablet 0  . sildenafil (VIAGRA) 100 MG tablet Take 1 tablet (100 mg total) by mouth daily as needed for erectile dysfunction. 30 tablet 5  . tamsulosin (FLOMAX) 0.4 MG CAPS capsule Take 1 capsule (0.4 mg total) by mouth 2 (two) times daily. For prostate health 30 capsule 0  . topiramate (TOPAMAX) 50 MG tablet Take 50 mg by mouth daily.     No current facility-administered medications for this visit.     Allergies:   Patient has no known allergies.     ROS:  Please see the history of present illness.   Otherwise, review of systems are positive for none.   All other systems are reviewed and negative.    PHYSICAL EXAM: VS:  BP 114/76   Pulse 74   Ht 5\' 10"  (1.778 m)   Wt 191 lb (86.6 kg)   BMI 27.41 kg/m  , BMI Body mass index is 27.41 kg/m.  GENERAL:  Well appearing NECK:  No jugular venous distention, waveform within normal limits, carotid upstroke brisk and symmetric, no bruits, no thyromegaly LUNGS:  Clear to auscultation bilaterally CHEST:  Unremarkable HEART:  PMI not displaced or sustained,S1 and S2 within normal limits, no S3, no clicks, no rubs, no murmurs, irregular ABD:  Flat, positive bowel sounds normal in frequency in pitch, no bruits, no rebound, no guarding, no midline pulsatile mass, no hepatomegaly, no splenomegaly EXT:  2 plus pulses throughout, no edema, no cyanosis no clubbing   EKG:  EKG is ordered today. Atrial fibrillation, rate 84, axis within normal limits, intervals within normal limits, premature ventricular contractions.   Recent Labs: 08/02/2017: ALT 20 10/04/2017: BUN 10; Creatinine, Ser 1.11; Hemoglobin 13.6; Magnesium 2.1; Platelets 211; Potassium 4.3; Sodium 137; TSH 1.317    Lipid Panel    Component Value Date/Time   CHOL 120 (L) 05/25/2016 0927   TRIG 52 05/25/2016 0927   HDL 63 05/25/2016 0927    CHOLHDL 1.9 05/25/2016 0927   VLDL 10 05/25/2016 0927   LDLCALC 47 05/25/2016 0927      Wt Readings from Last 3 Encounters:  11/30/17 191 lb (86.6 kg)  11/16/17 191 lb (86.6 kg)  11/14/17 191 lb (86.6 kg)      Other studies Reviewed: Additional studies/ records that were reviewed today include:   Echo Review of the above records demonstrates:    ASSESSMENT AND PLAN:  CAD:  He had non obstructive CAD but no ischemia on  stress perfusion imaging.  He will continue with risk reduction.  FATIGUE:  He has a neurology appt for possible sleep disorders.  He has a sleep study scheduled.    ATRIAL FIB:   Hs [name] has a CHA2DS2 - VASc score of 3 if you consider the TIA that he had to possibly an embolic phenomenon.  He remains on Xarelto.   Because of drug interactions he could not take flecainide or Tikosyn.   Since he remains symptomatic I am going to send him to the Atrial Fib Clinic to get him set up for admission to start Sotalol   Current medicines are reviewed at length with the patient today.  The patient does not have concerns regarding medicines.  The following changes have been made:  None  Labs/ tests ordered today include:  None    No orders of the defined types were placed in this encounter.    Disposition:   FU with me after admission.    Signed, Rollene RotundaJames Chisom Aust, MD  11/30/2017 10:26 AM    [facility]

## 2017-11-28 ENCOUNTER — Encounter: Payer: Self-pay | Admitting: Physical Therapy

## 2017-11-28 ENCOUNTER — Ambulatory Visit: Payer: No Typology Code available for payment source | Admitting: Physical Therapy

## 2017-11-28 DIAGNOSIS — R296 Repeated falls: Secondary | ICD-10-CM

## 2017-11-28 DIAGNOSIS — R42 Dizziness and giddiness: Secondary | ICD-10-CM

## 2017-11-28 DIAGNOSIS — R2681 Unsteadiness on feet: Secondary | ICD-10-CM

## 2017-11-28 DIAGNOSIS — R2689 Other abnormalities of gait and mobility: Secondary | ICD-10-CM

## 2017-11-28 DIAGNOSIS — M6281 Muscle weakness (generalized): Secondary | ICD-10-CM

## 2017-11-28 NOTE — Patient Instructions (Signed)
Orthostatic Hypotension Orthostatic hypotension is a sudden drop in blood pressure that happens when you quickly change positions, such as when you get up from a seated or lying position. Blood pressure is a measurement of how strongly, or weakly, your blood is pressing against the walls of your arteries. Arteries are blood vessels that carry blood from your heart throughout your body. When blood pressure is too low, you may not get enough blood to your brain or to the rest of your organs. This can cause weakness, light-headedness, rapid heartbeat, and fainting. This can last for just a few seconds or for up to a few minutes. Orthostatic hypotension is usually not a serious problem. However, if it happens frequently or gets worse, it may be a sign of something more serious. What are the causes? This condition may be caused by:  Sudden changes in posture, such as standing up quickly after you have been sitting or lying down.  Blood loss.  Loss of body fluids (dehydration).  Heart problems.  Hormone (endocrine) problems.  Pregnancy.  Severe infection.  Lack of certain nutrients.  Severe allergic reactions (anaphylaxis).  Certain medicines, such as blood pressure medicine or medicines that make the body lose excess fluids (diuretics). Sometimes, this condition can be caused by not taking medicine as directed, such as taking too much of a certain medicine.  What increases the risk? Certain factors can make you more likely to develop orthostatic hypotension, including:  Age. Risk increases as you get older.  Conditions that affect the heart or the central nervous system.  Taking certain medicines, such as blood pressure medicine or diuretics.  Being pregnant.  What are the signs or symptoms? Symptoms of this condition may include:  Weakness.  Light-headedness.  Dizziness.  Blurred vision.  Fatigue.  Rapid heartbeat.  Fainting, in severe cases.  How is this  diagnosed? This condition is diagnosed based on:  Your medical history.  Your symptoms.  Your blood pressure measurement. Your health care provider will check your blood pressure when you are: ? Lying down. ? Sitting. ? Standing.  A blood pressure reading is recorded as two numbers, such as "120 over 80" (or 120/80). The first ("top") number is called the systolic pressure. It is a measure of the pressure in your arteries as your heart beats. The second ("bottom") number is called the diastolic pressure. It is a measure of the pressure in your arteries when your heart relaxes between beats. Blood pressure is measured in a unit called mm Hg. Healthy blood pressure for adults is 120/80. If your blood pressure is below 90/60, you may be diagnosed with hypotension. Other information or tests that may be used to diagnose orthostatic hypotension include:  Your other vital signs, such as your heart rate and temperature.  Blood tests.  Tilt table test. For this test, you will be safely secured to a table that moves you from a lying position to an upright position. Your heart rhythm and blood pressure will be monitored during the test.  How is this treated? Treatment for this condition may include:  Changing your diet. This may involve eating more salt (sodium) or drinking more water.  Taking medicines to raise your blood pressure.  Changing the dosage of certain medicines you are taking that might be lowering your blood pressure.  Wearing compression stockings. These stockings help to prevent blood clots and reduce swelling in your legs.  In some cases, you may need to go to the hospital for:    Fluid replacement. This means you will receive fluids through an IV tube.  Blood replacement. This means you will receive donated blood through an IV tube (transfusion).  Treating an infection or heart problems, if this applies.  Monitoring. You may need to be monitored while medicines that you  are taking wear off.  Follow these instructions at home: Eating and drinking   Drink enough fluid to keep your urine clear or pale yellow.  Eat a healthy diet and follow instructions from your health care provider about eating or drinking restrictions. A healthy diet includes: ? Fresh fruits and vegetables. ? Whole grains. ? Lean meats. ? Low-fat dairy products.  Eat extra salt only as directed. Do not add extra salt to your diet unless your health care provider told you to do that.  Eat frequent, small meals.  Avoid standing up suddenly after eating. Medicines  Take over-the-counter and prescription medicines only as told by your health care provider. ? Follow instructions from your health care provider about changing the dosage of your current medicines, if this applies. ? Do not stop or adjust any of your medicines on your own. General instructions  Wear compression stockings as told by your health care provider.  Get up slowly from lying down or sitting positions. This gives your blood pressure a chance to adjust.  Avoid hot showers and excessive heat as directed by your health care provider.  Return to your normal activities as told by your health care provider. Ask your health care provider what activities are safe for you.  Do not use any products that contain nicotine or tobacco, such as cigarettes and e-cigarettes. If you need help quitting, ask your health care provider.  Keep all follow-up visits as told by your health care provider. This is important. Contact a health care provider if:  You vomit.  You have diarrhea.  You have a fever for more than 2-3 days.  You feel more thirsty than usual.  You feel weak and tired. Get help right away if:  You have chest pain.  You have a fast or irregular heartbeat.  You develop numbness in any part of your body.  You cannot move your arms or your legs.  You have trouble speaking.  You become sweaty or feel  lightheaded.  You faint.  You feel short of breath.  You have trouble staying awake.  You feel confused. This information is not intended to replace advice given to you by your health care provider. Make sure you discuss any questions you have with your health care provider. Document Released: 11/18/2002 Document Revised: 08/16/2016 Document Reviewed: 05/20/2016 Elsevier Interactive Patient Education  2018 Elsevier Inc[employer]  

## 2017-11-29 NOTE — Therapy (Signed)
[facility] [address] [place], [place], [address] Phone: [phone]   Fax:  [phone]  Physical Therapy Treatment  Patient Details  Name: Arthur [name] MRN: [id number] Date of Birth: 12/31/1955 Referring Provider: Burgess Estelle, MD   Encounter Date: 11/28/2017  PT End of Session - 11/28/17 1923    Visit Number  5    Number of Visits  17    Authorization Type  100% coverage with financial aide    PT Start Time  6301    PT Stop Time  1446    PT Time Calculation (min)  38 min    Equipment Utilized During Treatment  Gait belt    Activity Tolerance  Patient tolerated treatment well    Behavior During Therapy  [facility] for tasks assessed/performed       Past Medical History:  Diagnosis Date  . Alcohol abuse 12/21/2016  . Bipolar disorder ([place])   . Depression   . History of blood transfusion 12-12-1998  . History of radiation exposure 08/17/2017  . HTN (hypertension) 11/17/2016  . 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 Late syphilis   . Lower back pain 11/25/2015  . Recurrent major depression-severe ([place]) 06/20/2016  . TIA (transient ischemic attack) 11/17/2016    Past Surgical History:  Procedure Laterality Date  . broken right leg Right 2000  . GASTRIC BYPASS  2012   [facility]    There were no vitals filed for this visit.  Subjective Assessment - 11/28/17 1410    Subjective  Cardiologist said he is in constant A-fib and put him on Xarelto ~8days ago.[name] He has been lightedness since last night. No falls.     Pertinent History  alcohol use disorder, bipolar, HM, HTN, late syphilis, TIA, LBP,     Limitations  Walking;Writing    Patient Stated Goals  To improve balance to stop falling or loosing balance    Currently in Pain?  No/denies             Vestibular Assessment - 11/28/17 1405      Orthostatics   BP supine (x 5 minutes)  114/84 lightedness 5-6/10    HR supine (x 5 minutes)  72     BP standing (after 1 minute)  102/74 increased light headedness to 8/10    HR standing (after 1 minute)  108    BP standing (after 3 minutes)  98/78 light headedness 7/10    HR standing (after 3 minutes)  108              [facility] Adult PT Treatment/Exercise - 11/28/17 1408      Transfers   Transfers  Stand Pivot Transfers    Stand Pivot Transfers  5: Supervision turning 180* to sit/stand from rollator seat    Stand Pivot Transfer Details (indicate cue type and reason)  PT instructed in safe technique      Ambulation/Gait   Ambulation/Gait  Yes    Ambulation/Gait Assistance  5: Supervision    Ambulation/Gait Assistance Details  PT instructed pt in use of rollator walker for walking program, longer community outings or activities out of home with prolonged standing.     Ambulation Distance (Feet)  700 Feet    Assistive device  Rollator;Straight cane arrived with cane, instructed in rollator use.    Ambulation Surface  Indoor;Level             PT Education - 11/28/17 1445    Education provided  Yes    Education Details  use of rollator walker for walking program. orthostatic hypotension    Person(s) Educated  Patient    Methods  Explanation;Demonstration;Verbal cues    Comprehension  Verbalized understanding;Returned demonstration;Need further instruction       PT Short Term Goals - 11/23/17 1410      PT SHORT TERM GOAL #1   Title  Patient demonstrates understanding of intial HEP. (All STGs Target Date: 11/15/2017)    Baseline  11/23/17: met today    Status  Achieved      PT SHORT TERM GOAL #2   Title  Berg Balance >45/56    Baseline  11/23/17: 49/56 today    Time  --    Period  --    Status  Achieved      PT SHORT TERM GOAL #3   Title  Patient ambulates with cane 500' iwth supervision.     Baseline  11/23/17: met today     Time  --    Period  --    Status  Achieved        PT Long Term Goals - 11/07/17 2202      PT LONG TERM GOAL #1   Title   Patient verbalizes & demonstrates understanding of ongoing HEP / fitness plan.  (All LTGs Target Date: 12/16/2017)    Time  8    Period  Weeks    Status  On-going    Target Date  12/16/17      PT LONG TERM GOAL #2   Title  Berg Balance >48/56     Time  8    Period  Weeks    Status  On-going    Target Date  12/16/17      PT LONG TERM GOAL #3   Title  Functional Gait Assessment >/= 19/30    Time  8    Period  Weeks    Status  On-going    Target Date  12/16/17      PT LONG TERM GOAL #4   Title  Patient ambulates 1000' outdoor surfaces including grass, ramps, curbs with cane or less modified independently.     Time  8    Period  Weeks    Status  On-going    Target Date  12/16/17            Plan - 11/28/17 1930    Clinical Impression Statement  Patient appears to have orthostatic hypotension. He reports only change in last 8 days was starting [place]. But his "light headedness" has increased. He feels rollator walker is helpful for walking program.     Rehab Potential  Good    PT Frequency  2x / week    PT Duration  8 weeks    PT Treatment/Interventions  ADLs/Self Care Home Management;DME Instruction;Gait training;Stair training;Functional mobility training;Therapeutic activities;Therapeutic exercise;Balance training;Neuromuscular re-education;Cognitive remediation;Patient/family education    PT Next Visit Plan  Instruct in rollator on ramps & curbs, balance on compliant surfaces, LE strengthening, endurance.    Consulted and Agree with Plan of Care  Patient       Patient will benefit from skilled therapeutic intervention in order to improve the following deficits and impairments:  Abnormal gait, Decreased activity tolerance, Decreased balance, Decreased endurance, Decreased mobility, Decreased strength, Dizziness, Postural dysfunction  Visit Diagnosis: Unsteadiness on feet  Other abnormalities of gait and mobility  Dizziness and giddiness  Repeated falls  Muscle  weakness (generalized)     Problem  List Patient Active Problem List   Diagnosis Date Noted  . Paroxysmal atrial fibrillation ([place]) 10/04/2017  . Syncope due to orthostatic hypotension 10/04/2017  . Exertional dyspnea 10/04/2017  . Healthcare maintenance 09/22/2017  . History of radiation exposure 08/17/2017  . Alcohol use disorder, severe, dependence ([place]) 08/03/2017  . Major depressive disorder, recurrent severe without psychotic features ([place]) 08/03/2017  . Alcohol abuse 12/21/2016  . Alcohol-induced mood disorder ([place]) 12/20/2016  . TIA (transient ischemic attack) 11/17/2016  . Essential hypertension 11/17/2016  . 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 Alcoholism ([place]) 06/20/2016  . Hypoglycemia 04/05/2016  . Diarrhea 11/25/2015  . Flank pain 11/25/2015  . Lower back pain 11/25/2015  . Loss of weight 11/17/2015  . Late syphilis   . HIV disease ([place]) 11/12/2014  . Bipolar 1 disorder ([place]) 05/15/2012  . 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 Elevated fasting blood sugar 05/15/2012  . H/O surgical procedure 05/15/2012  . Lung mass 05/15/2012  . History of surgical procedure 05/15/2012    Raihana Balderrama PT, DPT 11/29/2017, 12:09 PM  [facility] [address] [place], [place], [address] Phone: [phone]   Fax:  [phone]  Name: Willem Klingensmith MRN: [id number] Date of Birth: 01/31/1956

## 2017-11-30 ENCOUNTER — Ambulatory Visit: Payer: No Typology Code available for payment source | Admitting: Physical Therapy

## 2017-11-30 ENCOUNTER — Ambulatory Visit: Payer: Self-pay | Admitting: Physician Assistant

## 2017-11-30 ENCOUNTER — Encounter: Payer: Self-pay | Admitting: Cardiology

## 2017-11-30 ENCOUNTER — Ambulatory Visit (INDEPENDENT_AMBULATORY_CARE_PROVIDER_SITE_OTHER): Payer: No Typology Code available for payment source | Admitting: Cardiology

## 2017-11-30 ENCOUNTER — Encounter: Payer: Self-pay | Admitting: Physical Therapy

## 2017-11-30 VITALS — BP 114/76 | HR 74 | Ht 70.0 in | Wt 191.0 lb

## 2017-11-30 VITALS — BP 118/83 | HR 75

## 2017-11-30 DIAGNOSIS — R2681 Unsteadiness on feet: Secondary | ICD-10-CM

## 2017-11-30 DIAGNOSIS — R296 Repeated falls: Secondary | ICD-10-CM

## 2017-11-30 DIAGNOSIS — I480 Paroxysmal atrial fibrillation: Secondary | ICD-10-CM

## 2017-11-30 DIAGNOSIS — M6281 Muscle weakness (generalized): Secondary | ICD-10-CM

## 2017-11-30 DIAGNOSIS — I2510 Atherosclerotic heart disease of native coronary artery without angina pectoris: Secondary | ICD-10-CM

## 2017-11-30 DIAGNOSIS — R42 Dizziness and giddiness: Secondary | ICD-10-CM

## 2017-11-30 DIAGNOSIS — R2689 Other abnormalities of gait and mobility: Secondary | ICD-10-CM

## 2017-11-30 NOTE — Patient Instructions (Signed)
Medication Instructions:  Continue current medications  If you need a refill on your cardiac medications before your next appointment, please call your pharmacy.  Labwork: None Ordered   Testing/Procedures: None Ordered  Special Instructions:  Happy Holidays!!!  Follow-Up: Your physician wants you to follow-up in: Afib Clinic.    Thank you for choosing [facility]!!

## 2017-12-01 ENCOUNTER — Telehealth: Payer: Self-pay | Admitting: Emergency Medicine

## 2017-12-01 ENCOUNTER — Ambulatory Visit (INDEPENDENT_AMBULATORY_CARE_PROVIDER_SITE_OTHER): Payer: No Typology Code available for payment source | Admitting: Physician Assistant

## 2017-12-01 ENCOUNTER — Encounter: Payer: Self-pay | Admitting: Physician Assistant

## 2017-12-01 VITALS — BP 106/58 | HR 62 | Ht 70.0 in | Wt 192.0 lb

## 2017-12-01 DIAGNOSIS — Z7901 Long term (current) use of anticoagulants: Secondary | ICD-10-CM

## 2017-12-01 DIAGNOSIS — Z1211 Encounter for screening for malignant neoplasm of colon: Secondary | ICD-10-CM

## 2017-12-01 DIAGNOSIS — Z01818 Encounter for other preprocedural examination: Secondary | ICD-10-CM

## 2017-12-01 MED ORDER — PEG-KCL-NACL-NASULF-NA ASC-C 140 G PO SOLR: 1.0000 | ORAL | 0 refills | Status: DC

## 2017-12-01 NOTE — Patient Instructions (Signed)

## 2017-12-01 NOTE — Progress Notes (Signed)
Agree with assessment and plan as outlined.  

## 2017-12-01 NOTE — Progress Notes (Signed)
Chief Complaint: Consult for surveillance colonoscopy in a patient on chronic anticoagulation  HPI:    [name] is a 61 y/o Caucasian male with past medical history as listed below including AFib on Xarelto,  who was referred to me by Arthur [name], Parth, MD for a consult of a surveillance colonoscopy .     Patient tells me today that he has no GI complaints. He reports a colonoscopy about 25 yrs ago in [place], but he called for records and they had no record of it, per patient.  He does report a finding of polyps.  Patient describes some intermittent constipation which is cleared with use of a stool softener.   Patient also report that his weight can fluctuate up to 30# without him trying, most recently he has put on a lot of weight.     Most recently, about two weeks ago pt was placed on Xarelto for his AFib.     Patient denies fever, chills, weight loss, fatigue, anorexia, nuasea, vomiting, heartburn, reflux, abdominal pain or change in bowel habits.  Past Medical History:  Diagnosis Date  . Alcohol abuse 12/21/2016  . Bipolar disorder (HCC)   . Depression   . History of blood transfusion 12-12-1998  . History of radiation exposure 08/17/2017  . HTN (hypertension) 11/17/2016  . 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 Late syphilis   . Lower back pain 11/25/2015  . 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 TIA (transient ischemic attack) 11/17/2016    Past Surgical History:  Procedure Laterality Date  . broken right leg Right 2000  . [facility]  2012   [facility]    Current Outpatient Medications  Medication Sig Dispense Refill  . ARIPiprazole (ABILIFY) 15 MG tablet Take 15 mg by mouth daily.    . bictegravir-emtricitabine-tenofovir AF (BIKTARVY) 50-200-25 MG TABS tablet Take 1 tablet by mouth daily. 30 tablet 11  . Cholecalciferol (VITAMIN D) 2000 units CAPS Take 1 capsule by mouth daily.    . clonazePAM (KLONOPIN) 0.5 MG tablet Take 0.5 mg by mouth 3  times/day as needed-between meals & bedtime.     . Cyanocobalamin (VITAMIN B 12 PO) Take 1,000 mg by mouth every other day.    . docusate sodium (COLACE) 100 MG capsule Take 1 capsule (100 mg total) by mouth daily. (May purchase from over the counter at the pharmacy): For constipation (Patient taking differently: Take 100 mg by mouth daily as needed for mild constipation. (May purchase from over the counter at the pharmacy): For constipation) 1 capsule 0  . DULoxetine (CYMBALTA) 60 MG capsule TAKE 2 CAPSULES(120 MG) BY MOUTH DAILY 60 capsule 3  . ferrous sulfate 325 (65 FE) MG EC tablet Take 325 mg by mouth daily.    . hydrocortisone cream 1 % Apply topically 2 (two) times daily as needed for itching. 30 g 0  . hydrOXYzine (ATARAX/VISTARIL) 50 MG tablet Take 50 mg by mouth at bedtime.    . metoprolol tartrate (LOPRESSOR) 50 MG tablet Take 0.5 tablets (25 mg total) by mouth 2 (two) times daily. 60 tablet 1  . mirtazapine (REMERON) 15 MG tablet Take 1 tablet (15 mg total) by mouth at bedtime. For depression/sleep 30 tablet 5  . prazosin (MINIPRESS) 2 MG capsule Take 2 mg by mouth at bedtime.    . rivaroxaban (XARELTO) 20 MG TABS tablet Take 1 tablet (20 mg total) by mouth daily with supper. 30 tablet 11  . rosuvastatin (CRESTOR) 10 MG tablet Take  1 tablet (10 mg total) by mouth at bedtime. For high cholesterol 30 tablet 0  . sildenafil (VIAGRA) 100 MG tablet Take 1 tablet (100 mg total) by mouth daily as needed for erectile dysfunction. 30 tablet 5  . tamsulosin (FLOMAX) 0.4 MG CAPS capsule Take 1 capsule (0.4 mg total) by mouth 2 (two) times daily. For prostate health 30 capsule 0  . topiramate (TOPAMAX) 50 MG tablet Take 50 mg by mouth daily.     No current facility-administered medications for this visit.     Allergies as of 12/01/2017  . (No Known Allergies)    Family History  Problem Relation Age of Onset  . Heart disease Mother [id number]       MI age 62.  Died age 62  . Stroke Mother   .  Heart disease Father        Died age 5270s MI  . Heart disease Sister        No details    Social History   Socioeconomic History  . Marital status: Single    Spouse name: Not on file  . Number of children: Not on file  . Years of education: Not on file  . Highest education level: Not on file  Social Needs  . Financial resource strain: Not on file  . Food insecurity - worry: Not on file  . Food insecurity - inability: Not on file  . Transportation needs - medical: Not on file  . Transportation needs - non-medical: Not on file  Occupational History  . Not on file  Tobacco Use  . Smoking status: Never Smoker  . Smokeless tobacco: Never Used  Substance and Sexual Activity  . Alcohol use: No    Comment: no EtOH in 20 days 09/26/17  . Drug use: No  . Sexual activity: Not Currently    Partners: Male  Other Topics Concern  . 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    Review of Systems:    Constitutional: No weight loss, fever, chills, weakness or fatigue Skin: No rash or itching Cardiovascular: No chest pain, chest pressure or palpitations   Respiratory: No SOB or cough Gastrointestinal: See HPI and otherwise negative Genitourinary: No dysuria or change in urinary frequency Neurological: No headache, dizziness or syncope Musculoskeletal: No new muscle or joint pain Hematologic: No bleeding or bruising Psychiatric: No history of depression or anxiety    Physical Exam:  Vital signs: BP (!) 106/58   Pulse 62   Ht 5\' 10"  (1.778 m)   Wt 192 lb (87.1 kg)   BMI 27.55 kg/m   Constitutional:   Pleasant Caucasian male appears to be in NAD, Well developed, Well nourished, alert and cooperative Head:  Normocephalic and atraumatic. Eyes:   PEERL, EOMI. No icterus. Conjunctiva pink. Ears:  Normal auditory acuity. Neck:  Supple Throat: Oral cavity and pharynx without inflammation, swelling or lesion.  Respiratory: Respirations even and unlabored. Lungs  clear to auscultation bilaterally.   No wheezes, crackles, or rhonchi.  Cardiovascular: Normal S1, S2. No MRG. Regular rate and rhythm. No peripheral edema, cyanosis or pallor.  Gastrointestinal:  Soft, nondistended, nontender. No rebound or guarding. Normal bowel sounds. No appreciable masses or hepatomegaly. Rectal:  Not performed.  Msk:  Symmetrical without gross deformities. Without edema, no deformity or joint abnormality.  Walks with a cane Neurologic:  Alert and  oriented x4;  grossly normal neurologically.  Skin:   Dry and intact without significant lesions or  rashes. Psychiatric: Demonstrates good judgement and reason without abnormal affect or behaviors.  RELEVANT LABS AND IMAGING: CBC    Component Value Date/Time   WBC 6.4 10/04/2017 1621   RBC 4.13 (L) 10/04/2017 1621   HGB 13.6 10/04/2017 1621   HCT 40.0 10/04/2017 1621   PLT 211 10/04/2017 1621   MCV 96.9 10/04/2017 1621   MCH 32.9 10/04/2017 1621   MCHC 34.0 10/04/2017 1621   RDW 13.6 10/04/2017 1621   LYMPHSABS 2.2 05/31/2016 1150   MONOABS 0.5 05/31/2016 1150   EOSABS 0.1 05/31/2016 1150   BASOSABS 0.0 05/31/2016 1150    CMP     Component Value Date/Time   NA 137 10/04/2017 1621   K 4.3 10/04/2017 1621   CL 110 10/04/2017 1621   CO2 23 10/04/2017 1621   GLUCOSE 94 10/04/2017 1621   BUN 10 10/04/2017 1621   CREATININE 1.11 10/04/2017 1621   CREATININE 0.93 06/08/2017 0815   CALCIUM 8.5 (L) 10/04/2017 1621   PROT 6.5 08/02/2017 1915   ALBUMIN 3.9 08/02/2017 1915   AST 23 08/02/2017 1915   ALT 20 08/02/2017 1915   ALKPHOS 61 08/02/2017 1915   BILITOT 0.5 08/02/2017 1915   GFRNONAA >60 10/04/2017 1621   GFRNONAA >89 05/25/2016 0927   GFRAA >60 10/04/2017 1621   GFRAA >89 05/25/2016 0927    Assessment: 1. Surveillance Colonoscopy:  Pt reports colo 25 yrs ago wit finding of polyps, no record anwywhere to be found per pt 2. Chronic Anticoagulation: placed on Xarelto 2 weeks ago for his h/o A  Fib  Plan: 1. Patient is scheduled for Colo with Dr. Adela Lank in [facility] on Jan 7th. Discussed risks, benefits, limitations and alternatives and the patient agrees to proceed. 2. We will contact Dr. Antoine Poche to ensure that holding his Xarelto for 2 days is acceptable. If not we will have to reschedule colo 3. Patient to follow in clinic per recs from Dr. Adela Lank after time of procedure  Hyacinth Meeker, PA-C [facility] 12/01/2017, 1:44 PM  Cc: Arthur Lever, MD

## 2017-12-01 NOTE — Telephone Encounter (Signed)
   Arthur [name] Apr 18, 1956 [id number]  Dear Dr. Antoine PocheHochrein:  We have scheduled the above named patient for an colonoscopy procedure. Our records show that he was just started on anticoagulation therapy for A fib.  Please advise as to whether the patient may come off their therapy of Xarelto 2 days prior to their procedure which is scheduled for 12-18-17.  Please route your response to Deneise Leveresiree Melrose Kearse, CMA  Sincerely,    [facility]

## 2017-12-01 NOTE — Therapy (Signed)
[facility] [address] [place], [place], [address] Phone: [phone]   Fax:  [phone]  Physical Therapy Treatment  Patient Details  Name: Arthur [name] MRN: [id number] Date of Birth: [birth year] Referring Provider: Burgess Estelle, MD   Encounter Date: 11/30/2017  PT End of Session - 11/30/17 1411    Visit Number  6    Number of Visits  17    Authorization Type  100% coverage with financial aide    PT Start Time  0370    PT Stop Time  1445    PT Time Calculation (min)  41 min    Equipment Utilized During Treatment  Gait belt    Activity Tolerance  Patient tolerated treatment well    Behavior During Therapy  [facility] for tasks assessed/performed       Past Medical History:  Diagnosis Date  . Alcohol abuse 12/21/2016  . Bipolar disorder ([place])   . Depression   . History of blood transfusion 12-12-1998  . History of radiation exposure 08/17/2017  . HTN (hypertension) 11/17/2016  . 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 Late syphilis   . Lower back pain 11/25/2015  . Recurrent major depression-severe ([place]) 06/20/2016  . TIA (transient ischemic attack) 11/17/2016    Past Surgical History:  Procedure Laterality Date  . broken right leg Right 2000  . GASTRIC BYPASS  2012   [facility]    Vitals:   11/30/17 1415  BP: 118/83  Pulse: 75    Subjective Assessment - 11/30/17 1408    Subjective  Continues to be light headed. Saw Dr. Percival Spanish today. He is referring him to the A-fib clinic to start a new medication for A-fib.     Pertinent History  alcohol use disorder, bipolar, HM, HTN, late syphilis, TIA, LBP,     Limitations  Walking;Writing    Patient Stated Goals  To improve balance to stop falling or loosing balance    Currently in Pain?  No/denies           [facility] Adult PT Treatment/Exercise - 11/30/17 1415      Transfers   Transfers  Sit to Stand;Stand to Sit    Sit to Stand  5:  Supervision;Without upper extremity assist;From chair/3-in-1    Stand to Sit  5: Supervision;Without upper extremity assist;To chair/3-in-1      Ambulation/Gait   Ambulation/Gait  Yes    Ambulation/Gait Assistance  5: Supervision    Ambulation/Gait Assistance Details  reminder cues for posture and hand postion on brakes of rollator    Ambulation Distance (Feet)  650 Feet x1 w/rollator    Assistive device  Rollator;Straight cane    Gait Pattern  Step-through pattern;Trunk flexed    Ambulation Surface  Level;Indoor    Ramp  Other (comment) min guard assist    Ramp Details (indicate cue type and reason)  x2 with rollator, cues on use of brakes with ramp management    Curb  Other (comment) min guard assist    Pre-Gait Activities  x 2 with rollator, cues on sequencing and use of brakes with curb management      High Level Balance   High Level Balance Activities  Marching forwards;Marching backwards tandem fwd/bwd    High Level Balance Comments  both red mats next to counter top with intermittent UE support: 3 laps each/each way with min guard to min assist for balance. cues on posture and ex form/technique.  PT Short Term Goals - 11/23/17 1410      PT SHORT TERM GOAL #1   Title  Patient demonstrates understanding of intial HEP. (All STGs Target Date: 11/15/2017)    Baseline  11/23/17: met today    Status  Achieved      PT SHORT TERM GOAL #2   Title  Berg Balance >45/56    Baseline  11/23/17: 49/56 today    Time  --    Period  --    Status  Achieved      PT SHORT TERM GOAL #3   Title  Patient ambulates with cane 500' iwth supervision.     Baseline  11/23/17: met today     Time  --    Period  --    Status  Achieved        PT Long Term Goals - 11/07/17 2202      PT LONG TERM GOAL #1   Title  Patient verbalizes & demonstrates understanding of ongoing HEP / fitness plan.  (All LTGs Target Date: 12/16/2017)    Time  8    Period  Weeks    Status   On-going    Target Date  12/16/17      PT LONG TERM GOAL #2   Title  Berg Balance >48/56     Time  8    Period  Weeks    Status  On-going    Target Date  12/16/17      PT LONG TERM GOAL #3   Title  Functional Gait Assessment >/= 19/30    Time  8    Period  Weeks    Status  On-going    Target Date  12/16/17      PT LONG TERM GOAL #4   Title  Patient ambulates 1000' outdoor surfaces including grass, ramps, curbs with cane or less modified independently.     Time  8    Period  Weeks    Status  On-going    Target Date  12/16/17          Plan - 11/30/17 1411    Clinical Impression Statement  Today's skilled session continued to focus on use rollator with mobility and high level balance on complaint surfaces. Pt is making steady progress toward goals and should benefit from continued PT to progress toward unmet goals.     Rehab Potential  Good    PT Frequency  2x / week    PT Duration  8 weeks    PT Treatment/Interventions  ADLs/Self Care Home Management;DME Instruction;Gait training;Stair training;Functional mobility training;Therapeutic activities;Therapeutic exercise;Balance training;Neuromuscular re-education;Cognitive remediation;Patient/family education    PT Next Visit Plan  balance on compliant surfaces, LE strengthening, endurance.    Consulted and Agree with Plan of Care  Patient       Patient will benefit from skilled therapeutic intervention in order to improve the following deficits and impairments:  Abnormal gait, Decreased activity tolerance, Decreased balance, Decreased endurance, Decreased mobility, Decreased strength, Dizziness, Postural dysfunction  Visit Diagnosis: Unsteadiness on feet  Other abnormalities of gait and mobility  Repeated falls  Muscle weakness (generalized)  Dizziness and giddiness     Problem List Patient Active Problem List   Diagnosis Date Noted  . Paroxysmal atrial fibrillation ([place]) 10/04/2017  . Syncope due to  orthostatic hypotension 10/04/2017  . Exertional dyspnea 10/04/2017  . Healthcare maintenance 09/22/2017  . History of radiation exposure 08/17/2017  . Alcohol use disorder, severe, dependence ([place]) 08/03/2017  .  Major depressive disorder, recurrent severe without psychotic features ([place]) 08/03/2017  . Alcohol abuse 12/21/2016  . Alcohol-induced mood disorder ([place]) 12/20/2016  . TIA (transient ischemic attack) 11/17/2016  . Essential hypertension 11/17/2016  . 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 Alcoholism ([place]) 06/20/2016  . Hypoglycemia 04/05/2016  . Diarrhea 11/25/2015  . Flank pain 11/25/2015  . Lower back pain 11/25/2015  . Loss of weight 11/17/2015  . Late syphilis   . HIV disease ([place]) 11/12/2014  . Bipolar 1 disorder ([place]) 05/15/2012  . 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 Elevated fasting blood sugar 05/15/2012  . H/O surgical procedure 05/15/2012  . Lung mass 05/15/2012  . History of surgical procedure 05/15/2012    Willow Ora, PTA, [facility] [address], [place], [place] [address] [phone] 12/01/17, 11:44 AM   Name: Arthur [name] MRN: [id number] Date of Birth: 03/31/1956

## 2017-12-04 NOTE — Telephone Encounter (Signed)
We are in the process of possibly admitting this patient for drug therapy and cardioversion.  If this is an elective colonoscopy I would suggest deferring it as he has symptomatic atrial fib and should not delay cardiac therapy.

## 2017-12-06 ENCOUNTER — Ambulatory Visit: Payer: No Typology Code available for payment source | Admitting: Physical Therapy

## 2017-12-06 ENCOUNTER — Encounter: Payer: Self-pay | Admitting: Physical Therapy

## 2017-12-06 DIAGNOSIS — R2689 Other abnormalities of gait and mobility: Secondary | ICD-10-CM

## 2017-12-06 DIAGNOSIS — M6281 Muscle weakness (generalized): Secondary | ICD-10-CM

## 2017-12-06 DIAGNOSIS — R2681 Unsteadiness on feet: Secondary | ICD-10-CM

## 2017-12-06 NOTE — Telephone Encounter (Signed)
Spoke to patient and canceled procedure. Pt verbalized understanding.

## 2017-12-06 NOTE — Therapy (Signed)
[facility] [address] [place], [place], [address] Phone: [phone]   Fax:  [phone]  Physical Therapy Treatment  Patient Details  Name: Arthur [name] MRN: [id number] Date of Birth: Sep 30, 1956 Referring Provider: Burgess Estelle, MD   Encounter Date: 12/06/2017  PT End of Session - 12/06/17 1623    Visit Number  7    Number of Visits  17    Authorization Type  100% coverage with financial aide    PT Start Time  4315    PT Stop Time  1658    PT Time Calculation (min)  39 min    Equipment Utilized During Treatment  Gait belt    Activity Tolerance  Patient tolerated treatment well    Behavior During Therapy  [facility] for tasks assessed/performed       Past Medical History:  Diagnosis Date  . Alcohol abuse 12/21/2016  . Bipolar disorder ([place])   . Depression   . History of blood transfusion 12-12-1998  . History of radiation exposure 08/17/2017  . HTN (hypertension) 11/17/2016  . 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 Late syphilis   . Lower back pain 11/25/2015  . Recurrent major depression-severe ([place]) 06/20/2016  . TIA (transient ischemic attack) 11/17/2016    Past Surgical History:  Procedure Laterality Date  . broken right leg Right 2000  . GASTRIC BYPASS  2012   [facility]    There were no vitals filed for this visit.  Subjective Assessment - 12/06/17 1621    Subjective  Reports the lightheadedness is better, none today. Also he found a rollator at [facility], however the brakes do not work. He thinks it's the pads and that he can fix it. Also, goes into the hospital on 12/14/16 to start the new med for A-fib, going there so he can be monitored while starting the medication.     Pertinent History  alcohol use disorder, bipolar, HM, HTN, late syphilis, TIA, LBP,     Limitations  Walking;Writing    Patient Stated Goals  To improve balance to stop falling or loosing balance    Currently in Pain?   No/denies         [facility] Adult PT Treatment/Exercise - 12/06/17 1624      Transfers   Transfers  Sit to Stand;Stand to Sit    Sit to Stand  6: Modified independent (Device/Increase time)    Stand to Sit  6: Modified independent (Device/Increase time)      Ambulation/Gait   Ambulation/Gait  Yes    Ambulation/Gait Assistance  5: Supervision    Ambulation/Gait Assistance Details  no cues or assistance needed    Ambulation Distance (Feet)  500 Feet    Assistive device  Rollator;Straight cane    Gait Pattern  Step-through pattern;Trunk flexed    Ambulation Surface  Level;Indoor;Unlevel;Outdoor;Paved          Balance Exercises - 12/06/17 1634      Balance Exercises: Standing   SLS with Vectors  Foam/compliant surface;Other reps (comment);Limitations    Rockerboard  Anterior/posterior;Lateral;Head turns;EC;30 seconds;10 reps    Balance Beam  on blue balance beam: side stepping left<>right, then tandem gait fwd/bwd, 3 laps each with light UE support on bars, min guard assist with cues for posture and ex form/technique.       Balance Exercises: Standing   SLS with Vectors Limitations  2 tall cones on blue mat: alt fwd toe taps, alt cross toe taps, alt fwd double toe  taps, alt cross double toe taps. 10 reps each bil LE's with cues on posture, stance position and weight shifting to assist with balance. min guard to min assist for balance as well.      Rebounder Limitations  performed both ways on balance board:  EO rocking board with emphasis on tall posture, progressing to EC no head movements, progressing to EC head movements left<>right, then up<>down. no to light intermittent UE touch to bars for balance, cues on posture and weight shifitng to assist with balance as well.           PT Short Term Goals - 11/23/17 1410      PT SHORT TERM GOAL #1   Title  Patient demonstrates understanding of intial HEP. (All STGs Target Date: 11/15/2017)    Baseline  11/23/17: met today    Status   Achieved      PT SHORT TERM GOAL #2   Title  Berg Balance >45/56    Baseline  11/23/17: 49/56 today    Time  --    Period  --    Status  Achieved      PT SHORT TERM GOAL #3   Title  Patient ambulates with cane 500' iwth supervision.     Baseline  11/23/17: met today     Time  --    Period  --    Status  Achieved        PT Long Term Goals - 11/07/17 2202      PT LONG TERM GOAL #1   Title  Patient verbalizes & demonstrates understanding of ongoing HEP / fitness plan.  (All LTGs Target Date: 12/16/2017)    Time  8    Period  Weeks    Status  On-going    Target Date  12/16/17      PT LONG TERM GOAL #2   Title  Berg Balance >48/56     Time  8    Period  Weeks    Status  On-going    Target Date  12/16/17      PT LONG TERM GOAL #3   Title  Functional Gait Assessment >/= 19/30    Time  8    Period  Weeks    Status  On-going    Target Date  12/16/17      PT LONG TERM GOAL #4   Title  Patient ambulates 1000' outdoor surfaces including grass, ramps, curbs with cane or less modified independently.     Time  8    Period  Weeks    Status  On-going    Target Date  12/16/17         Plan - 12/06/17 1623    Clinical Impression Statement  Today's skilled session continued to focus on rollator use on outdoor surfaces and high level balance activities. Pt is making steady progress toward goals and should benefit from continued PT to progress toward unmet goals.     Rehab Potential  Good    PT Frequency  2x / week    PT Duration  8 weeks    PT Treatment/Interventions  ADLs/Self Care Home Management;DME Instruction;Gait training;Stair training;Functional mobility training;Therapeutic activities;Therapeutic exercise;Balance training;Neuromuscular re-education;Cognitive remediation;Patient/family education    PT Next Visit Plan  balance on compliant surfaces, LE strengthening, endurance.    Consulted and Agree with Plan of Care  Patient       Patient will benefit from skilled  therapeutic intervention in order to improve the following  deficits and impairments:  Abnormal gait, Decreased activity tolerance, Decreased balance, Decreased endurance, Decreased mobility, Decreased strength, Dizziness, Postural dysfunction  Visit Diagnosis: Unsteadiness on feet  Other abnormalities of gait and mobility  Muscle weakness (generalized)     Problem List Patient Active Problem List   Diagnosis Date Noted  . Paroxysmal atrial fibrillation ([place]) 10/04/2017  . Syncope due to orthostatic hypotension 10/04/2017  . Exertional dyspnea 10/04/2017  . Healthcare maintenance 09/22/2017  . History of radiation exposure 08/17/2017  . Alcohol use disorder, severe, dependence ([place]) 08/03/2017  . Major depressive disorder, recurrent severe without psychotic features ([place]) 08/03/2017  . Alcohol abuse 12/21/2016  . Alcohol-induced mood disorder ([place]) 12/20/2016  . TIA (transient ischemic attack) 11/17/2016  . Essential hypertension 11/17/2016  . 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 Alcoholism ([place]) 06/20/2016  . Hypoglycemia 04/05/2016  . Diarrhea 11/25/2015  . Flank pain 11/25/2015  . Lower back pain 11/25/2015  . Loss of weight 11/17/2015  . Late syphilis   . HIV disease ([place]) 11/12/2014  . Bipolar 1 disorder ([place]) 05/15/2012  . 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 Elevated fasting blood sugar 05/15/2012  . H/O surgical procedure 05/15/2012  . Lung mass 05/15/2012  . History of surgical procedure 05/15/2012   Willow Ora, PTA, [facility] [address] [place], [place] [address] [phone] 12/06/17, 5:08 PM  Name: Arthur [name] MRN: [id number] Date of Birth: August 06, 1956

## 2017-12-07 ENCOUNTER — Other Ambulatory Visit: Payer: No Typology Code available for payment source

## 2017-12-07 DIAGNOSIS — A529 Late syphilis, unspecified: Secondary | ICD-10-CM

## 2017-12-07 DIAGNOSIS — F332 Major depressive disorder, recurrent severe without psychotic features: Secondary | ICD-10-CM

## 2017-12-07 DIAGNOSIS — Z9884 Bariatric surgery status: Secondary | ICD-10-CM

## 2017-12-07 DIAGNOSIS — F319 Bipolar disorder, unspecified: Secondary | ICD-10-CM

## 2017-12-07 DIAGNOSIS — G479 Sleep disorder, unspecified: Secondary | ICD-10-CM

## 2017-12-07 DIAGNOSIS — W19XXXA Unspecified fall, initial encounter: Secondary | ICD-10-CM

## 2017-12-07 DIAGNOSIS — F333 Major depressive disorder, recurrent, severe with psychotic symptoms: Secondary | ICD-10-CM

## 2017-12-07 DIAGNOSIS — F1010 Alcohol abuse, uncomplicated: Secondary | ICD-10-CM

## 2017-12-07 DIAGNOSIS — I25758 Atherosclerosis of native coronary artery of transplanted heart with other forms of angina pectoris: Secondary | ICD-10-CM

## 2017-12-07 DIAGNOSIS — F1094 Alcohol use, unspecified with alcohol-induced mood disorder: Secondary | ICD-10-CM

## 2017-12-07 DIAGNOSIS — E162 Hypoglycemia, unspecified: Secondary | ICD-10-CM

## 2017-12-07 DIAGNOSIS — F1011 Alcohol abuse, in remission: Secondary | ICD-10-CM

## 2017-12-07 DIAGNOSIS — B20 Human immunodeficiency virus [HIV] disease: Secondary | ICD-10-CM

## 2017-12-08 ENCOUNTER — Ambulatory Visit: Payer: No Typology Code available for payment source | Admitting: Physical Therapy

## 2017-12-08 LAB — COMPLETE METABOLIC PANEL WITH GFR
AG Ratio: 1.9 (calc) (ref 1.0–2.5)
ALBUMIN MSPROF: 4 g/dL (ref 3.6–5.1)
ALT: 18 U/L (ref 9–46)
AST: 30 U/L (ref 10–35)
Alkaline phosphatase (APISO): 61 U/L (ref 40–115)
BUN: 12 mg/dL (ref 7–25)
CALCIUM: 8.9 mg/dL (ref 8.6–10.3)
CO2: 26 mmol/L (ref 20–32)
CREATININE: 1 mg/dL (ref 0.70–1.25)
Chloride: 106 mmol/L (ref 98–110)
GFR, EST NON AFRICAN AMERICAN: 80 mL/min/{1.73_m2} (ref >=60)
GFR, Est African American: 93 mL/min/{1.73_m2} (ref >=60)
GLOBULIN: 2.1 g/dL (ref 1.9–3.7)
GLUCOSE: 93 mg/dL (ref 65–99)
Potassium: 4.8 mmol/L (ref 3.5–5.3)
SODIUM: 138 mmol/L (ref 135–146)
Total Bilirubin: 0.6 mg/dL (ref 0.2–1.2)
Total Protein: 6.1 g/dL (ref 6.1–8.1)

## 2017-12-08 LAB — CBC WITH DIFFERENTIAL/PLATELET
Basophils Absolute: 31 cells/uL (ref 0–200)
Basophils Relative: 0.6 %
EOS PCT: 1.9 %
Eosinophils Absolute: 99 cells/uL (ref 15–500)
HEMATOCRIT: 40.6 % (ref 38.5–50.0)
HEMOGLOBIN: 13.6 g/dL (ref 13.2–17.1)
LYMPHS ABS: 2491 {cells}/uL (ref 850–3900)
MCH: 32.5 pg (ref 27.0–33.0)
MCHC: 33.5 g/dL (ref 32.0–36.0)
MCV: 97.1 fL (ref 80.0–100.0)
MPV: 9.8 fL (ref 7.5–12.5)
Monocytes Relative: 9.8 %
NEUTROS ABS: 2070 {cells}/uL (ref 1500–7800)
Neutrophils Relative %: 39.8 %
Platelets: 239 10*3/uL (ref 140–400)
RBC: 4.18 10*6/uL — AB (ref 4.20–5.80)
RDW: 12.7 % (ref 11.0–15.0)
Total Lymphocyte: 47.9 %
WBC mixed population: 510 cells/uL (ref 200–950)
WBC: 5.2 10*3/uL (ref 3.8–10.8)

## 2017-12-08 LAB — LIPID PANEL
CHOL/HDL RATIO: 2.5 (calc) (ref <5.0)
CHOLESTEROL: 154 mg/dL (ref <200)
HDL: 61 mg/dL (ref >40)
LDL CHOLESTEROL (CALC): 74 mg/dL
Non-HDL Cholesterol (Calc): 93 mg/dL (calc) (ref <130)
TRIGLYCERIDES: 111 mg/dL (ref <150)

## 2017-12-08 LAB — RPR: RPR Ser Ql: NONREACTIVE

## 2017-12-08 LAB — T-HELPER CELL (CD4) - (RCID CLINIC ONLY)
CD4 T CELL HELPER: 33 % (ref 33–55)
CD4 T Cell Abs: 870 /uL (ref 400–2700)

## 2017-12-09 LAB — HIV-1 RNA QUANT-NO REFLEX-BLD
HIV 1 RNA QUANT: DETECTED {copies}/mL — AB
HIV-1 RNA Quant, Log: <1.3 Log copies/mL — AB

## 2017-12-13 ENCOUNTER — Ambulatory Visit
Payer: No Typology Code available for payment source | Attending: Student in an Organized Health Care Education/Training Program | Admitting: Physical Therapy

## 2017-12-13 ENCOUNTER — Encounter: Payer: Self-pay | Admitting: Physical Therapy

## 2017-12-13 DIAGNOSIS — R2681 Unsteadiness on feet: Secondary | ICD-10-CM | POA: Insufficient documentation

## 2017-12-13 DIAGNOSIS — M6281 Muscle weakness (generalized): Secondary | ICD-10-CM | POA: Insufficient documentation

## 2017-12-13 DIAGNOSIS — R42 Dizziness and giddiness: Secondary | ICD-10-CM | POA: Insufficient documentation

## 2017-12-13 DIAGNOSIS — R296 Repeated falls: Secondary | ICD-10-CM

## 2017-12-13 DIAGNOSIS — R2689 Other abnormalities of gait and mobility: Secondary | ICD-10-CM | POA: Insufficient documentation

## 2017-12-14 ENCOUNTER — Ambulatory Visit (HOSPITAL_COMMUNITY)
Admission: RE | Admit: 2017-12-14 | Discharge: 2017-12-14 | Disposition: A | Discharge status: Home / Self Care | Payer: No Typology Code available for payment source | Source: Ambulatory Visit | Attending: Nurse Practitioner | Admitting: Nurse Practitioner

## 2017-12-14 ENCOUNTER — Encounter (HOSPITAL_COMMUNITY): Payer: Self-pay | Admitting: Nurse Practitioner

## 2017-12-14 VITALS — BP 94/62 | HR 81 | Ht 70.0 in | Wt 193.8 lb

## 2017-12-14 DIAGNOSIS — R55 Syncope and collapse: Secondary | ICD-10-CM | POA: Insufficient documentation

## 2017-12-14 DIAGNOSIS — I480 Paroxysmal atrial fibrillation: Secondary | ICD-10-CM

## 2017-12-14 DIAGNOSIS — Z7901 Long term (current) use of anticoagulants: Secondary | ICD-10-CM | POA: Insufficient documentation

## 2017-12-14 DIAGNOSIS — Z8673 Personal history of transient ischemic attack (TIA), and cerebral infarction without residual deficits: Secondary | ICD-10-CM | POA: Insufficient documentation

## 2017-12-14 DIAGNOSIS — Z79899 Other long term (current) drug therapy: Secondary | ICD-10-CM | POA: Insufficient documentation

## 2017-12-14 DIAGNOSIS — I10 Essential (primary) hypertension: Secondary | ICD-10-CM | POA: Insufficient documentation

## 2017-12-14 NOTE — Addendum Note (Signed)
Addended by: Kandice RobinsonsYOUNG, [place] Catlin T on: 12/14/2017 09:19 AM   Modules accepted: Orders

## 2017-12-14 NOTE — Progress Notes (Signed)
Primary Care Physician: Deneise LeverSaraiya, Parth, MD Referring Physician: [facility] ER f/u Cardiologist: Dr. Anselm PancoastHochrein   Arthur [name] is a 62 y.o. 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 Stood up to go to kitchen and collapsed/passed out. He did not seek attention for it at the time. He presented for his stress test and was in afib. Dr. Mayford Knifeurner was contacted and advised for him to go to ER. He was rate controlled in the  ER and it was decided w/u would be on outpatient  Basis. Syncopal spell  was thought 2/2 to be orthostatic in nature  from  afib with RVR and HCTZ. This was stopped and he was started on metoprolol for afib rate control.  He followed up with Dr. Antoine PocheHochrein after testing was done. A previous cardiac [facility], [facility] as part of a clinic trial, was found to have 50% LAD stenosis with "blooming artifact".   He had a [employer] Myoview and this was negative for ischemia . He had a normal echocardiogram. Pending sleep study.  When he last saw Dr. Antoine PocheHochrein, 12/20, he described  afib episodes every day which is felt  to be contributing to fatigue. Due to his current drug interactions, flecainide nor Tikosyn can be used and Dr. Antoine PocheHochrein sent him here to discuss  hospitalization for sotalol.   Today, he denies symptoms of palpitations, chest pain, shortness of breath, orthopnea, PND, lower extremity edema, dizziness, presyncope, syncope, or neurologic sequela. The patient is tolerating medications without difficulties and is otherwise without complaint today.   Past Medical History:  Diagnosis Date  . Alcohol abuse 12/21/2016  . Bipolar disorder (HCC)   . Depression   . History of blood transfusion 12-12-1998  . History of radiation exposure 08/17/2017  . HTN (hypertension) 11/17/2016  . Hypoglycemia after GI (gastrointestinal) surgery  06/20/2016  . Late syphilis   . Lower back pain 11/25/2015  . 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 TIA (transient ischemic attack) 11/17/2016   Past Surgical History:  Procedure Laterality Date  . broken right leg Right 2000  . [facility]    Current Outpatient Medications  Medication Sig Dispense Refill  . ARIPiprazole (ABILIFY) 15 MG tablet Take 15 mg by mouth daily.    . bictegravir-emtricitabine-tenofovir AF (BIKTARVY) 50-200-25 MG TABS tablet Take 1 tablet by mouth daily. 30 tablet 11  . Cholecalciferol (VITAMIN D) 2000 units CAPS Take 1 capsule by mouth daily.    . clonazePAM (KLONOPIN) 0.5 MG tablet Take 0.5 mg by mouth 3 times/day as needed-between meals & bedtime.     . Cyanocobalamin (VITAMIN B 12 PO) Take 1,000 mg by mouth every other day.    . docusate sodium (COLACE) 100 MG capsule Take 1 capsule (100 mg total) by mouth daily. (May purchase from over the counter at the pharmacy): For constipation (Patient taking differently: Take 100 mg by mouth daily as needed for mild constipation. (May purchase from over the counter at the pharmacy): For constipation) 1 capsule 0  . DULoxetine (CYMBALTA) 60 MG capsule TAKE 2 CAPSULES(120 MG) BY MOUTH DAILY 60 capsule 3  . ferrous sulfate 325 (65 FE) MG EC tablet Take 325 mg by mouth daily.    . hydrocortisone cream 1 % Apply topically 2 (two) times daily as  needed for itching. 30 g 0  . hydrOXYzine (ATARAX/VISTARIL) 50 MG tablet Take 50 mg by mouth at bedtime.    . metoprolol tartrate (LOPRESSOR) 50 MG tablet Take 0.5 tablets (25 mg total) by mouth 2 (two) times daily. 60 tablet 1  . mirtazapine (REMERON) 15 MG tablet Take 1 tablet (15 mg total) by mouth at bedtime. For depression/sleep 30 tablet 5  . prazosin (MINIPRESS) 2 MG capsule Take 2 mg by mouth at bedtime.    . rivaroxaban (XARELTO) 20 MG TABS tablet Take 1 tablet (20 mg total) by mouth daily with supper. 30 tablet 11  . rosuvastatin (CRESTOR) 10 MG  tablet Take 1 tablet (10 mg total) by mouth at bedtime. For high cholesterol 30 tablet 0  . sildenafil (VIAGRA) 100 MG tablet Take 1 tablet (100 mg total) by mouth daily as needed for erectile dysfunction. 30 tablet 5  . tamsulosin (FLOMAX) 0.4 MG CAPS capsule Take 1 capsule (0.4 mg total) by mouth 2 (two) times daily. For prostate health 30 capsule 0  . topiramate (TOPAMAX) 50 MG tablet Take 50 mg by mouth daily.     No current facility-administered medications for this encounter.     No Known Allergies  Social History   Socioeconomic History  . Marital status: Single    Spouse name: Not on file  . Number of children: Not on file  . Years of education: Not on file  . Highest education level: Not on file  Social Needs  . Financial resource strain: Not on file  . Food insecurity - worry: Not on file  . Food insecurity - inability: Not on file  . Transportation needs - medical: Not on file  . Transportation needs - non-medical: Not on file  Occupational History  . Not on file  Tobacco Use  . Smoking status: Never Smoker  . Smokeless tobacco: Never Used  Substance and Sexual Activity  . Alcohol use: No    Comment: no EtOH in 20 days 09/26/17  . Drug use: No  . Sexual activity: Not Currently    Partners: Male  Other Topics Concern  . 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    Family History  Problem Relation Age of Onset  . Heart disease Mother 53       MI age 71.  Died age 88  . Stroke Mother   . Heart disease Father        Died age 79s MI  . Heart disease Sister        No details    ROS- All systems are reviewed and negative except as per the HPI above  Physical Exam: Vitals:   12/14/17 1534  BP: 94/62  Pulse: 81  Weight: 193 lb 12.8 oz (87.9 kg)  Height: 5\' 10"  (1.778 m)   Wt Readings from Last 3 Encounters:  12/14/17 193 lb 12.8 oz (87.9 kg)  12/01/17 192 lb (87.1 kg)  11/30/17 191 lb (86.6 kg)    Labs: Lab Results    Component Value Date   NA 138 12/07/2017   K 4.8 12/07/2017   CL 106 12/07/2017   CO2 26 12/07/2017   GLUCOSE 93 12/07/2017   BUN 12 12/07/2017   CREATININE 1.00 12/07/2017   CALCIUM 8.9 12/07/2017   PHOS 3.1 11/16/2015   MG 2.1 10/04/2017   Lab Results  Component Value Date   INR 1.03 05/31/2016   Lab Results  Component Value Date  CHOL 154 12/07/2017   HDL 61 12/07/2017   LDLCALC 47 05/25/2016   TRIG 111 12/07/2017     GEN- The patient is well appearing, alert and oriented x 3 today.   Head- normocephalic, atraumatic Eyes-  Sclera clear, conjunctiva pink Ears- hearing intact Oropharynx- clear Neck- supple, no JVP Lymph- no cervical lymphadenopathy Lungs- Clear to ausculation bilaterally, normal work of breathing Heart- Regular rate and rhythm, no murmurs, rubs or gallops, PMI not laterally displaced GI- soft, NT, ND, + BS Extremities- no clubbing, cyanosis, or edema MS- no significant deformity or atrophy Skin- no rash or lesion Psych- euthymic mood, full affect Neuro- strength and sensation are intact  EKG- NSR at 81 bpm, pr int 156 ms, qrs int 82  ms, qtc 434 ms    Assessment and Plan: 1. Paroxysmal afib Discussed general procedure for hospitalization for sotalol Last checked K+/mag levels are acceptable for use of sotalol Qtc is acceptable at 434 ms Continue metoprolol at 1/2 tab bid Triggers  also discussed  Chadsvasc score of 3,if you consider the TIA that he had,  possibly an embolic phenomenon.  He has not missed any doses of xarelto in the last 3 weeks He does not use benadryl Can afford drug as it is generic and inexpensive  PharmD to review drugs for potential qtc prolonging drugs  2. Syncope Has not had any reoccurrence  Thought secondary to orthostatic changes  on HCTZ and demands of afib Now off on hctz Reminded to stay hydrated  Plan on bringing him back on Monday, 1/7, to admit for sotalol, pt wants to proceed  Lupita Leash C. Matthew Folks [facility] [address] [place], [place] [address] [phone]

## 2017-12-14 NOTE — Therapy (Signed)
[facility] [address] [place], [place], [address] Phone: [phone]   Fax:  [phone]  Physical Therapy Treatment  Patient Details  Name: Arthur [name] MRN: [id number] Date of Birth: [birth year] Referring Provider: Burgess Estelle, MD   Encounter Date: 12/13/2017  PT End of Session - 12/13/17 [place]    Visit Number  8    Number of Visits  17    Date for PT Re-Evaluation  01/05/18    Authorization Type  100% coverage with financial aide    PT Start Time  6962    PT Stop Time  1620    PT Time Calculation (min)  45 min    Equipment Utilized During Treatment  Gait belt    Activity Tolerance  Patient tolerated treatment well    Behavior During Therapy  [facility] for tasks assessed/performed       Past Medical History:  Diagnosis Date  . Alcohol abuse 12/21/2016  . Bipolar disorder ([place])   . Depression   . History of blood transfusion 12-12-1998  . History of radiation exposure 08/17/2017  . HTN (hypertension) 11/17/2016  . 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 Late syphilis   . Lower back pain 11/25/2015  . Recurrent major depression-severe ([place]) 06/20/2016  . TIA (transient ischemic attack) 11/17/2016    Past Surgical History:  Procedure Laterality Date  . broken right leg Right 2000  . GASTRIC BYPASS  2012   [facility]    There were no vitals filed for this visit.  Subjective Assessment - 12/13/17 1538    Subjective  No falls. Balance is still a big issue. He is doing the exercises when weather is good.     Pertinent History  alcohol use disorder, bipolar, HM, HTN, late syphilis, TIA, LBP,     Limitations  Walking;Writing    Patient Stated Goals  To improve balance to stop falling or loosing balance    Currently in Pain?  No/denies                      [facility] Adult PT Treatment/Exercise - 12/13/17 1535      Exercises   Exercises  Knee/Hip      Knee/Hip Exercises: Aerobic    Nustep  PT instructed in benefits & set-up of NuStep with recommendation to use seated back support (recumbent) that uses BUEs & BLEs. Pt performed level 3 for 6 minutes with cues on full ROM & pace.          Balance Exercises - 12/13/17 1535      Balance Exercises: Standing   Rockerboard  Anterior/posterior;Lateral;Head turns;EC;10 seconds;10 reps;Intermittent UE support stabilization, head turns EO, static EC, tilting & recovery    Balance Beam  Foam beam standing crossways: head turns EO & alternate LE step forward with stabilization /balance 5 seconds anteriorly & posteriorly.         PT Education - 12/13/17 1600    Education provided  Yes    Education Details  [employer] fitness room & classes (SAIL/balance, chair yoga & basic TiaChi)    Person(s) Educated  Patient    Methods  Explanation;Verbal cues;Handout info from internet    Comprehension  Verbalized understanding       PT Short Term Goals - 11/23/17 1410      PT SHORT TERM GOAL #1   Title  Patient demonstrates understanding of intial HEP. (All STGs Target Date: 11/15/2017)    Baseline  11/23/17: met today    Status  Achieved      PT SHORT TERM GOAL #2   Title  Berg Balance >45/56    Baseline  11/23/17: 49/56 today    Time  --    Period  --    Status  Achieved      PT SHORT TERM GOAL #3   Title  Patient ambulates with cane 500' iwth supervision.     Baseline  11/23/17: met today     Time  --    Period  --    Status  Achieved        PT Long Term Goals - 12/13/17 1901      PT LONG TERM GOAL #1   Title  Patient verbalizes & demonstrates understanding of ongoing HEP / fitness plan.  (All LTGs Target Date: 01/09/2018)    Time  8    Period  Weeks    Status  On-going    Target Date  01/09/18      PT LONG TERM GOAL #2   Title  Berg Balance >48/56     Time  8    Period  Weeks    Status  On-going    Target Date  01/09/18      PT LONG TERM GOAL #3   Title  Functional Gait Assessment >/= 19/30     Time  8    Period  Weeks    Status  On-going    Target Date  01/09/18      PT LONG TERM GOAL #4   Title  Patient ambulates 1000' outdoor surfaces including grass, ramps, curbs with cane or less modified independently.     Time  8    Period  Weeks    Status  On-going    Target Date  01/09/18            Plan - 12/13/17 1904    Clinical Impression Statement  Patient missed 3 weeks of plan of care due to weather & holidays. PT reset LTG target date to include the missed 3 weeks of plan of care as he would benefit from care to meet targeted LTGs. Patient appears to understand benefits of seated endurance with NuStep which is available at [facility].     Rehab Potential  Good    PT Frequency  2x / week    PT Duration  8 weeks    PT Treatment/Interventions  ADLs/Self Care Home Management;DME Instruction;Gait training;Stair training;Functional mobility training;Therapeutic activities;Therapeutic exercise;Balance training;Neuromuscular re-education;Cognitive remediation;Patient/family education    PT Next Visit Plan  balance on compliant surfaces, LE strengthening, endurance.    Consulted and Agree with Plan of Care  Patient       Patient will benefit from skilled therapeutic intervention in order to improve the following deficits and impairments:  Abnormal gait, Decreased activity tolerance, Decreased balance, Decreased endurance, Decreased mobility, Decreased strength, Dizziness, Postural dysfunction  Visit Diagnosis: Unsteadiness on feet  Other abnormalities of gait and mobility  Muscle weakness (generalized)  Repeated falls  Dizziness and giddiness     Problem List Patient Active Problem List   Diagnosis Date Noted  . Paroxysmal atrial fibrillation ([place]) 10/04/2017  . Syncope due to orthostatic hypotension 10/04/2017  . Exertional dyspnea 10/04/2017  . Healthcare maintenance 09/22/2017  . History of radiation exposure 08/17/2017  . Alcohol use disorder,  severe, dependence ([place]) 08/03/2017  . Major depressive disorder, recurrent severe without psychotic features ([place]) 08/03/2017  . Alcohol abuse  12/21/2016  . Alcohol-induced mood disorder ([place]) 12/20/2016  . TIA (transient ischemic attack) 11/17/2016  . Essential hypertension 11/17/2016  . 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 Alcoholism ([place]) 06/20/2016  . Hypoglycemia 04/05/2016  . Diarrhea 11/25/2015  . Flank pain 11/25/2015  . Lower back pain 11/25/2015  . Loss of weight 11/17/2015  . Late syphilis   . HIV disease ([place]) 11/12/2014  . Bipolar 1 disorder ([place]) 05/15/2012  . 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 Elevated fasting blood sugar 05/15/2012  . H/O surgical procedure 05/15/2012  . Lung mass 05/15/2012  . History of surgical procedure 05/15/2012    Nykerria Macconnell PT, DPT 12/14/2017, 7:06 PM  [facility] [address] [place], [place], [address] Phone: [phone]   Fax:  [phone]  Name: Arthur [name] MRN: [id number] Date of Birth: [birth year]

## 2017-12-15 ENCOUNTER — Ambulatory Visit: Payer: No Typology Code available for payment source | Admitting: Physical Therapy

## 2017-12-15 ENCOUNTER — Encounter: Payer: Self-pay | Admitting: Physical Therapy

## 2017-12-15 ENCOUNTER — Telehealth: Payer: Self-pay | Admitting: Pharmacist

## 2017-12-15 DIAGNOSIS — M6281 Muscle weakness (generalized): Secondary | ICD-10-CM

## 2017-12-15 DIAGNOSIS — R2689 Other abnormalities of gait and mobility: Secondary | ICD-10-CM

## 2017-12-15 DIAGNOSIS — R42 Dizziness and giddiness: Secondary | ICD-10-CM

## 2017-12-15 DIAGNOSIS — R2681 Unsteadiness on feet: Secondary | ICD-10-CM

## 2017-12-15 NOTE — Telephone Encounter (Signed)
Medication list reviewed in anticipation of upcoming sotalol initiation. Patient is not taking any contraindicated medications, however he does take hydroxyzine and mirtazapine, both of which are QTc prolonging medications. Pt's QTc at baseline is < 440msec, will need to monitor closely. Sotalol can also increase the risk of hypotension when used with many of pt's current medications. He has been hypotensive recently and BP will also need to be monitored closely.  Patient is anticoagulated on Xarelto 20mg  daily on the appropriate dose (CrCl is 7494mL/min). Please ensure that patient has not missed any anticoagulation doses in the 3 weeks prior to sotalol initiation.

## 2017-12-17 NOTE — Therapy (Signed)
[facility] [address] [place], [place], [address] Phone: [phone]   Fax:  [phone]  Physical Therapy Treatment  Patient Details  Name: Arthur [name] MRN: [id number] Date of Birth: 1956-03-06 Referring Provider: Burgess Estelle, MD   Encounter Date: 12/15/2017   12/15/17 1408  PT Visits / Re-Eval  Visit Number 9  Number of Visits 17  Date for PT Re-Evaluation 01/05/18  Authorization  Authorization Type 100% coverage with financial aide  PT Time Calculation  PT Start Time 4315  PT Stop Time 1444  PT Time Calculation (min) 39 min  PT - End of Session  Equipment Utilized During Treatment Gait belt  Activity Tolerance Patient tolerated treatment well  Behavior During Therapy [facility] for tasks assessed/performed     Past Medical History:  Diagnosis Date  . Alcohol abuse 12/21/2016  . Bipolar disorder ([place])   . Depression   . History of blood transfusion 12-12-1998  . History of radiation exposure 08/17/2017  . HTN (hypertension) 11/17/2016  . 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 Late syphilis   . Lower back pain 11/25/2015  . Recurrent major depression-severe ([place]) 06/20/2016  . TIA (transient ischemic attack) 11/17/2016    Past Surgical History:  Procedure Laterality Date  . broken right leg Right 2000  . GASTRIC BYPASS  2012   [facility]    There were no vitals filed for this visit.     12/15/17 1407  Symptoms/Limitations  Subjective No new complaints. No falls or pain to report. Did see Cardiology office yesterday and may be going into hospital Monday to start the new a-fib medication. Will call and let us know about appt's next week.   Pertinent History alcohol use disorder, bipolar, HM, HTN, late syphilis, TIA, LBP,   Limitations Walking;Writing  Patient Stated Goals To improve balance to stop falling or loosing balance  Pain Assessment  Currently in Pain? No/denies       12/15/17 1409  High Level Balance  High Level Balance Activities Marching forwards;Marching backwards;Tandem walking (tandem fwd/bwd, )  High Level Balance Comments both red mats next to counter top with occasional touch to counter for balance recovery: 3 laps each/each way with min guard/min for balance. cues on posture and ex form/technique.                      Knee/Hip Exercises: Aerobic  Nustep UE/LE's level 3 fo 6 minutes with goal >/= 45 steps/minute. pt able to recall set up from previous session with cues for posture only during course of ride       12/15/17 1431  Balance Exercises: Standing  SLS with Vectors Solid surface;Foam/compliant surface;Limitations  Wall Bumps Hip  Wall Bumps-Hips Eyes opened;Anterior/posterior;Foam/compliant surface;10 reps;Limitations  Balance Exercises: Standing  SLS with Vectors Limitations circle dots in star pattern x 8 points: toe taps toe each circle x 1 lap each leg on floor, then progressed to single leg stance on 1/4 inch this foam for 3 laps each. min guard to min assist for balance  Wall Bumps Limitations on floor, then progressing to on air ex for 10 reps, cues on form and technique with min guard assist for balance.         PT Short Term Goals - 11/23/17 1410      PT SHORT TERM GOAL #1   Title  Patient demonstrates understanding of intial HEP. (All STGs Target Date: 11/15/2017)    Baseline  11/23/17: met today  Status  Achieved      PT SHORT TERM GOAL #2   Title  Berg Balance >45/56    Baseline  11/23/17: 49/56 today    Time  --    Period  --    Status  Achieved      PT SHORT TERM GOAL #3   Title  Patient ambulates with cane 500' iwth supervision.     Baseline  11/23/17: met today     Time  --    Period  --    Status  Achieved        PT Long Term Goals - 12/13/17 1901      PT LONG TERM GOAL #1   Title  Patient verbalizes & demonstrates understanding of ongoing HEP / fitness plan.  (All LTGs Target Date: 01/09/2018)     Time  8    Period  Weeks    Status  On-going    Target Date  01/09/18      PT LONG TERM GOAL #2   Title  Berg Balance >48/56     Time  8    Period  Weeks    Status  On-going    Target Date  01/09/18      PT LONG TERM GOAL #3   Title  Functional Gait Assessment >/= 19/30    Time  8    Period  Weeks    Status  On-going    Target Date  01/09/18      PT LONG TERM GOAL #4   Title  Patient ambulates 1000' outdoor surfaces including grass, ramps, curbs with cane or less modified independently.     Time  8    Period  Weeks    Status  On-going    Target Date  01/09/18         12/15/17 1408  Plan  Clinical Impression Statement Today's skilled session continued to focus on high level balance and activity tolerance with no issues reported. Pt is making steady progress toward goals and should benefit from continued PT to progress toward unmet goals.   Pt will benefit from skilled therapeutic intervention in order to improve on the following deficits Abnormal gait;Decreased activity tolerance;Decreased balance;Decreased endurance;Decreased mobility;Decreased strength;Dizziness;Postural dysfunction  Rehab Potential Good  PT Frequency 2x / week  PT Duration 8 weeks  PT Treatment/Interventions ADLs/Self Care Home Management;DME Instruction;Gait training;Stair training;Functional mobility training;Therapeutic activities;Therapeutic exercise;Balance training;Neuromuscular re-education;Cognitive remediation;Patient/family education  PT Next Visit Plan balance on compliant surfaces, LE strengthening, endurance.  Consulted and Agree with Plan of Care Patient      Patient will benefit from skilled therapeutic intervention in order to improve the following deficits and impairments:  Abnormal gait, Decreased activity tolerance, Decreased balance, Decreased endurance, Decreased mobility, Decreased strength, Dizziness, Postural dysfunction  Visit Diagnosis: Unsteadiness on feet  Other  abnormalities of gait and mobility  Muscle weakness (generalized)  Dizziness and giddiness     Problem List Patient Active Problem List   Diagnosis Date Noted  . Paroxysmal atrial fibrillation ([place]) 10/04/2017  . Syncope due to orthostatic hypotension 10/04/2017  . Exertional dyspnea 10/04/2017  . Healthcare maintenance 09/22/2017  . History of radiation exposure 08/17/2017  . Alcohol use disorder, severe, dependence ([place]) 08/03/2017  . Major depressive disorder, recurrent severe without psychotic features ([place]) 08/03/2017  . Alcohol abuse 12/21/2016  . Alcohol-induced mood disorder ([place]) 12/20/2016  . TIA (transient ischemic attack) 11/17/2016  . Essential hypertension 11/17/2016  . 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  Alcoholism ([place]) 06/20/2016  . Hypoglycemia 04/05/2016  . Diarrhea 11/25/2015  . Flank pain 11/25/2015  . Lower back pain 11/25/2015  . Loss of weight 11/17/2015  . Late syphilis   . HIV disease ([place]) 11/12/2014  . Bipolar 1 disorder ([place]) 05/15/2012  . 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 Elevated fasting blood sugar 05/15/2012  . H/O surgical procedure 05/15/2012  . Lung mass 05/15/2012  . History of surgical procedure 05/15/2012    Willow Ora, PTA, [facility] [address] [place], [place] [address] [phone] 12/17/17, 12:09 AM   Name: Arthur [name] MRN: [id number] Date of Birth: Oct 30, 1956

## 2017-12-18 ENCOUNTER — Ambulatory Visit: Payer: No Typology Code available for payment source | Admitting: Physical Therapy

## 2017-12-18 ENCOUNTER — Ambulatory Visit (HOSPITAL_COMMUNITY)
Admission: RE | Admit: 2017-12-18 | Discharge: 2017-12-18 | Disposition: A | Discharge status: Home / Self Care | Payer: No Typology Code available for payment source | Source: Ambulatory Visit | Attending: Nurse Practitioner | Admitting: Nurse Practitioner

## 2017-12-18 ENCOUNTER — Encounter: Payer: Self-pay | Admitting: Gastroenterology

## 2017-12-18 ENCOUNTER — Other Ambulatory Visit: Payer: Self-pay

## 2017-12-18 ENCOUNTER — Encounter (HOSPITAL_COMMUNITY): Payer: Self-pay

## 2017-12-18 ENCOUNTER — Encounter (HOSPITAL_COMMUNITY): Payer: Self-pay | Admitting: Nurse Practitioner

## 2017-12-18 ENCOUNTER — Inpatient Hospital Stay (HOSPITAL_COMMUNITY)
Admission: AD | Admit: 2017-12-18 | Discharge: 2017-12-21 | DRG: 310 | Disposition: A | Discharge status: Home / Self Care | Payer: No Typology Code available for payment source | Source: Ambulatory Visit | Attending: Cardiology | Admitting: Cardiology

## 2017-12-18 VITALS — BP 112/82 | HR 79 | Ht 70.0 in | Wt 195.6 lb

## 2017-12-18 DIAGNOSIS — Z9884 Bariatric surgery status: Secondary | ICD-10-CM

## 2017-12-18 DIAGNOSIS — I10 Essential (primary) hypertension: Secondary | ICD-10-CM | POA: Diagnosis present

## 2017-12-18 DIAGNOSIS — Z8659 Personal history of other mental and behavioral disorders: Secondary | ICD-10-CM

## 2017-12-18 DIAGNOSIS — E7800 Pure hypercholesterolemia, unspecified: Secondary | ICD-10-CM | POA: Diagnosis present

## 2017-12-18 DIAGNOSIS — F319 Bipolar disorder, unspecified: Secondary | ICD-10-CM | POA: Diagnosis present

## 2017-12-18 DIAGNOSIS — Z21 Asymptomatic human immunodeficiency virus [HIV] infection status: Secondary | ICD-10-CM | POA: Diagnosis present

## 2017-12-18 DIAGNOSIS — Z5181 Encounter for therapeutic drug level monitoring: Secondary | ICD-10-CM

## 2017-12-18 DIAGNOSIS — I952 Hypotension due to drugs: Secondary | ICD-10-CM | POA: Diagnosis present

## 2017-12-18 DIAGNOSIS — Z8619 Personal history of other infectious and parasitic diseases: Secondary | ICD-10-CM

## 2017-12-18 DIAGNOSIS — T502X5A Adverse effect of carbonic-anhydrase inhibitors, benzothiadiazides and other diuretics, initial encounter: Secondary | ICD-10-CM | POA: Diagnosis present

## 2017-12-18 DIAGNOSIS — R001 Bradycardia, unspecified: Secondary | ICD-10-CM | POA: Diagnosis not present

## 2017-12-18 DIAGNOSIS — Z7901 Long term (current) use of anticoagulants: Secondary | ICD-10-CM

## 2017-12-18 DIAGNOSIS — Z79899 Other long term (current) drug therapy: Secondary | ICD-10-CM

## 2017-12-18 DIAGNOSIS — F419 Anxiety disorder, unspecified: Secondary | ICD-10-CM | POA: Diagnosis present

## 2017-12-18 DIAGNOSIS — Z923 Personal history of irradiation: Secondary | ICD-10-CM

## 2017-12-18 DIAGNOSIS — I480 Paroxysmal atrial fibrillation: Secondary | ICD-10-CM

## 2017-12-18 DIAGNOSIS — Z8673 Personal history of transient ischemic attack (TIA), and cerebral infarction without residual deficits: Secondary | ICD-10-CM

## 2017-12-18 DIAGNOSIS — I481 Persistent atrial fibrillation: Secondary | ICD-10-CM | POA: Diagnosis present

## 2017-12-18 LAB — BASIC METABOLIC PANEL
Anion gap: 8 (ref 5–15)
BUN: 12 mg/dL (ref 6–20)
CALCIUM: 8.8 mg/dL — AB (ref 8.9–10.3)
CO2: 24 mmol/L (ref 22–32)
CREATININE: 1.35 mg/dL — AB (ref 0.61–1.24)
Chloride: 105 mmol/L (ref 101–111)
GFR calc Af Amer: >60 mL/min (ref >60)
GFR calc non Af Amer: 55 mL/min — ABNORMAL LOW (ref >60)
GLUCOSE: 103 mg/dL — AB (ref 65–99)
Potassium: 4.2 mmol/L (ref 3.5–5.1)
Sodium: 137 mmol/L (ref 135–145)

## 2017-12-18 LAB — MAGNESIUM: Magnesium: 2 mg/dL (ref 1.7–2.4)

## 2017-12-18 MED ORDER — ARIPIPRAZOLE 5 MG PO TABS
15.0000 mg | ORAL_TABLET | Freq: Every day | ORAL | Status: DC
  Administered 2017-12-19 – 2017-12-21 (×3): 15 mg via ORAL
  Filled 2017-12-18 (×3): qty 3

## 2017-12-18 MED ORDER — MIRTAZAPINE 7.5 MG PO TABS
15.0000 mg | ORAL_TABLET | Freq: Every day | ORAL | Status: DC
  Administered 2017-12-18 – 2017-12-20 (×3): 15 mg via ORAL
  Filled 2017-12-18 (×3): qty 2

## 2017-12-18 MED ORDER — SOTALOL HCL 80 MG PO TABS
80.0000 mg | ORAL_TABLET | Freq: Two times a day (BID) | ORAL | Status: DC
  Administered 2017-12-18: 80 mg via ORAL
  Filled 2017-12-18 (×2): qty 1

## 2017-12-18 MED ORDER — CLONAZEPAM 0.5 MG PO TABS
0.5000 mg | ORAL_TABLET | Freq: Three times a day (TID) | ORAL | Status: DC | PRN
  Administered 2017-12-19 – 2017-12-20 (×4): 0.5 mg via ORAL
  Filled 2017-12-18 (×4): qty 1

## 2017-12-18 MED ORDER — PRAZOSIN HCL 2 MG PO CAPS
2.0000 mg | ORAL_CAPSULE | Freq: Every day | ORAL | Status: DC
  Administered 2017-12-18 – 2017-12-20 (×3): 2 mg via ORAL
  Filled 2017-12-18 (×3): qty 1

## 2017-12-18 MED ORDER — RIVAROXABAN 20 MG PO TABS
20.0000 mg | ORAL_TABLET | Freq: Every day | ORAL | Status: DC
  Administered 2017-12-19 – 2017-12-21 (×3): 20 mg via ORAL
  Filled 2017-12-18 (×3): qty 1

## 2017-12-18 MED ORDER — TOPIRAMATE 25 MG PO TABS
50.0000 mg | ORAL_TABLET | Freq: Every day | ORAL | Status: DC
  Administered 2017-12-18 – 2017-12-20 (×3): 50 mg via ORAL
  Filled 2017-12-18 (×3): qty 2

## 2017-12-18 MED ORDER — VITAMIN D 1000 UNITS PO TABS
2000.0000 [IU] | ORAL_TABLET | Freq: Every day | ORAL | Status: DC
  Administered 2017-12-19 – 2017-12-21 (×3): 2000 [IU] via ORAL
  Filled 2017-12-18 (×5): qty 2

## 2017-12-18 MED ORDER — FERROUS SULFATE 325 (65 FE) MG PO TABS
325.0000 mg | ORAL_TABLET | Freq: Every day | ORAL | Status: DC
  Administered 2017-12-19 – 2017-12-21 (×3): 325 mg via ORAL
  Filled 2017-12-18 (×3): qty 1

## 2017-12-18 MED ORDER — TAMSULOSIN HCL 0.4 MG PO CAPS
0.4000 mg | ORAL_CAPSULE | Freq: Two times a day (BID) | ORAL | Status: DC
  Administered 2017-12-18 – 2017-12-21 (×6): 0.4 mg via ORAL
  Filled 2017-12-18 (×6): qty 1

## 2017-12-18 MED ORDER — DULOXETINE HCL 60 MG PO CPEP
120.0000 mg | ORAL_CAPSULE | Freq: Every day | ORAL | Status: DC
  Administered 2017-12-19 – 2017-12-21 (×3): 120 mg via ORAL
  Filled 2017-12-18 (×4): qty 2

## 2017-12-18 MED ORDER — HYDROXYZINE HCL 25 MG PO TABS
50.0000 mg | ORAL_TABLET | Freq: Every day | ORAL | Status: DC
  Administered 2017-12-18 – 2017-12-20 (×3): 50 mg via ORAL
  Filled 2017-12-18 (×3): qty 2

## 2017-12-18 MED ORDER — METOPROLOL TARTRATE 25 MG PO TABS
25.0000 mg | ORAL_TABLET | Freq: Two times a day (BID) | ORAL | Status: DC
  Administered 2017-12-18: 25 mg via ORAL
  Filled 2017-12-18 (×2): qty 1

## 2017-12-18 MED ORDER — ROSUVASTATIN CALCIUM 10 MG PO TABS
10.0000 mg | ORAL_TABLET | Freq: Every day | ORAL | Status: DC
  Administered 2017-12-19 – 2017-12-20 (×2): 10 mg via ORAL
  Filled 2017-12-18 (×2): qty 1

## 2017-12-18 MED ORDER — NITROGLYCERIN 0.4 MG SL SUBL: 0.4000 mg | SUBLINGUAL_TABLET | SUBLINGUAL | Status: DC | PRN

## 2017-12-18 MED ORDER — ACETAMINOPHEN 325 MG PO TABS: 650.0000 mg | ORAL_TABLET | ORAL | Status: DC | PRN

## 2017-12-18 MED ORDER — BICTEGRAVIR-EMTRICITAB-TENOFOV 50-200-25 MG PO TABS
1.0000 | ORAL_TABLET | Freq: Every day | ORAL | Status: DC
  Administered 2017-12-19 – 2017-12-21 (×3): 1 via ORAL
  Filled 2017-12-18 (×3): qty 1

## 2017-12-18 MED ORDER — VITAMIN B-12 1000 MCG PO TABS
1000.0000 ug | ORAL_TABLET | ORAL | Status: DC
  Administered 2017-12-19 – 2017-12-21 (×2): 1000 ug via ORAL
  Filled 2017-12-18 (×2): qty 1

## 2017-12-18 NOTE — Progress Notes (Signed)
Primary Care Physician: Deneise LeverSaraiya, Parth, MD Referring Physician: [facility] f/u Cardiologist: Dr. Anselm PancoastHochrein   Arthur [name] is a 62 y.o. 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 Stood up to go to kitchen and collapsed/passed out. He did not seek attention for it at the time. He presented for his stress test and was in afib. Dr. Mayford Knifeurner was contacted and advised for him to go to ER. He was rate controlled in the  ER and it was decided w/u would be on outpatient  Basis. Syncopal spell  was thought 2/2 to be orthostatic in nature  from  afib with RVR and HCTZ. This was stopped and he was started on metoprolol for afib rate control.  He followed up with Dr. Antoine PocheHochrein after testing was done. A previous cardiac [facility], [facility], was found to have 50% LAD stenosis with "blooming artifact".   He had a [employer] Myoview and this was negative for ischemia . He had a normal echocardiogram. Pending sleep study.  When he last saw Dr. Antoine PocheHochrein, 12/20, he described  afib episodes every day which is felt  to be contributing to fatigue. Due to his current drug interactions, flecainide nor Tikosyn can be used and Dr. Antoine PocheHochrein sent him here to discuss  hospitalization for sotalol.   F/u in afib clinic, 1/7, for admit to sotalol. Per Margaretmary DysMegan Supple, PharmD on review of meds prior to admit- "Medication list reviewed in anticipation of upcoming sotalol initiation. Patient is not taking any contraindicated medications, however he does take hydroxyzine and mirtazapine, both of which are QTc prolonging medications. Pt's QTc at baseline is < 440msec, will need to monitor closely. Sotalol can also increase the risk of hypotension when used with many of pt's current medications. He has been hypotensive recently and BP will also need to be  monitored closely.  He has not missed any doses of DOAC, he has been off HCTZ for greater than 3 days. He is in SR today.   Today, he denies symptoms of palpitations, chest pain, shortness of breath, orthopnea, PND, lower extremity edema, dizziness, presyncope, syncope, or neurologic sequela. The patient is tolerating medications without difficulties and is otherwise without complaint today.   Past Medical History:  Diagnosis Date  . Alcohol abuse 12/21/2016  . Bipolar disorder (HCC)   . Depression   . History of blood transfusion 12-12-1998  . History of radiation exposure 08/17/2017  . HTN (hypertension) 11/17/2016  . 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 Late syphilis   . Lower back pain 11/25/2015  . 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 TIA (transient ischemic attack) 11/17/2016   Past Surgical History:  Procedure Laterality Date  . broken right leg Right 2000  . [facility]    Current Outpatient Medications  Medication Sig Dispense Refill  . ARIPiprazole (ABILIFY) 15 MG tablet Take 15 mg by mouth daily.    . bictegravir-emtricitabine-tenofovir AF (BIKTARVY) 50-200-25 MG TABS tablet Take 1 tablet by mouth daily. 30 tablet 11  . Cholecalciferol (VITAMIN D) 2000 units CAPS Take 1 capsule by mouth daily.    . clonazePAM (KLONOPIN) 0.5 MG tablet Take 0.5 mg by mouth 3 times/day as needed-between meals & bedtime.     . Cyanocobalamin (  VITAMIN B 12 PO) Take 1,000 mg by mouth every other day.    . docusate sodium (COLACE) 100 MG capsule Take 1 capsule (100 mg total) by mouth daily. (May purchase from over the counter at the pharmacy): For constipation (Patient taking differently: Take 100 mg by mouth daily as needed for mild constipation. (May purchase from over the counter at the pharmacy): For constipation) 1 capsule 0  . DULoxetine (CYMBALTA) 60 MG capsule TAKE 2 CAPSULES(120 MG) BY MOUTH DAILY 60 capsule 3  . ferrous sulfate 325  (65 FE) MG EC tablet Take 325 mg by mouth daily.    . hydrocortisone cream 1 % Apply topically 2 (two) times daily as needed for itching. 30 g 0  . hydrOXYzine (ATARAX/VISTARIL) 50 MG tablet Take 50 mg by mouth at bedtime.    . mirtazapine (REMERON) 15 MG tablet Take 1 tablet (15 mg total) by mouth at bedtime. For depression/sleep 30 tablet 5  . prazosin (MINIPRESS) 2 MG capsule Take 2 mg by mouth at bedtime.    . rivaroxaban (XARELTO) 20 MG TABS tablet Take 1 tablet (20 mg total) by mouth daily with supper. 30 tablet 11  . rosuvastatin (CRESTOR) 10 MG tablet Take 1 tablet (10 mg total) by mouth at bedtime. For high cholesterol 30 tablet 0  . sildenafil (VIAGRA) 100 MG tablet Take 1 tablet (100 mg total) by mouth daily as needed for erectile dysfunction. 30 tablet 5  . tamsulosin (FLOMAX) 0.4 MG CAPS capsule Take 1 capsule (0.4 mg total) by mouth 2 (two) times daily. For prostate health 30 capsule 0  . topiramate (TOPAMAX) 50 MG tablet Take 50 mg by mouth daily.    . metoprolol tartrate (LOPRESSOR) 50 MG tablet Take 0.5 tablets (25 mg total) by mouth 2 (two) times daily. 60 tablet 1   No current facility-administered medications for this encounter.     No Known Allergies  Social History   Socioeconomic History  . Marital status: Single    Spouse name: Not on file  . Number of children: Not on file  . Years of education: Not on file  . Highest education level: Not on file  Social Needs  . Financial resource strain: Not on file  . Food insecurity - worry: Not on file  . Food insecurity - inability: Not on file  . Transportation needs - medical: Not on file  . Transportation needs - non-medical: Not on file  Occupational History  . Not on file  Tobacco Use  . Smoking status: Never Smoker  . Smokeless tobacco: Never Used  Substance and Sexual Activity  . Alcohol use: No    Comment: no EtOH in 20 days 09/26/17  . Drug use: No  . Sexual activity: Not Currently    Partners: Male    Other Topics Concern  . 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    Family History  Problem Relation Age of Onset  . Heart disease Mother 64       MI age 48.  Died age 14  . Stroke Mother   . Heart disease Father        Died age 34s MI  . Heart disease Sister        No details    ROS- All systems are reviewed and negative except as per the HPI above  Physical Exam: Vitals:   12/18/17 1141  BP: 112/82  Pulse: 79  Weight: 195 lb 9.6 oz (88.7  kg)  Height: 5\' 10"  (1.778 m)   Wt Readings from Last 3 Encounters:  12/18/17 195 lb 9.6 oz (88.7 kg)  12/14/17 193 lb 12.8 oz (87.9 kg)  12/01/17 192 lb (87.1 kg)    Labs: Lab Results  Component Value Date   NA 138 12/07/2017   K 4.8 12/07/2017   CL 106 12/07/2017   CO2 26 12/07/2017   GLUCOSE 93 12/07/2017   BUN 12 12/07/2017   CREATININE 1.00 12/07/2017   CALCIUM 8.9 12/07/2017   PHOS 3.1 11/16/2015   MG 2.1 10/04/2017   Lab Results  Component Value Date   INR 1.03 05/31/2016   Lab Results  Component Value Date   CHOL 154 12/07/2017   HDL 61 12/07/2017   LDLCALC 47 05/25/2016   TRIG 111 12/07/2017     GEN- The patient is well appearing, alert and oriented x 3 today.   Head- normocephalic, atraumatic Eyes-  Sclera clear, conjunctiva pink Ears- hearing intact Oropharynx- clear Neck- supple, no JVP Lymph- no cervical lymphadenopathy Lungs- Clear to ausculation bilaterally, normal work of breathing Heart- Regular rate and rhythm, no murmurs, rubs or gallops, PMI not laterally displaced GI- soft, NT, ND, + BS Extremities- no clubbing, cyanosis, or edema MS- no significant deformity or atrophy Skin- no rash or lesion Psych- euthymic mood, full affect Neuro- strength and sensation are intact  EKG- NSR at 81 bpm, pr int 156 ms, qrs int 82  ms, qtc 434 ms    Assessment and Plan: 1. Symptomatic paroxysmal afib Admission pending today for sotalol, interaction with current drugs and  tikosyn General procedure for hospitalization for sotalol reviewed Bmet/mag shows acceptable labs today with K+ at 4.2, mag at 2.0 and crcl cal at 70.98 ml/min Qtc is acceptable at 433 ms Continue metoprolol at 1/2 tab bid, may have to stopped with addition of sotalol Triggers  also discussed  Chadsvasc score of 3,if you consider the TIA that he had,  possibly an embolic phenomenon.  He has not missed any doses of xarelto in the last 3 weeks He does not use benadryl Can afford drug as it is generic and inexpensive  PharmD to reviewed drugs (see HPI for comments)  2. Syncope Has not had any reoccurrence  Thought secondary to orthostatic changes  on HCTZ and demands of afib Now off on hctz Reminded to stay hydrated  Admit for sotalol  Lupita Leash C. Matthew Folks [facility] [address] [place], [place] [address] [phone]

## 2017-12-18 NOTE — Progress Notes (Signed)
Primary Care Physician: Deneise Lever, MD Referring Physician: [facility] ER f/u Cardiologist: Dr. Anselm Pancoast Arthur [name] is a 62 y.o. 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 Stood up to go to kitchen and collapsed/passed out. He did not seek attention for it at the time. He presented for his stress test and was in afib. Dr. Mayford Knife was contacted and advised for him to go to ER. He was rate controlled in the  ER and it was decided w/u would be on outpatient  Basis. Syncopal spell  was thought 2/2 to be orthostatic in nature  from  afib with RVR and HCTZ. This was stopped and he was started on metoprolol for afib rate control.  He followed up with Dr. Antoine Poche after testing was done. A previous cardiac CTA, that was done at [facility] as part of a clinic trial, was found to have 50% LAD stenosis with "blooming artifact".   He had a Arthur [name] and this was negative for ischemia . He had a normal echocardiogram. Pending sleep study.  When he last saw Dr. Antoine Poche, 12/20, he described  afib episodes every day which is felt  to be contributing to fatigue. Due to his current drug interactions, flecainide nor Tikosyn can be used and Dr. Antoine Poche sent him here to discuss  hospitalization for sotalol.   F/u in afib clinic, 1/7, for admit to sotalol. Per Arthur [name], PharmD on review of meds prior to admit- "Medication list reviewed in anticipation of upcoming sotalol initiation. Patient is not taking any contraindicated medications, however he does take hydroxyzine and mirtazapine, both of which are QTc prolonging medications. Pt's QTc at baseline is < , Arthur [name] need to monitor closely. Sotalol can also increase the risk of hypotension when used with many of pt's current medications. He has been hypotensive recently and BP Arthur [name] also need to be  monitored closely.  He has not missed any doses of DOAC, he has been off HCTZ for greater than 3 days. He is in SR today.   Today, he denies symptoms of palpitations, chest pain, shortness of breath, orthopnea, PND, lower extremity edema, dizziness, presyncope, syncope, or neurologic sequela. The patient is tolerating medications without difficulties and is otherwise without complaint today.   Past Medical History:  Diagnosis Date  . Alcohol abuse 12/21/2016  . Bipolar disorder (HCC)   . Depression   . History of blood transfusion 12-12-1998  . History of radiation exposure 08/17/2017  . HTN (hypertension) 11/17/2016  . 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 Late syphilis   . Lower back pain 11/25/2015  . 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 TIA (transient ischemic attack) 11/17/2016   Past Surgical History:  Procedure Laterality Date  . broken right leg Right 2000  . GASTRIC BYPASS  2012   [facility]    No current facility-administered medications for this encounter.     No Known Allergies  Social History   Socioeconomic History  . Marital status: Single    Spouse name: Not on file  . Number of children: Not on file  . Years of education: Not on file  . Highest education level: Not on file  Social Needs  . Financial resource strain: Not on file  . Food insecurity - worry: Not on file  . Food insecurity -  inability: Not on file  . Transportation needs - medical: Not on file  . Transportation needs - non-medical: Not on file  Occupational History  . Not on file  Tobacco Use  . Smoking status: Never Smoker  . Smokeless tobacco: Never Used  Substance and Sexual Activity  . Alcohol use: No    Comment: no EtOH in 20 days 09/26/17  . Drug use: No  . Sexual activity: Not Currently    Partners: Male  Other Topics Concern  . 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    Family History  Problem Relation Age of Onset   . Heart disease Mother 21       MI age 67.  Died age 33  . Stroke Mother   . Heart disease Father        Died age 65s MI  . Heart disease Sister        No details    ROS- All systems are reviewed and negative except as per the HPI above  Physical Exam: Vitals:   12/18/17 1400 12/18/17 1458  BP:  136/87  Weight: 195 lb 9.6 oz (88.7 kg)   Height: 5\' 10"  (1.778 m)    Wt Readings from Last 3 Encounters:  12/18/17 195 lb 9.6 oz (88.7 kg)  12/18/17 195 lb 9.6 oz (88.7 kg)  12/14/17 193 lb 12.8 oz (87.9 kg)    Labs: Lab Results  Component Value Date   NA 137 12/18/2017   K 4.2 12/18/2017   CL 105 12/18/2017   CO2 24 12/18/2017   GLUCOSE 103 (H) 12/18/2017   BUN 12 12/18/2017   CREATININE 1.35 (H) 12/18/2017   CALCIUM 8.8 (L) 12/18/2017   PHOS 3.1 11/16/2015   MG 2.0 12/18/2017   Lab Results  Component Value Date   INR 1.03 05/31/2016   Lab Results  Component Value Date   CHOL 154 12/07/2017   HDL 61 12/07/2017   LDLCALC 47 05/25/2016   TRIG 111 12/07/2017     GEN- The patient is well appearing, alert and oriented x 3 today.   Head- normocephalic, atraumatic Eyes-  Sclera clear, conjunctiva pink Ears- hearing intact Oropharynx- clear Neck- supple, no JVP Lymph- no cervical lymphadenopathy Lungs- Clear to ausculation bilaterally, normal work of breathing Heart- Regular rate and rhythm, no murmurs, rubs or gallops, PMI not laterally displaced GI- soft, NT, ND, + BS Extremities- no clubbing, cyanosis, or edema MS- no significant deformity or atrophy Skin- no rash or lesion Psych- euthymic mood, full affect Neuro- strength and sensation are intact  EKG- NSR at 81 bpm, pr int 156 ms, qrs int 82  ms, qtc 434 ms    Assessment and Plan: 1. Symptomatic paroxysmal afib Admission pending today for sotalol, interaction with current drugs and tikosyn General procedure for hospitalization for sotalol reviewed Bmet/mag shows acceptable labs today with K+ at 4.2,  mag at 2.0 and crcl cal at 70.98 ml/min Qtc is acceptable at 433 ms Continue metoprolol at 1/2 tab bid, may have to stopped with addition of sotalol Triggers  also discussed  Chadsvasc score of 3,if you consider the TIA that he had,  possibly an embolic phenomenon.  He has not missed any doses of xarelto in the last 3 weeks He does not use benadryl Can afford drug as it is generic and inexpensive  PharmD to reviewed drugs (see HPI for comments)  2. Syncope Has not had any reoccurrence  Thought secondary to orthostatic  changes  on HCTZ and demands of afib Now off on hctz Reminded to stay hydrated  Admit for sotalol  Arthur [name], ANP-C [facility] [address] [place], [place] [address] [phone]  I have seen and examined this patient with Arthur [name].  Agree with above, note added to reflect my findings.  On exam, RRR, no murmurs, lungs clear.  Admitted to the hospital with history of atrial fibrillation for sotalol loading.  Is in sinus rhythm today.  QTC is 433.  Labs without major abnormalities.  We Arthur [name] plan to start sotalol today with EKGs after each dose.  Arthur [name] M. Prestin Munch MD 12/18/2017 3:09 PM

## 2017-12-18 NOTE — Plan of Care (Signed)
Sotalol therapy has been initiated. Pt's K+ and Mag within range for administration (K+-4.2, Mag-2). QTc-448 obtained from central monitoring; pt maintaining SR in the 60's. Will obtain 3 hour post EKG at 2345. Pt's questions and concerns addressed. Will continue plan of care as ordered.

## 2017-12-19 DIAGNOSIS — Z79899 Other long term (current) drug therapy: Secondary | ICD-10-CM

## 2017-12-19 DIAGNOSIS — Z5181 Encounter for therapeutic drug level monitoring: Secondary | ICD-10-CM

## 2017-12-19 LAB — BASIC METABOLIC PANEL
ANION GAP: 7 (ref 5–15)
BUN: 13 mg/dL (ref 6–20)
CHLORIDE: 106 mmol/L (ref 101–111)
CO2: 23 mmol/L (ref 22–32)
CREATININE: 1.17 mg/dL (ref 0.61–1.24)
Calcium: 8.6 mg/dL — ABNORMAL LOW (ref 8.9–10.3)
GFR calc non Af Amer: >60 mL/min (ref >60)
GLUCOSE: 87 mg/dL (ref 65–99)
Potassium: 4 mmol/L (ref 3.5–5.1)
Sodium: 136 mmol/L (ref 135–145)

## 2017-12-19 LAB — MAGNESIUM: Magnesium: 2.1 mg/dL (ref 1.7–2.4)

## 2017-12-19 MED ORDER — METOPROLOL TARTRATE 12.5 MG HALF TABLET: 12.5000 mg | ORAL_TABLET | Freq: Two times a day (BID) | ORAL | Status: DC

## 2017-12-19 MED ORDER — SOTALOL HCL 120 MG PO TABS
120.0000 mg | ORAL_TABLET | Freq: Two times a day (BID) | ORAL | Status: DC
  Administered 2017-12-19 (×2): 120 mg via ORAL
  Filled 2017-12-19 (×3): qty 1

## 2017-12-19 NOTE — Progress Notes (Signed)
It has been noted throughout the night that pt's HR has dropped to the mid 40's. Pt was awake, alert and asymptomatic. Pt received metoprolol and sotalol last HS. HR was 65 bpm at time of administration. Will continue to monitor closely.  Viviano SimasMorgan N Eldridge Marcott, RN

## 2017-12-19 NOTE — Progress Notes (Addendum)
Progress Note  Patient Name: Arthur [name] Date of Encounter: 12/19/2017  Primary Cardiologist: Dr. Antoine PocheHochrein  Subjective   No complaints, no CP, palapitations, or SOB, tolertating medicine so far  Inpatient Medications    Scheduled Meds: . ARIPiprazole  15 mg Oral Daily  . bictegravir-emtricitabine-tenofovir AF  1 tablet Oral Daily  . cholecalciferol  2,000 Units Oral Daily  . DULoxetine  120 mg Oral Daily  . ferrous sulfate  325 mg Oral Daily  . hydrOXYzine  50 mg Oral QHS  . metoprolol tartrate  25 mg Oral BID  . mirtazapine  15 mg Oral QHS  . prazosin  2 mg Oral QHS  . rivaroxaban  20 mg Oral Q breakfast  . rosuvastatin  10 mg Oral QHS  . sotalol  120 mg Oral Q12H  . tamsulosin  0.4 mg Oral BID  . topiramate  50 mg Oral QHS  . vitamin B-12  1,000 mcg Oral QODAY   Continuous Infusions:  PRN Meds: acetaminophen, clonazePAM, nitroGLYCERIN   Vital Signs    Vitals:   12/18/17 1842 12/18/17 2048 12/18/17 2051 12/19/17 0534  BP:  129/78 129/78 (!) 121/92  Pulse:  64 66 (!) 47  Resp: 19  18 19   Temp:   98.7 F (37.1 C) 97.9 F (36.6 C)  TempSrc:   Axillary Axillary  SpO2:   100% 97%  Weight:      Height:        Intake/Output Summary (Last 24 hours) at 12/19/2017 0836 Last data filed at 12/18/2017 2057 Gross per 24 hour  Intake 460 ml  Output -  Net 460 ml   Filed Weights   12/18/17 1400  Weight: 195 lb 9.6 oz (88.7 kg)    Telemetry    SR/SB 50's-60's - Personally Reviewed  ECG    SR, QTc stable - Personally Reviewed with Dr. Elberta Fortisamnitz  Physical Exam   GEN: No acute distress.   Neck: No JVD Cardiac: RRR, no murmurs, rubs, or gallops.  Respiratory: CTA b/l. GI: Soft, nontender, non-distended  MS: No edema; No deformity. Neuro:  Nonfocal  Psych: Normal affect   Labs    Chemistry Recent Labs  Lab 12/18/17 1310 12/19/17 0558  NA 137 136  K 4.2 4.0  CL 105 106  CO2 24 23  GLUCOSE 103* 87  BUN 12 13  CREATININE 1.35* 1.17  CALCIUM 8.8*  8.6*  GFRNONAA 55* >60  GFRAA >60 >60  ANIONGAP 8 7     HematologyNo results for input(s): WBC, RBC, HGB, HCT, MCV, MCH, MCHC, RDW, PLT in the last 168 hours.  Cardiac EnzymesNo results for input(s): TROPONINI in the last 168 hours. No results for input(s): TROPIPOC in the last 168 hours.   BNPNo results for input(s): BNP, PROBNP in the last 168 hours.   DDimer No results for input(s): DDIMER in the last 168 hours.   Radiology    No results found.  Cardiac Studies   11/27/17: TTE Study Conclusions - Left ventricle: The cavity size was normal. Wall thickness was   normal. Systolic function was normal. The estimated ejection   fraction was in the range of 60% to 65%. Wall motion was normal;   there were no regional wall motion abnormalities. - Mitral valve: There was mild regurgitation. - Left atrium: The atrium was mildly dilated. - Right atrium: The atrium was mildly dilated.   Patient Profile     62 y.o. 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 Paroxysmal AFib     CHA2DS2Vasc is 3, on Xarelto     K+ 4.0     Mag 2.1     Creat 1.17     QTc stable, Arthur [name] up-titrate sotalol to 120mg  BID  2. HTN     No changes today  3. HIV+: no acute issues. Care per outpatient ID.       For questions or updates, please contact [facility] Please consult [email or web address] for contact info under Cardiology/STEMI.      Signed, Arthur Pigeon, PA-C  12/19/2017, 8:36 AM    I have seen and examined this patient with Francis Dowse.  Agree with above, note added to reflect my findings.  On exam, RRR, no murmurs, lungs clear.  First dose of sotalol 80 mg tolerated well without prolongation of QTC.  We Khyleigh Furney plan to increase dose to 120 mg twice daily today.  Raneshia Derick M. Jacari Kirsten  MD 12/19/2017 11:01 AM

## 2017-12-19 NOTE — Progress Notes (Signed)
   12/19/17 0900  Clinical Encounter Type  Visited With Patient  Visit Type Initial  Referral From Chaplain  Consult/Referral To Chaplain  Spiritual Encounters  Spiritual Needs Prayer  Stress Factors  Patient Stress Factors Exhausted     Patient was in his bed awake. No family present. Charge nurse  Came in  There after. Patient shared about his family dynamics which seemingly indicated little to no family support. I provided emotional support and encouragement.  Arthur [name] a Water quality scientistMusiko-Holley, E[employer] I. du PontChaplain

## 2017-12-20 LAB — BASIC METABOLIC PANEL
Anion gap: 6 (ref 5–15)
BUN: 15 mg/dL (ref 6–20)
CO2: 24 mmol/L (ref 22–32)
Calcium: 8.7 mg/dL — ABNORMAL LOW (ref 8.9–10.3)
Chloride: 107 mmol/L (ref 101–111)
Creatinine, Ser: 1.12 mg/dL (ref 0.61–1.24)
GFR calc Af Amer: >60 mL/min (ref >60)
GLUCOSE: 88 mg/dL (ref 65–99)
POTASSIUM: 4 mmol/L (ref 3.5–5.1)
Sodium: 137 mmol/L (ref 135–145)

## 2017-12-20 LAB — MAGNESIUM: Magnesium: 2 mg/dL (ref 1.7–2.4)

## 2017-12-20 MED ORDER — SOTALOL HCL 80 MG PO TABS
80.0000 mg | ORAL_TABLET | Freq: Two times a day (BID) | ORAL | Status: DC
  Administered 2017-12-20 – 2017-12-21 (×3): 80 mg via ORAL
  Filled 2017-12-20 (×3): qty 1

## 2017-12-20 NOTE — Progress Notes (Signed)
   12/20/17 1100  Clinical Encounter Type  Visited With Patient  Visit Type Follow-up  Referral From Chaplain  Consult/Referral To Chaplain  Spiritual Encounters  Spiritual Needs Emotional  Stress Factors  Patient Stress Factors Exhausted  Family Stress Factors None identified     Patient was on his bed awake. Charge nurse on-site helping patient. No family present as patient earlier shared about poor family dynamics and support. Chaplain provided emotional support through compassionate presence.  Lodie Waheed a Water quality scientistMusiko-Holley, [employer]. [employer]. [employer] PontChaplain

## 2017-12-20 NOTE — Plan of Care (Signed)
Patient uses call light appropriately and is able to use phone to call RN/NT using white board. Instructed patient to use the walker provided in room and to call me if he needs to get up. Reviewed some about his HR and medications that effect it, will need to continue to reinforce.

## 2017-12-20 NOTE — Progress Notes (Cosign Needed)
Progress Note  Patient Name: Arthur [name] Date of Encounter: 12/20/2017  Primary Cardiologist: Dr. Antoine Poche  Subjective   No complaints, no CP, palapitations, or SOB, tolertating medicine so far was observed to ambulate well and without difficulty.  He mentions yesterday some slight lightheadedness, not dizzy, no near syncope or syncope.  Inpatient Medications    Scheduled Meds: . ARIPiprazole  15 mg Oral Daily  . bictegravir-emtricitabine-tenofovir AF  1 tablet Oral Daily  . cholecalciferol  2,000 Units Oral Daily  . DULoxetine  120 mg Oral Daily  . ferrous sulfate  325 mg Oral Daily  . hydrOXYzine  50 mg Oral QHS  . mirtazapine  15 mg Oral QHS  . prazosin  2 mg Oral QHS  . rivaroxaban  20 mg Oral Q breakfast  . rosuvastatin  10 mg Oral QHS  . sotalol  80 mg Oral Q12H  . tamsulosin  0.4 mg Oral BID  . topiramate  50 mg Oral QHS  . vitamin B-12  1,000 mcg Oral QODAY   Continuous Infusions:  PRN Meds: acetaminophen, clonazePAM, nitroGLYCERIN   Vital Signs    Vitals:   12/19/17 1348 12/19/17 2000 12/19/17 2209 12/20/17 0520  BP: 109/74 121/85 121/85 117/75  Pulse:  (!) 46  (!) 49  Resp:      Temp: 98.7 F (37.1 C) (!) 97.4 F (36.3 C)  98.1 F (36.7 C)  TempSrc: Oral Oral  Oral  SpO2: 100% 97%  96%  Weight:      Height:        Intake/Output Summary (Last 24 hours) at 12/20/2017 0903 Last data filed at 12/20/2017 1610 Gross per 24 hour  Intake 1080 ml  Output -  Net 1080 ml   Filed Weights   12/18/17 1400  Weight: 195 lb 9.6 oz (88.7 kg)    Telemetry    SR/SB high 40's-60's - Personally Reviewed  ECG    SB, QTc stable - Personally Reviewed with Dr. Elberta Fortis  Physical Exam   Exam is unchanged today GEN: No acute distress.   Neck: No JVD Cardiac: RRR, no murmurs, rubs, or gallops.  Respiratory: CTA b/l. GI: Soft, nontender, non-distended  MS: No edema; No deformity. Neuro:  Nonfocal  Psych: Normal affect   Labs    Chemistry Recent Labs    Lab 12/18/17 1310 12/19/17 0558 12/20/17 0322  NA 137 136 137  K 4.2 4.0 4.0  CL 105 106 107  CO2 24 23 24   GLUCOSE 103* 87 88  BUN 12 13 15   CREATININE 1.35* 1.17 1.12  CALCIUM 8.8* 8.6* 8.7*  GFRNONAA 55* >60 >60  GFRAA >60 >60 >60  ANIONGAP 8 7 6      HematologyNo results for input(s): WBC, RBC, HGB, HCT, MCV, MCH, MCHC, RDW, PLT in the last 168 hours.  Cardiac EnzymesNo results for input(s): TROPONINI in the last 168 hours. No results for input(s): TROPIPOC in the last 168 hours.   BNPNo results for input(s): BNP, PROBNP in the last 168 hours.   DDimer No results for input(s): DDIMER in the last 168 hours.   Radiology    No results found.  Cardiac Studies   11/27/17: TTE Study Conclusions - Left ventricle: The cavity size was normal. Wall thickness was   normal. Systolic function was normal. The estimated ejection   fraction was in the range of 60% to 65%. Wall motion was normal;   there were no regional wall motion abnormalities. - Mitral valve: There was mild regurgitation. -  Left atrium: The atrium was mildly dilated. - Right atrium: The atrium was mildly dilated.   Patient Profile     62 y.o. 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 Paroxysmal AFib     CHA2DS2Vasc is 3, on Xarelto     K+ 4.0     Mag 2.0     Creat 1.12     QTc stable, by telemetry     Given bradycardia will reduce dose back to 80mg  today  Bradycardia, no BB yesterday outside of his sotalol,  he does have HR increase with ambulation to 60's-70's, though resting awake rates are 47-low 50's.  ADD options are very limited given current medicines  2. HTN     No changes today         For questions or updates, please contact [facility] Please consult [email or web address] for contact  info under Cardiology/STEMI.      Signed, Sheilah Pigeonenee Lynn Zoelle Markus, PA-C  12/20/2017, 9:03 AM

## 2017-12-21 LAB — BASIC METABOLIC PANEL
Anion gap: 8 (ref 5–15)
BUN: 13 mg/dL (ref 6–20)
CALCIUM: 8.9 mg/dL (ref 8.9–10.3)
CHLORIDE: 106 mmol/L (ref 101–111)
CO2: 23 mmol/L (ref 22–32)
CREATININE: 1.17 mg/dL (ref 0.61–1.24)
GFR calc Af Amer: >60 mL/min (ref >60)
GFR calc non Af Amer: >60 mL/min (ref >60)
GLUCOSE: 169 mg/dL — AB (ref 65–99)
Potassium: 4.1 mmol/L (ref 3.5–5.1)
Sodium: 137 mmol/L (ref 135–145)

## 2017-12-21 LAB — MAGNESIUM: Magnesium: 2.1 mg/dL (ref 1.7–2.4)

## 2017-12-21 MED ORDER — RIVAROXABAN 20 MG PO TABS: 20.0000 mg | ORAL_TABLET | Freq: Every day | ORAL | 11 refills | Status: DC

## 2017-12-21 MED ORDER — SOTALOL HCL 80 MG PO TABS: 80.0000 mg | ORAL_TABLET | Freq: Two times a day (BID) | ORAL | 6 refills | Status: DC

## 2017-12-21 MED ORDER — SOTALOL HCL 80 MG PO TABS: 80.0000 mg | ORAL_TABLET | Freq: Two times a day (BID) | ORAL | 5 refills | Status: AC

## 2017-12-21 NOTE — Discharge Summary (Signed)
ELECTROPHYSIOLOGY PROCEDURE DISCHARGE SUMMARY    Patient ID: Arthur [name],  MRN: [id number], DOB/AGE: Apr 14, 1956 62 y.o.  Admit date: 12/18/2017 Discharge date: 12/21/2017  Primary Care Physician: Deneise LeverSaraiya, Parth, MD  Primary Cardiologist: Dr. Antoine PocheHochrein Electrophysiologist: new to Dr. Elberta Fortisamnitz this admission  Primary Discharge Diagnosis:  1.  Persistent  atrial fibrillation status post Sotalol loading this admission  Secondary Discharge Diagnosis:  1. HIV + 2. HTN 3. Anxiety  No Known Allergies   Procedures This Admission:  1.  Sotalol loading    Brief HPI: Arthur [name] is a 62 y.o. male with a past medical history as noted above.  He was referred to the AFib clinic in the outpatient setting for treatment options of atrial fibrillation.  Risks, benefits, and alternatives to Sotalol were reviewed with the patient who wished to proceed.    Hospital Course:  The patient was admitted andSotalol was initiated.  Renal function and electrolytes were followed during the hospitalization.  Their QTc remained stable.  He did develop bradycardia, his metoprolol held, though remained bradycardic, his sotalol down-titrated, and reverted to AFib w/CVR, generally 60's.  He was monitored until discharge on telemetry which demonstrated SR/SB >> AFib 60's at time of discharge.  On the day of discharge, they were examined by Dr Elberta Fortisamnitz who considered the patient stable for discharge to home.  Follow-up has been arranged with the AFib clinic in 1 week for labs/EKG, visit, if still in AFib woulld plan for DCCV.  Dr. Elberta Fortisamnitz discussed this with the patient, and he is comfortable and agreeable with this plan, and f/u with Dr. Elberta Fortisamnitz in 4 weeks.   Physical Exam: Vitals:   12/20/17 1023 12/20/17 1607 12/20/17 2033 12/21/17 0447  BP: 106/71 100/81 111/83 103/73  Pulse: (!) 134 60 (!) 43 74  Resp:      Temp:  98.4 F (36.9 C) 98.3 F (36.8 C) 97.7 F (36.5 C)  TempSrc:  Oral Oral Oral  SpO2: 94%  99% 96% 95%  Weight:      Height:        GEN- The patient is well appearing, alert and oriented x 3 today.   HEENT: normocephalic, atraumatic; sclera clear, conjunctiva pink; hearing intact; oropharynx clear; neck supple, no JVP Lymph- no cervical lymphadenopathy Lungs- CTA b/l, normal work of breathing.  No wheezes, rales, rhonchi Heart- iRRR,, no murmurs, rubs or gallops, PMI not laterally displaced GI- soft, non-tender, non-distended Extremities- no clubbing, cyanosis, or edema MS- no significant deformity or atrophy Skin- warm and dry, no rash or lesion Psych- euthymic mood, full affect Neuro- strength and sensation are intact   Labs:   Lab Results  Component Value Date   WBC 5.2 12/07/2017   HGB 13.6 12/07/2017   HCT 40.6 12/07/2017   MCV 97.1 12/07/2017   PLT 239 12/07/2017    Recent Labs  Lab 12/20/17 0322  NA 137  K 4.0  CL 107  CO2 24  BUN 15  CREATININE 1.12  CALCIUM 8.7*  GLUCOSE 88     Discharge Medications:  Allergies as of 12/21/2017   No Known Allergies     Medication List    STOP taking these medications   metoprolol tartrate 50 MG tablet Commonly known as:  LOPRESSOR     TAKE these medications   ARIPiprazole 15 MG tablet Commonly known as:  ABILIFY Take 15 mg by mouth daily.   bictegravir-emtricitabine-tenofovir AF 50-200-25 MG Tabs tablet Commonly known as:  BIKTARVY Take 1 tablet by  mouth daily.   docusate sodium 100 MG capsule Commonly known as:  COLACE Take 1 capsule (100 mg total) by mouth daily. (May purchase from over the counter at the pharmacy): For constipation What changed:    when to take this  reasons to take this  additional instructions   DULoxetine 60 MG capsule Commonly known as:  CYMBALTA TAKE 2 CAPSULES(120 MG) BY MOUTH DAILY What changed:  See the new instructions.   ferrous sulfate 325 (65 FE) MG EC tablet Take 325 mg by mouth daily.   hydrocortisone cream 1 % Apply topically 2 (two) times daily as  needed for itching.   hydrOXYzine 50 MG tablet Commonly known as:  ATARAX/VISTARIL Take 50 mg by mouth at bedtime.   KLONOPIN 0.5 MG tablet Generic drug:  clonazePAM Take 0.5 mg by mouth 3 times/day as needed-between meals & bedtime.   mirtazapine 15 MG tablet Commonly known as:  REMERON Take 1 tablet (15 mg total) by mouth at bedtime. For depression/sleep   prazosin 2 MG capsule Commonly known as:  MINIPRESS Take 2 mg by mouth at bedtime.   rivaroxaban 20 MG Tabs tablet Commonly known as:  XARELTO Take 1 tablet (20 mg total) by mouth daily with supper. What changed:  when to take this   rosuvastatin 10 MG tablet Commonly known as:  CRESTOR Take 1 tablet (10 mg total) by mouth at bedtime. For high cholesterol   sildenafil 100 MG tablet Commonly known as:  VIAGRA Take 1 tablet (100 mg total) by mouth daily as needed for erectile dysfunction.   sotalol 80 MG tablet Commonly known as:  BETAPACE Take 1 tablet (80 mg total) by mouth every 12 (twelve) hours.   tamsulosin 0.4 MG Caps capsule Commonly known as:  FLOMAX Take 1 capsule (0.4 mg total) by mouth 2 (two) times daily. For prostate health   topiramate 50 MG tablet Commonly known as:  TOPAMAX Take 50 mg by mouth at bedtime.   VITAMIN B 12 PO Take 1,000 mg by mouth every other day.   Vitamin D 2000 units Caps Take 2,000 Units by mouth daily.       Disposition:  Home Discharge Instructions    Diet - low sodium heart healthy   Complete by:  As directed    Increase activity slowly   Complete by:  As directed      Follow-up Information    [facility] Follow up on 12/28/2017.   Specialty:  Cardiology Why:  11:00AM Contact information: [address] [id number] mc [place] [address] [phone]          Duration of Discharge Encounter: Greater than 30 minutes including physician time.  SignedFrancis Dowse, PA-C 12/21/2017 9:06 AM  I have seen and  examined this patient with Francis Dowse.  Agree with above, note added to reflect my findings.  On exam, RRR, no murmurs, lungs clear.  Admitted for sotalol loading.  Did have significant bradycardia and thus missed a few doses of his sotalol.  Did go in atrial fibrillation at that time.  Has been restarted on 80 mg twice a day and QTC has been stable.  We Hazelee Harbold plan for discharge today with follow-up in AF clinic in 1 week.  He may require cardioversion at that time.  Zuzu Befort M. Marlow Berenguer MD 12/21/2017 9:45 AM

## 2017-12-21 NOTE — Discharge Instructions (Signed)
You have an appointment set up with the [facility].  Multiple studies have shown that being followed by a dedicated atrial fibrillation clinic in addition to the standard care you receive from your other physicians improves health. We believe that enrollment in the atrial fibrillation clinic will allow [place] to better care for you.   The phone number to the Atrial Fibrillation Clinic is [phone]. The clinic is staffed Monday through Friday from 8:30am to 5pm.  Parking Directions: The clinic is located in the Heart and Vascular Building connected to [facility]. 1)From [address] turn on to [address] and go to the 3rd entrance  (Heart and Vascular entrance) on the right. 2)Look to the right for Heart &Vascular Parking Garage. 3)A code for the entrance is required please call the clinic to receive this.   4)Take the elevators to the 1st floor. Registration is in the room with the glass walls at the end of the hallway.  If you have any trouble parking or locating the clinic, please dont hesitate to call [phone].   Heart-Healthy Eating Plan Heart-healthy meal planning includes:  Limiting unhealthy fats.  Increasing healthy fats.  Making other small dietary changes.  You may need to talk with your doctor or a diet specialist (dietitian) to create an eating plan that is right for you. What types of fat should I choose?  Choose healthy fats. These include olive oil and canola oil, flaxseeds, walnuts, almonds, and seeds.  Eat more omega-3 fats. These include salmon, mackerel, sardines, tuna, flaxseed oil, and ground flaxseeds. Try to eat fish at least twice each week.  Limit saturated fats. ? Saturated fats are often found in animal products, such as meats, butter, and cream. ? Plant sources of saturated fats include palm oil, palm kernel oil, and coconut oil.  Avoid foods with partially hydrogenated oils in them. These include stick margarine, some  tub margarines, cookies, crackers, and other baked goods. These contain trans fats. What general guidelines do I need to follow?  Check food labels carefully. Identify foods with trans fats or high amounts of saturated fat.  Fill one half of your plate with vegetables and green salads. Eat 4-5 servings of vegetables per day. A serving of vegetables is: ? 1 cup of raw leafy vegetables. ?  cup of raw or cooked cut-up vegetables. ?  cup of vegetable juice.  Fill one fourth of your plate with whole grains. Look for the word "whole" as the first word in the ingredient list.  Fill one fourth of your plate with lean protein foods.  Eat 4-5 servings of fruit per day. A serving of fruit is: ? One medium whole fruit. ?  cup of dried fruit. ?  cup of fresh, frozen, or canned fruit. ?  cup of 100% fruit juice.  Eat more foods that contain soluble fiber. These include apples, broccoli, carrots, beans, peas, and barley. Try to get 20-30 g of fiber per day.  Eat more home-cooked food. Eat less restaurant, buffet, and fast food.  Limit or avoid alcohol.  Limit foods high in starch and sugar.  Avoid fried foods.  Avoid frying your food. Try baking, boiling, grilling, or broiling it instead. You can also reduce fat by: ? Removing the skin from poultry. ? Removing all visible fats from meats. ? Skimming the fat off of stews, soups, and gravies before serving them. ? Steaming vegetables in water or broth.  Lose weight if you are overweight.  Eat 4-5  servings of nuts, legumes, and seeds per week: ? One serving of dried beans or legumes equals  cup after being cooked. ? One serving of nuts equals 1 ounces. ? One serving of seeds equals  ounce or one tablespoon.  You may need to keep track of how much salt or sodium you eat. This is especially true if you have high blood pressure. Talk with your doctor or dietitian to get more information. What foods can I eat? Grains Breads, including  [place], white, pita, wheat, raisin, rye, oatmeal, and [place]. Tortillas that are neither fried nor made with lard or trans fat. Low-fat rolls, including hotdog and hamburger buns and English muffins. Biscuits. Muffins. Waffles. Pancakes. Light popcorn. Whole-grain cereals. Flatbread. Melba toast. Pretzels. Breadsticks. Rusks. Low-fat snacks. Low-fat crackers, including oyster, saltine, matzo, graham, animal, and rye. Rice and pasta, including brown rice and pastas that are made with whole wheat. Vegetables All vegetables. Fruits All fruits, but limit coconut. Meats and Other Protein Sources Lean, well-trimmed beef, veal, pork, and lamb. Chicken and [place] without skin. All fish and shellfish. Wild duck, rabbit, pheasant, and venison. Egg whites or low-cholesterol egg substitutes. Dried beans, peas, lentils, and tofu. Seeds and most nuts. Dairy Low-fat or nonfat cheeses, including ricotta, string, and mozzarella. Skim or 1% milk that is liquid, powdered, or evaporated. Buttermilk that is made with low-fat milk. Nonfat or low-fat yogurt. Beverages Mineral water. Diet carbonated beverages. Sweets and Desserts Sherbets and fruit ices. Honey, jam, marmalade, jelly, and syrups. Meringues and gelatins. Pure sugar candy, such as hard candy, jelly beans, gumdrops, mints, marshmallows, and small amounts of dark chocolate. [employer]. Eat all sweets and desserts in moderation. Fats and Oils Nonhydrogenated (trans-free) margarines. Vegetable oils, including soybean, sesame, sunflower, olive, peanut, safflower, corn, canola, and cottonseed. Salad dressings or mayonnaise made with a vegetable oil. Limit added fats and oils that you use for cooking, baking, salads, and as spreads. Other Cocoa powder. Coffee and tea. All seasonings and condiments. The items listed above may not be a complete list of recommended foods or beverages. Contact your dietitian for more options. What foods are not  recommended? Grains Breads that are made with saturated or trans fats, oils, or whole milk. Croissants. Butter rolls. Cheese breads. Sweet rolls. Donuts. Buttered popcorn. Chow mein noodles. High-fat crackers, such as cheese or butter crackers. Meats and Other Protein Sources Fatty meats, such as hotdogs, short ribs, sausage, spareribs, bacon, rib eye roast or steak, and mutton. High-fat deli meats, such as salami and bologna. Caviar. Domestic duck and goose. Organ meats, such as kidney, liver, sweetbreads, and heart. Dairy Cream, sour cream, cream cheese, and creamed cottage cheese. Whole-milk cheeses, including blue (bleu), [address], [place], [place], [address], [place], [address], cheddar, [place], and [place]. Whole or 2% milk that is liquid, evaporated, or condensed. Whole buttermilk. Cream sauce or high-fat cheese sauce. Yogurt that is made from whole milk. Beverages Regular sodas and juice drinks with added sugar. Sweets and Desserts Frosting. Pudding. Cookies. Cakes other than angel food cake. Candy that has milk chocolate or white chocolate, hydrogenated fat, butter, coconut, or unknown ingredients. Buttered syrups. Full-fat ice cream or ice cream drinks. Fats and Oils Gravy that has suet, meat fat, or shortening. Cocoa butter, hydrogenated oils, palm oil, coconut oil, palm kernel oil. These can often be found in baked products, candy, fried foods, nondairy creamers, and whipped toppings. Solid fats and shortenings, including bacon fat, salt pork, lard, and butter. Nondairy cream substitutes, such as coffee  creamers and sour cream substitutes. Salad dressings that are made of unknown oils, cheese, or sour cream. The items listed above may not be a complete list of foods and beverages to avoid. Contact your dietitian for more information. This information is not intended to replace advice given to you by your health care provider. Make sure you discuss any questions you have with your health care  provider. Document Released: 05/29/2012 Document Revised: 05/05/2016 Document Reviewed: 05/22/2014 Elsevier Interactive Patient Education  2018 [employer].   Sotalol tablets (Betapace) What is this medicine? SOTALOL (SOE ta lole) is a beta-blocker. Beta-blockers reduce the workload on the heart and help it to beat more regularly. This medicine is used to treat heart rhythm problems and to slow rapid heartbeats. This medicine can help your heart to return to and maintain a normal rhythm. This medicine may be used for other purposes; ask your health care provider or pharmacist if you have questions. COMMON BRAND NAME(S): BETAPACE, Sorine What should I tell my health care provider before I take this medicine? They need to know if you have any of these conditions: -diabetes -heart or vessel disease like slow heart rate, worsening heart failure, heart block, sick sinus syndrome or Raynaud's disease -kidney disease -liver disease -history of low levels of potassium or magnesium -lung or breathing disease, like asthma or emphysema -pheochromocytoma -recent heart attack -thyroid disease -an unusual or allergic reaction to sotalol, other beta-blockers, medicines, foods, dyes, or preservatives -pregnant or trying to get pregnant -breast-feeding How should I use this medicine? Take this medicine by mouth with a glass of water. Follow the directions on the prescription label. Take your doses at regular intervals. Do not take your medicine more often than directed. Do not stop taking this medicine suddenly. This could lead to serious heart-related effects. Talk to your pediatrician regarding the use of this medicine in children. Special care may be needed. While this medicine may be used in children for selected conditions precautions do apply. Overdosage: If you think you have taken too much of this medicine contact a poison control center or emergency room at once. NOTE: This medicine is only for  you. Do not share this medicine with others. What if I miss a dose? If you miss a dose, take it as soon as you can. If it is almost time for your next dose, take only that dose. Do not take double or extra doses. What may interact with this medicine? Do not take this medicine with any of the following medications: -amoxapine -arsenic trioxide -certain antibiotics like gatifloxacin, grepafloxacin, levofloxacin, moxifloxacin, sparfloxacin, telithromycin -cisapride -droperidol -haloperidol -hawthorn -maprotiline -medicines for malaria like chloroquine and halofantrine -medicines to control heart rhythm -methadone -pentamidine -phenothiazines like prochlorperazine, perphenazine, thioridazine, and others -pimozide -ranolazine -tricyclic antidepressants like amitriptyline, imipramine, nortriptyline, and others -vardenafil -ziprasidone This medicine may also interact with the following medications: -antacids -certain antibiotics such as clarithromycin and erythromycin -clonidine -digoxin -medicines for angina or high blood pressure -medicines for colds and breathing difficulties -medicines for diabetes -other beta-blockers like atenolol, metoprolol, propranolol and others This list may not describe all possible interactions. Give your health care provider a list of all the medicines, herbs, non-prescription drugs, or dietary supplements you use. Also tell them if you smoke, drink alcohol, or use illegal drugs. Some items may interact with your medicine. What should I watch for while using this medicine? Visit your doctor or health care professional for regular checks on your progress. Check your heart rate and  blood pressure regularly while you are taking this medicine. Ask your doctor or health care professional what your heart rate and blood pressure should be, and when you should contact him or her. Your doctor or health care professional also may schedule regular blood tests and  electrocardiograms to check your progress. Because your condition and the use of this medicine carry some risk, it is a good idea to carry an identification card, necklace or bracelet with details of your condition, medications, and doctor or health care professional. Bonita QuinYou may get drowsy or dizzy. Do not drive, use machinery, or do anything that needs mental alertness until you know how this drug affects you. Do not stand or sit up quickly, especially if you are an older patient. This reduces the risk of dizzy or fainting spells. Alcohol can make you more drowsy and dizzy. Avoid alcoholic drinks. Do not treat yourself for coughs, colds, or pain while you are taking this medicine without asking your doctor or health care professional for advice. Some ingredients may increase your blood pressure. If you are going to have surgery, tell your doctor or health care professional that you are taking this medicine. What side effects may I notice from receiving this medicine? Side effects that you should report to your doctor or health care professional as soon as possible: -chest pain -cold, tingling, or numb hands or feet -confusion -diarrhea -difficulty breathing, wheezing -irregular heartbeat -muscle aches and pains -skin rash -slow heart rate -sweating -swollen legs or ankles -tremor, shakes -vomiting Side effects that usually do not require medical attention (report to your doctor or health care professional if they continue or are bothersome): -change in sex drive or performance -mental depression -nausea -weakness or tiredness This list may not describe all possible side effects. Call your doctor for medical advice about side effects. You may report side effects to FDA at 1-800-FDA-1088. Where should I keep my medicine? Keep out of the reach of children. Store at room temperature between 15 and 30 degrees C (59 and 86 degrees F). Throw away any unused medicine after the expiration  date. NOTE: This sheet is a summary. It may not cover all possible information. If you have questions about this medicine, talk to your doctor, pharmacist, or health care provider.  2018 Elsevier/Gold Standard (2013-07-30 15:18:52)

## 2017-12-21 NOTE — Care Management Note (Signed)
Case Management Note  Patient Details  Name: Arthur [name] MRN: [id number] Date of Birth: [birth year]  Subjective/Objective:  Pt presented for [name]. PTA pt is without insurance. Pt [facility]. CM did call the HD and Betapace is not a drug they can order. CM did call [facility] and cost is 68.37. Pt is unable to afford the Medication.                   Action/Plan: [facility] and [facility] will be $[facility].00. [name] CM did suggest he look at local Independent Pharmacies to see if the cost is cheaper. 30 day free card for Xarelto provided to patient along with patient Assistance Form for MD to fill out and fax to company. Pt needs to be Enrolled in the Patient Assistance Program. No further needs from CM at this time.   Expected Discharge Date:  12/21/17               Expected Discharge Plan:  Home/Self Care  In-House Referral:  NA  Discharge planning Services  CM Consult, MATCH Program, Medication Assistance  Post Acute Care Choice:  NA Choice offered to:  NA  DME Arranged:  N/A DME Agency:  NA  HH Arranged:  NA HH Agency:  NA  Status of Service:  Completed, signed off  If discussed at Long Length of Stay Meetings, dates discussed:    Additional Comments:  Arthur [name], Arthur Cantero Kaye, RN 12/21/2017, 2:27 PM

## 2017-12-22 ENCOUNTER — Ambulatory Visit: Payer: No Typology Code available for payment source | Admitting: Physical Therapy

## 2017-12-25 ENCOUNTER — Ambulatory Visit: Payer: No Typology Code available for payment source | Admitting: Physical Therapy

## 2017-12-27 ENCOUNTER — Ambulatory Visit: Payer: No Typology Code available for payment source | Admitting: Physical Therapy

## 2017-12-27 ENCOUNTER — Other Ambulatory Visit: Payer: Self-pay | Admitting: Pharmacist

## 2017-12-27 ENCOUNTER — Telehealth: Payer: Self-pay | Admitting: Physical Therapy

## 2017-12-27 DIAGNOSIS — I480 Paroxysmal atrial fibrillation: Secondary | ICD-10-CM

## 2017-12-27 MED ORDER — RIVAROXABAN 20 MG PO TABS: 20.0000 mg | ORAL_TABLET | Freq: Every day | ORAL | 11 refills | Status: AC

## 2017-12-27 NOTE — Telephone Encounter (Signed)
PT called patient as he no-showed his 2 PT appointments this week. Patient reports he was too sick & weak for PT this week and just got of hospital on 1/10. He has a cardiology appointment tomorrow morning. Patient to speak with Rudi Cocoonna Carroll, NP and get note / orders (can place in Epic) if he is okay to resume PT 2x/wk for last 3 weeks.  Vladimir Fasterobin Amire Gossen, PT, DPT PT Specializing in Prosthetics & Orthotics @1 /16/2018@ 3:14 PM Phone:  [phone]  Fax:  [phone] [facility] [address] [place], [place] [address]

## 2017-12-27 NOTE — Progress Notes (Signed)
xarelto transferred to health department due to cost. Patient notified

## 2017-12-28 ENCOUNTER — Ambulatory Visit (INDEPENDENT_AMBULATORY_CARE_PROVIDER_SITE_OTHER): Payer: No Typology Code available for payment source | Admitting: Infectious Disease

## 2017-12-28 ENCOUNTER — Encounter: Payer: Self-pay | Admitting: Infectious Disease

## 2017-12-28 ENCOUNTER — Ambulatory Visit (HOSPITAL_COMMUNITY)
Admission: RE | Admit: 2017-12-28 | Discharge: 2017-12-28 | Disposition: A | Discharge status: Home / Self Care | Payer: No Typology Code available for payment source | Source: Ambulatory Visit | Attending: Nurse Practitioner | Admitting: Nurse Practitioner

## 2017-12-28 ENCOUNTER — Encounter (HOSPITAL_COMMUNITY): Payer: Self-pay | Admitting: Nurse Practitioner

## 2017-12-28 VITALS — BP 142/94 | HR 51 | Ht 70.0 in | Wt 200.8 lb

## 2017-12-28 VITALS — BP 126/81 | HR 56 | Temp 97.7°F | Ht 70.0 in | Wt 198.0 lb

## 2017-12-28 DIAGNOSIS — Z823 Family history of stroke: Secondary | ICD-10-CM | POA: Insufficient documentation

## 2017-12-28 DIAGNOSIS — Z923 Personal history of irradiation: Secondary | ICD-10-CM | POA: Insufficient documentation

## 2017-12-28 DIAGNOSIS — Z7902 Long term (current) use of antithrombotics/antiplatelets: Secondary | ICD-10-CM | POA: Insufficient documentation

## 2017-12-28 DIAGNOSIS — Z8673 Personal history of transient ischemic attack (TIA), and cerebral infarction without residual deficits: Secondary | ICD-10-CM | POA: Insufficient documentation

## 2017-12-28 DIAGNOSIS — I480 Paroxysmal atrial fibrillation: Secondary | ICD-10-CM

## 2017-12-28 DIAGNOSIS — R55 Syncope and collapse: Secondary | ICD-10-CM | POA: Insufficient documentation

## 2017-12-28 DIAGNOSIS — F319 Bipolar disorder, unspecified: Secondary | ICD-10-CM | POA: Insufficient documentation

## 2017-12-28 DIAGNOSIS — B20 Human immunodeficiency virus [HIV] disease: Secondary | ICD-10-CM

## 2017-12-28 DIAGNOSIS — A529 Late syphilis, unspecified: Secondary | ICD-10-CM

## 2017-12-28 DIAGNOSIS — Z9889 Other specified postprocedural states: Secondary | ICD-10-CM | POA: Insufficient documentation

## 2017-12-28 DIAGNOSIS — I10 Essential (primary) hypertension: Secondary | ICD-10-CM | POA: Insufficient documentation

## 2017-12-28 DIAGNOSIS — Z79899 Other long term (current) drug therapy: Secondary | ICD-10-CM | POA: Insufficient documentation

## 2017-12-28 DIAGNOSIS — Z9884 Bariatric surgery status: Secondary | ICD-10-CM | POA: Insufficient documentation

## 2017-12-28 DIAGNOSIS — Z8249 Family history of ischemic heart disease and other diseases of the circulatory system: Secondary | ICD-10-CM | POA: Insufficient documentation

## 2017-12-28 DIAGNOSIS — F332 Major depressive disorder, recurrent severe without psychotic features: Secondary | ICD-10-CM | POA: Insufficient documentation

## 2017-12-28 DIAGNOSIS — F1011 Alcohol abuse, in remission: Secondary | ICD-10-CM | POA: Insufficient documentation

## 2017-12-28 LAB — MAGNESIUM: MAGNESIUM: 2.1 mg/dL (ref 1.7–2.4)

## 2017-12-28 LAB — BASIC METABOLIC PANEL
ANION GAP: 9 (ref 5–15)
BUN: 7 mg/dL (ref 6–20)
CHLORIDE: 107 mmol/L (ref 101–111)
CO2: 21 mmol/L — AB (ref 22–32)
Calcium: 8.6 mg/dL — ABNORMAL LOW (ref 8.9–10.3)
Creatinine, Ser: 1.04 mg/dL (ref 0.61–1.24)
GFR calc non Af Amer: >60 mL/min (ref >60)
Glucose, Bld: 94 mg/dL (ref 65–99)
Potassium: 4.1 mmol/L (ref 3.5–5.1)
SODIUM: 137 mmol/L (ref 135–145)

## 2017-12-28 NOTE — Progress Notes (Signed)
Chief complaints: depressive symptoms are worse  Subjective:    Patient ID: Arthur [name], male    DOB: 08-26-1956, 62 y.o.   MRN: [id number]  HPI  62 year old with HIV.  He has history of bipolar disorder and alcohol abuse that had been in  remission.   He had been enrolled into  ACTG study and had been on  Dolutegravir and Lamivudine (dual therapy) and now changed over to  Tivicay and Descovy and now BIKTARVY.  He prefers his new BIKTARVY t o prior  Tivicay and Descovy.   Lab Results  Component Value Date   HIV1RNAQUANT <20 DETECTED (A) 12/07/2017   HIV1RNAQUANT <20 NOT DETECTED 06/08/2017   HIV1RNAQUANT 38 (H) 11/09/2016     Lab Results  Component Value Date   CD4TABS 870 12/07/2017   CD4TABS 1,090 11/09/2016   CD4TABS 960 05/25/2016   .  He was admitted by EP for sotalol infusion for his PAF and is being followed closely by Dr. Elberta Fortisamnitz  He states his is free of etoh but depression is not doing well. He is gong to [facility] where he is on mx meds for depression and bipolar.    Past Medical History:  Diagnosis Date  . Alcohol abuse 12/21/2016  . Bipolar disorder (HCC)   . Depression   . History of blood transfusion 12-12-1998  . History of radiation exposure 08/17/2017  . HTN (hypertension) 11/17/2016  . 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 Late syphilis   . Lower back pain 11/25/2015  . 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 TIA (transient ischemic attack) 11/17/2016    Past Surgical History:  Procedure Laterality Date  . broken right leg Right 2000  . GASTRIC BYPASS  2012   [facility]    Family History  Problem Relation Age of Onset  . Heart disease Mother [id number]       MI age 63.  Died age 63  . Stroke Mother   . Heart disease Father        Died age 1170s MI  . Heart disease Sister        No details      Social History   Socioeconomic History  . Marital status: Single    Spouse name: None  . Number of children: None  .  Years of education: None  . Highest education level: None  Social Needs  . Financial resource strain: None  . Food insecurity - worry: None  . Food insecurity - inability: None  . Transportation needs - medical: None  . Transportation needs - non-medical: None  Occupational History  . None  Tobacco Use  . Smoking status: Never Smoker  . Smokeless tobacco: Never Used  Substance and Sexual Activity  . Alcohol use: No    Comment: no EtOH in 20 days 09/26/17  . Drug use: No  . Sexual activity: Not Currently    Partners: Male  Other Topics Concern  . 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    No Known Allergies   Current Outpatient Medications:  .  ARIPiprazole (ABILIFY) 15 MG tablet, Take 15 mg by mouth daily., Disp: , Rfl:  .  bictegravir-emtricitabine-tenofovir AF (BIKTARVY) 50-200-25 MG TABS tablet, Take 1 tablet by mouth daily., Disp: 30 tablet, Rfl: 11 .  Cholecalciferol (VITAMIN D) 2000 units CAPS, Take 2,000 Units by mouth daily. , Disp: , Rfl:  .  clonazePAM (KLONOPIN) 0.5 MG tablet, Take 0.5 mg by mouth 3  times/day as needed-between meals & bedtime. , Disp: , Rfl:  .  Cyanocobalamin (VITAMIN B 12 PO), Take 1,000 mg by mouth every other day., Disp: , Rfl:  .  docusate sodium (COLACE) 100 MG capsule, Take 1 capsule (100 mg total) by mouth daily. (May purchase from over the counter at the pharmacy): For constipation (Patient taking differently: Take 100 mg by mouth daily as needed for mild constipation. (May purchase from over the counter at the pharmacy): For constipation), Disp: 1 capsule, Rfl: 0 .  DULoxetine (CYMBALTA) 60 MG capsule, TAKE 2 CAPSULES(120 MG) BY MOUTH DAILY (Patient taking differently: TAKE 2 CAPSULES (120 MG) BY MOUTH DAILY), Disp: 60 capsule, Rfl: 3 .  ferrous sulfate 325 (65 FE) MG EC tablet, Take 325 mg by mouth daily., Disp: , Rfl:  .  hydrocortisone cream 1 %, Apply topically 2 (two) times daily as needed for itching., Disp: 30 g, Rfl:  0 .  hydrOXYzine (ATARAX/VISTARIL) 50 MG tablet, Take 50 mg by mouth at bedtime., Disp: , Rfl:  .  mirtazapine (REMERON) 15 MG tablet, Take 1 tablet (15 mg total) by mouth at bedtime. For depression/sleep, Disp: 30 tablet, Rfl: 5 .  prazosin (MINIPRESS) 2 MG capsule, Take 2 mg by mouth at bedtime., Disp: , Rfl:  .  rivaroxaban (XARELTO) 20 MG TABS tablet, Take 1 tablet (20 mg total) by mouth daily with supper. [facility], Disp: 30 tablet, Rfl: 11 .  rosuvastatin (CRESTOR) 10 MG tablet, Take 1 tablet (10 mg total) by mouth at bedtime. For high cholesterol, Disp: 30 tablet, Rfl: 0 .  sildenafil (VIAGRA) 100 MG tablet, Take 1 tablet (100 mg total) by mouth daily as needed for erectile dysfunction., Disp: 30 tablet, Rfl: 5 .  sotalol (BETAPACE) 80 MG tablet, Take 1 tablet (80 mg total) by mouth every 12 (twelve) hours., Disp: 60 tablet, Rfl: 5 .  tamsulosin (FLOMAX) 0.4 MG CAPS capsule, Take 1 capsule (0.4 mg total) by mouth 2 (two) times daily. For prostate health, Disp: 30 capsule, Rfl: 0 .  topiramate (TOPAMAX) 50 MG tablet, Take 50 mg by mouth at bedtime. , Disp: , Rfl:    Review of Systems  Constitutional: Negative for activity change, appetite change, diaphoresis, fatigue and unexpected weight change.  HENT: Negative for congestion, rhinorrhea, sinus pressure, sneezing, sore throat and trouble swallowing.   Eyes: Negative for photophobia and visual disturbance.  Respiratory: Negative for chest tightness, shortness of breath, wheezing and stridor.   Cardiovascular: Positive for palpitations. Negative for chest pain and leg swelling.  Gastrointestinal: Negative for abdominal distention, anal bleeding, blood in stool, constipation and nausea.  Genitourinary: Negative for difficulty urinating, discharge, dysuria, flank pain and hematuria.  Musculoskeletal: Negative for back pain, gait problem and joint swelling.  Skin: Negative for color change, pallor, rash and wound.  Neurological: Negative  for dizziness, tremors, weakness and light-headedness.  Hematological: Negative for adenopathy. Does not bruise/bleed easily.  Psychiatric/Behavioral: Positive for dysphoric mood. Negative for agitation, behavioral problems, confusion, decreased concentration, self-injury and suicidal ideas. The patient is nervous/anxious.        Objective:   Physical Exam  Constitutional: He is oriented to person, place, and time. He appears well-developed and well-nourished. No distress.  HENT:  Head: Normocephalic and atraumatic.  Mouth/Throat: Oropharynx is clear and moist. No oropharyngeal exudate.  Eyes: Conjunctivae and EOM are normal. Pupils are equal, round, and reactive to light. No scleral icterus.  Neck: Normal range of motion. Neck supple. No JVD present.  Cardiovascular:  Normal rate and regular rhythm. Exam reveals friction rub.  No murmur heard. Pulmonary/Chest: Effort normal and breath sounds normal. No respiratory distress. He has no wheezes.  Abdominal: Soft. Bowel sounds are normal. He exhibits no distension. There is no tenderness.  Musculoskeletal: Normal range of motion. He exhibits no edema or tenderness.       Left hip: Normal.       Lumbar back: He exhibits normal range of motion, no tenderness and no bony tenderness.  Lymphadenopathy:    He has no cervical adenopathy.  Neurological: He is alert and oriented to person, place, and time. He has normal reflexes. He exhibits normal muscle tone. Coordination normal.  Skin: Skin is warm and dry. No rash noted. He is not diaphoretic. No erythema. No pallor.  Psychiatric: His behavior is normal. Judgment and thought content normal. He exhibits a depressed mood.          Assessment & Plan:    HIV disease: continue  BIKTARVY and rewew AD[facility] today. rtc in July  Depression with bipolar disorder and also hx of alcoholism. He is engaged with [facility], psychiatry > he did not want to meet wit Cordelia Pen. I double checked DDI with his psych meds  and his ARVs  Post gastric bypass surgery hypoglycemia: he needs to adhere to strict diet and take his acarbose  CAD: followed by  Dr. Antoine Poche

## 2017-12-28 NOTE — Progress Notes (Signed)
Primary Care Physician: Deneise Lever, MD Referring Physician: [facility] ER f/u Cardiologist: Dr. Anselm Pancoast Arthur [name] is a 62 y.o. 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 Stood up to go to kitchen and collapsed/passed out. He did not seek attention for it at the time. He presented for his stress test and was in afib. Dr. Mayford Knife was contacted and advised for him to go to ER. He was rate controlled in the  ER and it was decided w/u would be on outpatient  Basis. Syncopal spell  was thought 2/2 to be orthostatic in nature  from  afib with RVR and HCTZ. This was stopped and he was started on metoprolol for afib rate control.  He followed up with Dr. Antoine Poche after testing was done. A previous cardiac CTA, that was done at [facility] as part of a clinic trial, was found to have 50% LAD stenosis with "blooming artifact".   He had a [employer] and this was negative for ischemia . He had a normal echocardiogram. Pending sleep study.  When he last saw Dr. Antoine Poche, 12/20, he described  afib episodes every day which is felt  to be contributing to fatigue. Due to his current drug interactions, flecainide nor Tikosyn can be used and Dr. Antoine Poche sent him here to discuss  hospitalization for sotalol.   F/u in afib clinic, 1/7, for admit to sotalol. Per Margaretmary Dys, PharmD on review of meds prior to admit- "Medication list reviewed in anticipation of upcoming sotalol initiation. Patient is not taking any contraindicated medications, however he does take hydroxyzine and mirtazapine, both of which are QTc prolonging medications. Pt's QTc at baseline is < , will need to monitor closely. Sotalol can also increase the risk of hypotension when used with many of pt's current medications. He has been hypotensive recently and BP will also need to be  monitored closely.  Pt is in afib clinic 1/17, s/p sotalol load. Qt is stable. He is c/o fatigue since home from the hospital. EKG shows SR at 51 bpm. No other rate control drugs on board.Case manager was able to get pt a card to get sotalol for $3 a month.  Today, he denies symptoms of palpitations, chest pain, shortness of breath, orthopnea, PND, lower extremity edema, dizziness, presyncope, syncope, or neurologic sequela. + for fatigue. The patient is tolerating medications without difficulties and is otherwise without complaint today.   Past Medical History:  Diagnosis Date  . Alcohol abuse 12/21/2016  . Bipolar disorder (HCC)   . Depression   . History of blood transfusion 12-12-1998  . History of radiation exposure 08/17/2017  . HTN (hypertension) 11/17/2016  . 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 Late syphilis   . Lower back pain 11/25/2015  . 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 TIA (transient ischemic attack) 11/17/2016   Past Surgical History:  Procedure Laterality Date  . broken right leg Right 2000  . GASTRIC BYPASS  2012   [facility]    Current Outpatient Medications  Medication Sig Dispense Refill  . ARIPiprazole (ABILIFY) 15 MG tablet Take 15 mg by mouth daily.    . bictegravir-emtricitabine-tenofovir AF (BIKTARVY) 50-200-25 MG TABS tablet Take 1 tablet by mouth daily. 30 tablet 11  . Cholecalciferol (VITAMIN D) 2000 units CAPS Take 2,000 Units by mouth  daily.     . clonazePAM (KLONOPIN) 0.5 MG tablet Take 0.5 mg by mouth 3 times/day as needed-between meals & bedtime.     . Cyanocobalamin (VITAMIN B 12 PO) Take 1,000 mg by mouth every other day.    . docusate sodium (COLACE) 100 MG capsule Take 1 capsule (100 mg total) by mouth daily. (May purchase from over the counter at the pharmacy): For constipation (Patient taking differently: Take 100 mg by mouth daily as needed for mild constipation. (May purchase from over the counter at the  pharmacy): For constipation) 1 capsule 0  . DULoxetine (CYMBALTA) 60 MG capsule TAKE 2 CAPSULES(120 MG) BY MOUTH DAILY (Patient taking differently: TAKE 2 CAPSULES (120 MG) BY MOUTH DAILY) 60 capsule 3  . ferrous sulfate 325 (65 FE) MG EC tablet Take 325 mg by mouth daily.    . hydrocortisone cream 1 % Apply topically 2 (two) times daily as needed for itching. 30 g 0  . hydrOXYzine (ATARAX/VISTARIL) 50 MG tablet Take 50 mg by mouth at bedtime.    . mirtazapine (REMERON) 15 MG tablet Take 1 tablet (15 mg total) by mouth at bedtime. For depression/sleep 30 tablet 5  . prazosin (MINIPRESS) 2 MG capsule Take 2 mg by mouth at bedtime.    . rivaroxaban (XARELTO) 20 MG TABS tablet Take 1 tablet (20 mg total) by mouth daily with supper. [facility] pharmacy 30 tablet 11  . rosuvastatin (CRESTOR) 10 MG tablet Take 1 tablet (10 mg total) by mouth at bedtime. For high cholesterol 30 tablet 0  . sildenafil (VIAGRA) 100 MG tablet Take 1 tablet (100 mg total) by mouth daily as needed for erectile dysfunction. 30 tablet 5  . sotalol (BETAPACE) 80 MG tablet Take 1 tablet (80 mg total) by mouth every 12 (twelve) hours. 60 tablet 5  . tamsulosin (FLOMAX) 0.4 MG CAPS capsule Take 1 capsule (0.4 mg total) by mouth 2 (two) times daily. For prostate health 30 capsule 0  . topiramate (TOPAMAX) 50 MG tablet Take 50 mg by mouth at bedtime.      No current facility-administered medications for this encounter.     No Known Allergies  Social History   Socioeconomic History  . Marital status: Single    Spouse name: Not on file  . Number of children: Not on file  . Years of education: Not on file  . Highest education level: Not on file  Social Needs  . Financial resource strain: Not on file  . Food insecurity - worry: Not on file  . Food insecurity - inability: Not on file  . Transportation needs - medical: Not on file  . Transportation needs - non-medical: Not on file  Occupational History  . Not on file  Tobacco Use    . Smoking status: Never Smoker  . Smokeless tobacco: Never Used  Substance and Sexual Activity  . Alcohol use: No    Comment: no EtOH in 20 days 09/26/17  . Drug use: No  . Sexual activity: Not Currently    Partners: Male  Other Topics Concern  . 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    Family History  Problem Relation Age of Onset  . Heart disease Mother 22       MI age 28.  Died age 51  . Stroke Mother   . Heart disease Father        Died age 34s MI  . Heart disease Sister  No details    ROS- All systems are reviewed and negative except as per the HPI above  Physical Exam: Vitals:   12/28/17 1108  BP: (!) 142/94  Pulse: (!) 51  Weight: 200 lb 12.8 oz (91.1 kg)  Height: 5\' 10"  (1.778 m)   Wt Readings from Last 3 Encounters:  12/28/17 200 lb 12.8 oz (91.1 kg)  12/18/17 195 lb 9.6 oz (88.7 kg)  12/18/17 195 lb 9.6 oz (88.7 kg)    Labs: Lab Results  Component Value Date   NA 137 12/21/2017   K 4.1 12/21/2017   CL 106 12/21/2017   CO2 23 12/21/2017   GLUCOSE 169 (H) 12/21/2017   BUN 13 12/21/2017   CREATININE 1.17 12/21/2017   CALCIUM 8.9 12/21/2017   PHOS 3.1 11/16/2015   MG 2.1 12/21/2017   Lab Results  Component Value Date   INR 1.03 05/31/2016   Lab Results  Component Value Date   CHOL 154 12/07/2017   HDL 61 12/07/2017   LDLCALC 47 05/25/2016   TRIG 111 12/07/2017     GEN- The patient is well appearing, alert and oriented x 3 today.   Head- normocephalic, atraumatic Eyes-  Sclera clear, conjunctiva pink Ears- hearing intact Oropharynx- clear Neck- supple, no JVP Lymph- no cervical lymphadenopathy Lungs- Clear to ausculation bilaterally, normal work of breathing Heart- Regular rate and rhythm, no murmurs, rubs or gallops, PMI not laterally displaced GI- soft, NT, ND, + BS Extremities- no clubbing, cyanosis, or edema MS- no significant deformity or atrophy Skin- no rash or lesion Psych- euthymic mood,  full affect Neuro- strength and sensation are intact  EKG- NSR at 51 bpm, pr int 176 ms, qrs int 78  ms, qtc 446 ms    Assessment and Plan: 1. Symptomatic paroxysmal afib Recent admission for sotalol, interaction with current drugs and Tikosyn One short episode of afib yesterday less than one hour Qtc stable C/o fatigue since hospitalization, possibly 2/2 sotalol /brady Continue for now and see if tolerance/energy will improve He wants to return to Physical therapy sessions and he can return without restrictions  2. Syncope Has not had any reoccurrence  Thought secondary to orthostatic changes  on HCTZ and demands of afib Now off on hctz Reminded to stay hydrated  F/u with Dr. Elberta Fortisamnitz 2/12  Elvina Sidleonna C. Matthew Folksarroll, ANP-C [facility] [address] [place], [place] [address] [phone]

## 2017-12-29 ENCOUNTER — Ambulatory Visit: Payer: Self-pay | Admitting: Pharmacist

## 2018-01-01 ENCOUNTER — Ambulatory Visit: Payer: No Typology Code available for payment source | Admitting: Rehabilitation

## 2018-01-02 ENCOUNTER — Telehealth: Payer: Self-pay | Admitting: Neurology

## 2018-01-02 ENCOUNTER — Institutional Professional Consult (permissible substitution): Payer: Self-pay | Admitting: Neurology

## 2018-01-02 NOTE — Telephone Encounter (Signed)
Pt came for apt today but upon check in found out his patient assistance has expired and so he was unable to keep apt today. Apt was rescheduled per the front desk

## 2018-01-03 ENCOUNTER — Ambulatory Visit: Payer: Self-pay

## 2018-01-03 ENCOUNTER — Ambulatory Visit
Payer: No Typology Code available for payment source | Attending: Student in an Organized Health Care Education/Training Program | Admitting: Physical Therapy

## 2018-01-04 ENCOUNTER — Ambulatory Visit (HOSPITAL_COMMUNITY): Payer: Self-pay | Admitting: Psychiatry

## 2018-01-04 ENCOUNTER — Encounter (HOSPITAL_COMMUNITY): Payer: Self-pay

## 2018-01-10 NOTE — Addendum Note (Signed)
Addended by: Neomia DearPOWERS, Reilynn Lauro E on: 01/10/2018 08:36 PM   Modules accepted: Orders

## 2018-01-11 ENCOUNTER — Encounter: Payer: Self-pay | Admitting: *Deleted

## 2018-01-11 ENCOUNTER — Telehealth: Payer: Self-pay

## 2018-01-23 ENCOUNTER — Ambulatory Visit (INDEPENDENT_AMBULATORY_CARE_PROVIDER_SITE_OTHER): Payer: Self-pay | Admitting: Neurology

## 2018-01-23 ENCOUNTER — Ambulatory Visit (INDEPENDENT_AMBULATORY_CARE_PROVIDER_SITE_OTHER): Payer: No Typology Code available for payment source | Admitting: Cardiology

## 2018-01-23 ENCOUNTER — Encounter: Payer: Self-pay | Admitting: Cardiology

## 2018-01-23 ENCOUNTER — Encounter: Payer: Self-pay | Admitting: Neurology

## 2018-01-23 VITALS — BP 117/80 | HR 61 | Ht 70.0 in | Wt 195.0 lb

## 2018-01-23 VITALS — BP 110/70 | HR 52 | Ht 70.0 in | Wt 194.0 lb

## 2018-01-23 DIAGNOSIS — I481 Persistent atrial fibrillation: Secondary | ICD-10-CM

## 2018-01-23 DIAGNOSIS — I10 Essential (primary) hypertension: Secondary | ICD-10-CM

## 2018-01-23 DIAGNOSIS — E785 Hyperlipidemia, unspecified: Secondary | ICD-10-CM

## 2018-01-23 DIAGNOSIS — I4891 Unspecified atrial fibrillation: Secondary | ICD-10-CM

## 2018-01-23 DIAGNOSIS — I4819 Other persistent atrial fibrillation: Secondary | ICD-10-CM

## 2018-01-23 DIAGNOSIS — T7622XA Child sexual abuse, suspected, initial encounter: Secondary | ICD-10-CM | POA: Insufficient documentation

## 2018-01-23 DIAGNOSIS — F3341 Major depressive disorder, recurrent, in partial remission: Secondary | ICD-10-CM

## 2018-01-23 NOTE — Progress Notes (Signed)
Electrophysiology Office Note   Date:  01/23/2018   ID:  Arthur [name], DOB 24-May-1956, MRN [id number]  PCP:  Beola Cord, MD  Cardiologist:  Hochrein Primary Electrophysiologist:  Myeisha Kruser Jorja Loa, MD    Chief Complaint  Patient presents with  . Follow-up    PAF     History of Present Illness: Arthur [name] is a 62 y.o. male who is being seen today for the evaluation of atrial fibrillation at the request of Deneise Lever, MD. Presenting today for electrophysiology evaluation.  He has a history of HIV, hypertension, and persistent atrial fibrillation.  He was admitted to the hospital on 12/18/17 for sotalol loading.  He was bradycardic during hospitalization and the sotalol dose was decreased.  Unfortunately he went back into atrial fibrillation at discharge.  He presents today for follow-up.  He has been in and out of atrial fibrillation at times.  His most recent episode was last night.  This lasted up to 15 minutes.  He says that his symptoms have greatly improved since starting sotalol.    Today, he denies symptoms of palpitations, chest pain, shortness of breath, orthopnea, PND, lower extremity edema, claudication, dizziness, presyncope, syncope, bleeding, or neurologic sequela. The patient is tolerating medications without difficulties.    Past Medical History:  Diagnosis Date  . Alcohol abuse 12/21/2016  . Bipolar disorder (HCC)   . Depression   . History of blood transfusion 12-12-1998  . History of radiation exposure 08/17/2017  . HTN (hypertension) 11/17/2016  . 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 Late syphilis   . Lower back pain 11/25/2015  . 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 TIA (transient ischemic attack) 11/17/2016   Past Surgical History:  Procedure Laterality Date  . broken right leg Right 2000  . GASTRIC BYPASS  2012   [facility]     Current Outpatient Medications  Medication Sig Dispense Refill  .  ARIPiprazole (ABILIFY) 15 MG tablet Take 15 mg by mouth daily.    . bictegravir-emtricitabine-tenofovir AF (BIKTARVY) 50-200-25 MG TABS tablet Take 1 tablet by mouth daily. 30 tablet 11  . Cholecalciferol (VITAMIN D) 2000 units CAPS Take 2,000 Units by mouth daily.     . clonazePAM (KLONOPIN) 0.5 MG tablet Take 0.5 mg by mouth 3 times/day as needed-between meals & bedtime.     . Cyanocobalamin (VITAMIN B 12 PO) Take 1,000 mg by mouth every other day.    . docusate sodium (COLACE) 100 MG capsule Take 1 capsule (100 mg total) by mouth daily. (May purchase from over the counter at the pharmacy): For constipation (Patient taking differently: Take 100 mg by mouth daily as needed for mild constipation. (May purchase from over the counter at the pharmacy): For constipation) 1 capsule 0  . DULoxetine (CYMBALTA) 60 MG capsule TAKE 2 CAPSULES(120 MG) BY MOUTH DAILY (Patient taking differently: TAKE 2 CAPSULES (120 MG) BY MOUTH DAILY) 60 capsule 3  . ferrous sulfate 325 (65 FE) MG EC tablet Take 325 mg by mouth daily.    . hydrocortisone cream 1 % Apply topically 2 (two) times daily as needed for itching. 30 g 0  . hydrOXYzine (ATARAX/VISTARIL) 50 MG tablet Take 50 mg by mouth at bedtime.    . mirtazapine (REMERON) 15 MG tablet Take 1 tablet (15 mg total) by mouth at bedtime. For depression/sleep 30 tablet 5  . prazosin (MINIPRESS) 2 MG capsule Take 2 mg by mouth at bedtime.    . rivaroxaban (XARELTO) 20 MG TABS  tablet Take 1 tablet (20 mg total) by mouth daily with supper. [facility] pharmacy 30 tablet 11  . rosuvastatin (CRESTOR) 10 MG tablet Take 1 tablet (10 mg total) by mouth at bedtime. For high cholesterol 30 tablet 0  . sildenafil (VIAGRA) 100 MG tablet Take 1 tablet (100 mg total) by mouth daily as needed for erectile dysfunction. 30 tablet 5  . sotalol (BETAPACE) 80 MG tablet Take 1 tablet (80 mg total) by mouth every 12 (twelve) hours. 60 tablet 5  . tamsulosin (FLOMAX) 0.4 MG CAPS capsule Take 1 capsule  (0.4 mg total) by mouth 2 (two) times daily. For prostate health 30 capsule 0  . topiramate (TOPAMAX) 50 MG tablet Take 50 mg by mouth at bedtime.      No current facility-administered medications for this visit.     Allergies:   Patient has no known allergies.   Social History:  The patient  reports that  has never smoked. he has never used smokeless tobacco. He reports that he does not drink alcohol or use drugs.   Family History:  The patient's family history includes Heart disease in his father and sister; Heart disease (age of onset: 41) in his mother; Stroke in his mother.    ROS:  Please see the history of present illness.   Otherwise, review of systems is positive for depression.   All other systems are reviewed and negative.    PHYSICAL EXAM: VS:  BP 110/70   Pulse (!) 52   Ht 5\' 10"  (1.778 m)   Wt 194 lb (88 kg)   BMI 27.84 kg/m  , BMI Body mass index is 27.84 kg/m. GEN: Well nourished, well developed, in no acute distress  HEENT: normal  Neck: no JVD, carotid bruits, or masses Cardiac: RRR; no murmurs, rubs, or gallops,no edema  Respiratory:  clear to auscultation bilaterally, normal work of breathing GI: soft, nontender, nondistended, + BS MS: no deformity or atrophy  Skin: warm and dry Neuro:  Strength and sensation are intact Psych: euthymic mood, full affect  EKG:  EKG is ordered today. Personal review of the ekg ordered shows sinus rhythm, rate 52  Recent Labs: 10/04/2017: TSH 1.317 12/07/2017: ALT 18; Hemoglobin 13.6; Platelets 239 12/28/2017: BUN 7; Creatinine, Ser 1.04; Magnesium 2.1; Potassium 4.1; Sodium 137    Lipid Panel     Component Value Date/Time   CHOL 154 12/07/2017 1532   TRIG 111 12/07/2017 1532   HDL 61 12/07/2017 1532   CHOLHDL 2.5 12/07/2017 1532   VLDL 10 05/25/2016 0927   LDLCALC 47 05/25/2016 0927     Wt Readings from Last 3 Encounters:  01/23/18 194 lb (88 kg)  12/28/17 200 lb 12.8 oz (91.1 kg)  12/28/17 198 lb (89.8 kg)        Other studies Reviewed: Additional studies/ records that were reviewed today include: TTE 11/27/17  Review of the above records today demonstrates:  - Left ventricle: The cavity size was normal. Wall thickness was   normal. Systolic function was normal. The estimated ejection   fraction was in the range of 60% to 65%. Wall motion was normal;   there were no regional wall motion abnormalities. - Mitral valve: There was mild regurgitation. - Left atrium: The atrium was mildly dilated. - Right atrium: The atrium was mildly dilated.   ASSESSMENT AND PLAN:  1.  Persistent atrial fibrillation: Previously admitted to the hospital for sotalol loading.  Anticoagulated with Xarelto.  He has been in and out  of atrial fibrillation, though he has noted a big improvement since starting sotalol.  He does not feel further management is required.  We Merary Garguilo hold off on ablation discussion at this time.  Continue sotalol.  This patients CHA2DS2-VASc Score and unadjusted Ischemic Stroke Rate (% per year) is equal to 3.2 % stroke rate/year from a score of 3  Above score calculated as 1 point each if present [CHF, HTN, DM, Vascular=MI/PAD/Aortic Plaque, Age if 65-74, or Male] Above score calculated as 2 points each if present [Age > 75, or Stroke/TIA/TE]  2. Hypertension: Currently well controlled.  3.  Hyperlipidemia: Continue Crestor per primary physician.  Current medicines are reviewed at length with the patient today.   The patient does not have concerns regarding his medicines.  The following changes were made today:  none  Labs/ tests ordered today include:  Orders Placed This Encounter  Procedures  . EKG 12-Lead     Disposition:   FU with Tiffancy Moger 6 months  Signed, Ciarrah Rae Jorja LoaMartin Akshith Moncus, MD  01/23/2018 9:46 AM     [facility] [address] [place] [address] [phone] (office) [phone] (fax)

## 2018-01-23 NOTE — Patient Instructions (Signed)

## 2018-01-23 NOTE — Progress Notes (Signed)
SLEEP MEDICINE CLINIC   Provider:  Melvyn Novas, M D   Primary Care Physician:  Beola Cord, MD   Referring Provider: Dr. Rollene Rotunda, MD    Chief Complaint  Patient presents with  . New Patient (Initial Visit)    pt alone, rm 10 pt states that no matter how much he sleeps he is always tired. pt states that he gets on avg 2-3 hours at a time uninterupted. pt states very isolated and very depressed pt also menioned he had a sleep study completed 10years or so ago and that showed REM Behavior Dx. does not remember being treated for that    HPI:  Arthur [name] is a Caucasian 62 year old right-handed male , seen here  in a referral from Cardiologist Dr. Antoine Poche , who has treated the patient for atrial fibrillation. The patient has continued to report significant daytime fatigue, sleepiness but also is battling severe depression.  He has felt his atrial fibrillation, at night it has woken him.  He has had some lightheadedness which never progressed to a full syncope.  There has been no ankle edema, shortness of breath, And his past medical history is listed below- it is extensive including syphilis and TIAs, Bipolar disorder with mostly depressive symptoms, and alcohol abuse.   The patient has had a gastric bypass at [facility] 11/2011-his weight had been 280 pounds prior to the surgery and is now 195.  Mr. Widdowson was last seen by cardiology on 30 November 2017 with a primary diagnosis of paroxysmal atrial fibrillation.  He is also for 3 years involved in a clinical trial for treatment of HIV, that is estimated to continue for another 3 years.  He was apparently randomized to a statin versus a placebo drug and had a cardiac CT at [facility].  The CTA found a 50% left LAD stenosis and he was scheduled for a stress echo.  During the breath stress echo he was found to be in atrial fibrillation with rapid ventricular response. He had a Lexiscan Myoview that was negative for  ischemia of the cardiac muscle hydrochlorothiazide has been stopped and he was indeed started on a beta-blocker for rate control.  Normal echocardiogram followed according to Dr. Jenene Slicker notes. He was recently  hospitalized with acute atrial fib again and medication loaded 0n 12-18-2017 "History of Present Illness: [name] is a 62 y.o. male who is being seen today for the evaluation of atrial fibrillation at the request of Deneise Lever, MD. Presenting today for electrophysiology evaluation.  He has a history of HIV, hypertension, and persistent atrial fibrillation.  He was admitted to the hospital on 12/18/17 for sotalol loading.  He was bradycardic during hospitalization and the sotalol dose was decreased.  Unfortunately he went back into atrial fibrillation at discharge.  He presents today for follow-up.  He has been in and out of atrial fibrillation at times.  His most recent episode was last night.  This lasted up to 15 minutes.  He says that his symptoms have greatly improved since starting sotalol. " note form Dr Elberta Fortis.    Sleep habits are as follows: The patient does not have a regular bedtime, tries to be in bed by 10 PM.  He watches TV or reads in the living room before he retreats to the bedroom.  He lives alone.  The bedroom is cool, quiet and dark and he prefers to sleep on his sides, on 2 pillows. Once asleep he still has trouble staying  asleep.  Both nocturia and racing thoughts interrupt his sleep.  He is worried a lot.  He goes to urinate 4 times each night, sometimes more.  He still stays in bed a lot without sleeping.  He lacks any structures he admits and he rises whenever he wants to. He is not sure how many hours he really sleeps, but he spends a lot of time in bed or dozing off on the sofa He lives in a 1 bedroom apartment but states it is hard for him to move to muster the energy and strength to do anything.  He counts his steps to his bathroom within his 1 bedroom  apartment.  Sleep medical history and family sleep history: both parents had depression and sleep problems,  Sister has apnea and uses CPAP.  Mr. Boettner did suffer from night terrors as a child, he was not sleepwalking and he did not report enuresis.  Social history:  Lives alone in a one bedroom apartment. No pets.  The patient never smoked, he has been drinking alcohol 3 packs of light beer weekly ( 18 beers) .  He does not drink coffee, does not eat chocolate, from time to time he will drink diet sodas but these still contain caffeine. No energy drinks.  [phone]) Masters in Social works, @ [employer].   Mr. Najarian is on multiple medications including Abilify, Mctyre V, Klonopin 3 times a day and at bedtime vitamin B, Colace, Cymbalta, iron supplement, Vistaril 50 mg at bedtime, metoprolol 50 mg twice daily for half tablet, Remeron 15 mg 1 tablet at night, prazosin 2mg  tablet at bedtime, this has reduced his nightmares and bad dreams, Xarelto, Crestor, Viagra, Flomax, topiramate 50 mg by mouth daily.  Recent labs have shown normal sodium potassium TSH platelets and hemoglobin.  Cholesterol was 120 total, we will schedule a sleep study for the patient mainly to work on the possibility of him having sleep apnea as this could correlate with atrial fibrillation, and I will not take him off any of the medications he is currently using.  Continues to see his mental health provider for depression and depression related insomnia.   Review of Systems: Out of a complete 14 system review, the patient complains of only the following symptoms, and all other reviewed systems are negative.  REM behavior, sleep apnea , loud snoring , sleep choking, insomnia.   Epworth sleepiness score 20/ 24    , Fatigue severity score 63/ 63   , depression score:  not filled.    Social History   Socioeconomic History  . Marital status: Single    Spouse name: Not on file  . Number of children: Not on file  . Years  of education: Not on file  . Highest education level: Not on file  Social Needs  . Financial resource strain: Not on file  . Food insecurity - worry: Not on file  . Food insecurity - inability: Not on file  . Transportation needs - medical: Not on file  . Transportation needs - non-medical: Not on file  Occupational History  . Not on file  Tobacco Use  . Smoking status: Never Smoker  . Smokeless tobacco: Never Used  Substance and Sexual Activity  . Alcohol use: No    Comment: no EtOH in 20 days 09/26/17  . Drug use: No  . Sexual activity: Not Currently    Partners: Male  Other Topics Concern  . 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    Family History  Problem Relation Age of Onset  . Heart disease Mother 640       MI age 240.  Died age 63  . Stroke Mother   . Heart disease Father        Died age 6770s MI  . Heart disease Sister        No details    Past Medical History:  Diagnosis Date  . Alcohol abuse 12/21/2016  . Atrial fibrillation (HCC)   . Bipolar disorder (HCC)   . Depression   . History of blood transfusion 12-12-1998  . History of radiation exposure 08/17/2017  . HTN (hypertension) 11/17/2016  . 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 Late syphilis   . Lower back pain 11/25/2015  . 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 TIA (transient ischemic attack) 11/17/2016    Past Surgical History:  Procedure Laterality Date  . broken right leg Right 2000  . [facility]    Current Outpatient Medications  Medication Sig Dispense Refill  . ARIPiprazole (ABILIFY) 15 MG tablet Take 15 mg by mouth daily.    . bictegravir-emtricitabine-tenofovir AF (BIKTARVY) 50-200-25 MG TABS tablet Take 1 tablet by mouth daily. 30 tablet 11  . Cholecalciferol (VITAMIN D) 2000 units CAPS Take 2,000 Units by mouth daily.     . clonazePAM (KLONOPIN) 0.5 MG tablet Take 0.5 mg by mouth 3 times/day as needed-between  meals & bedtime.     . Cyanocobalamin (VITAMIN B 12 PO) Take 1,000 mg by mouth every other day.    . docusate sodium (COLACE) 100 MG capsule Take 1 capsule (100 mg total) by mouth daily. (May purchase from over the counter at the pharmacy): For constipation (Patient taking differently: Take 100 mg by mouth daily as needed for mild constipation. (May purchase from over the counter at the pharmacy): For constipation) 1 capsule 0  . DULoxetine (CYMBALTA) 60 MG capsule TAKE 2 CAPSULES(120 MG) BY MOUTH DAILY (Patient taking differently: TAKE 2 CAPSULES (120 MG) BY MOUTH DAILY) 60 capsule 3  . ferrous sulfate 325 (65 FE) MG EC tablet Take 325 mg by mouth daily.    . hydrocortisone cream 1 % Apply topically 2 (two) times daily as needed for itching. 30 g 0  . hydrOXYzine (ATARAX/VISTARIL) 50 MG tablet Take 50 mg by mouth at bedtime.    . mirtazapine (REMERON) 15 MG tablet Take 1 tablet (15 mg total) by mouth at bedtime. For depression/sleep 30 tablet 5  . prazosin (MINIPRESS) 2 MG capsule Take 2 mg by mouth at bedtime.    . rivaroxaban (XARELTO) 20 MG TABS tablet Take 1 tablet (20 mg total) by mouth daily with supper. [facility] pharmacy 30 tablet 11  . rosuvastatin (CRESTOR) 10 MG tablet Take 1 tablet (10 mg total) by mouth at bedtime. For high cholesterol 30 tablet 0  . sildenafil (VIAGRA) 100 MG tablet Take 1 tablet (100 mg total) by mouth daily as needed for erectile dysfunction. 30 tablet 5  . sotalol (BETAPACE) 80 MG tablet Take 1 tablet (80 mg total) by mouth every 12 (twelve) hours. 60 tablet 5  . tamsulosin (FLOMAX) 0.4 MG CAPS capsule Take 1 capsule (0.4 mg total) by mouth 2 (two) times daily. For prostate health 30 capsule 0  . topiramate (TOPAMAX) 50 MG tablet Take 50 mg by mouth at bedtime.      No current facility-administered medications for this visit.  Allergies as of 01/23/2018  . (No Known Allergies)    Vitals: BP 117/80   Pulse 61   Ht 5\' 10"  (1.778 m)   Wt 195 lb (88.5 kg)   BMI  27.98 kg/m  Last Weight:  Wt Readings from Last 1 Encounters:  01/23/18 195 lb (88.5 kg)   ONG:EXBM mass index is 27.98 kg/m.     Last Height:   Ht Readings from Last 1 Encounters:  01/23/18 5\' 10"  (1.778 m)    Physical exam:  General: The patient is awake, alert and appears not in acute distress. The patient is well groomed. Head: Normocephalic, atraumatic. Neck is supple. Mallampati4,  neck circumference:15. 5 . Nasal airflow congestion ,facial hair - Retrognathia is not seen. He had  Dental braces during college  Cardiovascular:  Regular rate and rhythm, without  murmurs or carotid bruit, and without distended neck veins. Respiratory: Lungs are clear to auscultation. Skin:  Without evidence of edema, or rash Trunk: BMI is 28. The patient's posture is stooped   Neurologic exam : The patient is awake and alert,attention span & concentration ability appears normal.  Speech is fluent,  without   dysarthria, dysphonia or aphasia.  Mood and affect are depressed  Cranial nerves: Pupils are equal and briskly reactive to light. Funduscopic exam without evidence of pallor or edema. Extraocular movements  in vertical and horizontal planes intact and without nystagmus. Visual fields by finger perimetry are intact. Hearing to finger rub intact.  Facial sensation intact to fine touch. Facial motor strength is symmetric,  tongue and uvula move midline. Shoulder shrug was symmetrical.   Motor exam:   Normal tone, muscle bulk and symmetric strength in all extremities. Sensory:  Fine touch, pinprick and vibration were tested in all extremities. Proprioception tested in the upper extremities was normal. Coordination: Rapid alternating movements in the fingers/hands was normal. Finger-to-nose maneuver  normal without evidence of ataxia, dysmetria or tremor. Gait and station: Patient walks without assistive device .Tandem gait is fragmented. Turns with  3-4  Steps. Romberg testing is  negative. Deep  tendon reflexes: in the  upper and lower extremities are symmetric, but attenuated . Babinski maneuver response is downgoing.    Assessment:  After physical and neurologic examination, review of laboratory studies,  Personal review of imaging studies, reports of other /same  Imaging studies, results of polysomnography and / or neurophysiology testing and pre-existing records as far as provided in visit., my assessment is   1) Mr. Blasco reports an excessive degree of daytime somnolence in the setting of major depressive episodes brought on by bipolar disorder, also keeps him often from sleeping at night.  He has racing thoughts and he had very unpleasant nightmares until he was treated with Catapres.  Current medication works well for him from a depression standpoint, makes it easier for him to enter sleep but sleep remains fragmented for various reasons.  2) Mr. Dennard just recently developed atrial fibrillation first characterized as paroxysmal in onset and then in January the diagnosis changed to persistent atrial fibrillation.  He was loaded to sotalol a medication for rate control.  This has helped him to regain a little bit of strength, he is not short of breath, he does not have ankle edema.  However in order to keep him in sinus rhythm or even consider a cardioversion he would need to be evaluated for obstructive sleep apnea, if obstructive sleep apnea or central sleep apnea are present the need to be treated to  help him maintain sinus rhythm.  3) Mr. Hanley degree of daytime sleepiness would qualify for a narcolepsy evaluation.  However he does not have sleep paralysis, dream intrusion and he acknowledges that his sleep routines are mainly absent.  There is a fluctuating pattern of bedtime, sleep time and dozing off on the sofa. Establishing a sleep hygiene routine with the help of a psychiatrist, cognitive behavioral therapy, would be a prime goal to allow him to enjoy sleep.  4) a  narcolepsy evaluation by PSG followed with an M SLT would not be invalid due to the patient's REM suppressant medication listed.  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  Disorder has been much better controlled on the current medication regimen, likely because he is taking REM sleep suppressant medication.   The patient was advised of the nature of the diagnosed disorder , the treatment options and the  risks for general health and wellness arising from not treating the condition.   I spent more than 50 minutes of face to face time with the patient.  Greater than 50% of time was spent in counseling and coordination of care. We have discussed the diagnosis and differential and I answered the patient's questions.    Plan:  Treatment plan and additional workup : My plan is to evaluate him for the presence of obstructive sleep apnea or other sleep apnea forms, with his irregular sleep pattern I am not sure if he would have a good chance of capturing any sleep at home and a home sleep test.  I think a better way would be to observe him sleeping in an attended sleep study.  I also think that he may feel safer when he is not alone and his vital signs are monitored.  For this reason I will order a split-night polysomnography to be split at an AHI of 20 and to be scored 3% desaturation.  Her main goal is to treat apnea found to make the recurrence of atrial fibrillation less likely-  through this pathway he will also be less likely to suffer strokes or TIAs.   RV with me.    Melvyn Novas, MD 01/23/2018, 1:12 PM  Certified in Neurology by ABPN Certified in Sleep Medicine by [facility] [address], [address] [place], [place] [address]

## 2018-01-23 NOTE — Patient Instructions (Signed)
Medication Instructions:  Your physician recommends that you continue on your current medications as directed. Please refer to the Current Medication list given to you today.  * If you need a refill on your cardiac medications before your next appointment, please call your pharmacy.   Labwork: None ordered  Testing/Procedures: None ordered  Follow-Up: Your physician wants you to follow-up in: 6 months with Dr. Camnitz.  You will receive a reminder letter in the mail two months in advance. If you don't receive a letter, please call our office to schedule the follow-up appointment.  Thank you for choosing [facility]!!   Aloysuis Ribaudo, RN [phone]      r 

## 2018-02-02 ENCOUNTER — Encounter: Payer: Self-pay | Admitting: Infectious Disease

## 2018-02-11 ENCOUNTER — Other Ambulatory Visit: Payer: Self-pay | Admitting: Infectious Disease

## 2018-02-11 DIAGNOSIS — F319 Bipolar disorder, unspecified: Secondary | ICD-10-CM

## 2018-02-19 ENCOUNTER — Encounter (INDEPENDENT_AMBULATORY_CARE_PROVIDER_SITE_OTHER): Payer: No Typology Code available for payment source | Admitting: *Deleted

## 2018-02-19 VITALS — BP 110/77 | HR 53 | Temp 98.1°F | Wt 192.5 lb

## 2018-02-19 DIAGNOSIS — Z006 Encounter for examination for normal comparison and control in clinical research program: Secondary | ICD-10-CM

## 2018-02-19 NOTE — Progress Notes (Signed)
Arthur [name] is here for his month 32 visit for Reprieve. He continues to be in and out of AFIB taking sotalol and xarelto now. He says his depression is still bad and he goes to counseling 3 days a week at [facility], but is going to see if Franne FortsKenny Shore will see him at [facility]. He will be returning in June for his month 36 visit.

## 2018-02-21 ENCOUNTER — Other Ambulatory Visit: Payer: Self-pay | Admitting: Neurology

## 2018-02-21 DIAGNOSIS — I4891 Unspecified atrial fibrillation: Secondary | ICD-10-CM

## 2018-03-05 ENCOUNTER — Telehealth: Payer: Self-pay | Admitting: Cardiology

## 2018-03-05 ENCOUNTER — Other Ambulatory Visit: Payer: Self-pay | Admitting: Infectious Disease

## 2018-03-05 ENCOUNTER — Telehealth: Payer: Self-pay | Admitting: *Deleted

## 2018-03-05 DIAGNOSIS — B20 Human immunodeficiency virus [HIV] disease: Secondary | ICD-10-CM

## 2018-03-05 DIAGNOSIS — N400 Enlarged prostate without lower urinary tract symptoms: Secondary | ICD-10-CM

## 2018-03-05 DIAGNOSIS — I480 Paroxysmal atrial fibrillation: Secondary | ICD-10-CM

## 2018-03-05 MED ORDER — TAMSULOSIN HCL 0.4 MG PO CAPS: 0.4000 mg | ORAL_CAPSULE | Freq: Two times a day (BID) | ORAL | 11 refills | Status: AC

## 2018-03-05 MED FILL — TAMSULOSIN HCL 0.4 MG CAP: 0.4 | 30 days supply | Qty: 60 | Fill #0

## 2018-03-05 NOTE — Telephone Encounter (Signed)
Thanks Michelle

## 2018-03-05 NOTE — Telephone Encounter (Signed)
Patient is asking if Dr Daiva EvesVan Dam would refill his tamsulosin (was reordered during hospitalization, originally written by Dr Daiva EvesVan Dam for 1 year at a time) and viagra (sent to Timmothy SoursAshley Rehner). Will send ref[facility] of [facility]ulosin [facility]. Andree CossHowell, Sequoia Witz M, RN

## 2018-03-05 NOTE — Telephone Encounter (Signed)
New message ° ° °Patient calling the office for samples of medication: ° ° °1.  What medication and dosage are you requesting samples for?rivaroxaban (XARELTO) 20 MG TABS tablet ° °2.  Are you currently out of this medication? Yes  ° ° °

## 2018-03-06 ENCOUNTER — Encounter: Payer: Self-pay | Admitting: Physical Therapy

## 2018-03-06 NOTE — Therapy (Signed)
[facility] [address] [place], [place], [address] Phone: [phone]   Fax:  [phone]  Patient Details  Name: Arthur [name] MRN: [id number] Date of Birth: [birth year] Referring Provider:  Burgess Estelle, MD  Encounter Date: 03/06/2018  PHYSICAL THERAPY DISCHARGE SUMMARY  Visits from Start of Care: 9  Current functional level related to goals / functional outcomes: Patient was making progress but was admitted for A-fib issues & did not return for PT. PT Short Term Goals - 11/23/17 1410      PT SHORT TERM GOAL #1   Title  Patient demonstrates understanding of intial HEP. (All STGs Target Date: 11/15/2017)    Baseline  11/23/17: met today    Status  Achieved      PT SHORT TERM GOAL #2   Title  Berg Balance >45/56    Baseline  11/23/17: 49/56 today    Time  --    Period  --    Status  Achieved      PT SHORT TERM GOAL #3   Title  Patient ambulates with cane 500' iwth supervision.     Baseline  11/23/17: met today     Time  --    Period  --    Status  Achieved        Remaining deficits: Unknown as did not return to PT  Education / Equipment: HEP Plan: Patient agrees to discharge.  Patient goals were not met. Patient is being discharged due to a change in medical status.  ?????        Jacoya Bauman PT, DPT 03/06/2018, 10:59 AM  [facility] [address] [place], [place], [address] Phone: [phone]   Fax:  [phone]

## 2018-03-09 ENCOUNTER — Other Ambulatory Visit: Payer: Self-pay | Admitting: Infectious Disease

## 2018-03-09 DIAGNOSIS — F330 Major depressive disorder, recurrent, mild: Secondary | ICD-10-CM

## 2018-03-09 NOTE — Telephone Encounter (Signed)
Patient aware. STRONGLY encouraged him to get enough from the pharmacy until he gets samples Monday, stated he had current Rx.

## 2018-03-09 NOTE — Telephone Encounter (Signed)
Xarelto 20 mg #3 Lot # [id number] exp 3/21

## 2018-03-14 ENCOUNTER — Encounter: Payer: Self-pay | Admitting: Pharmacist

## 2018-03-14 NOTE — Progress Notes (Signed)
Patient was approved for free rivaroxaban (Xarelto) approved through manufacturer patient assistance program, good for 1 year. Patient notified.

## 2018-03-16 ENCOUNTER — Telehealth: Payer: Self-pay

## 2018-03-16 NOTE — Telephone Encounter (Signed)
Pt called today wondering if his Viagra was ready to pick up today. Pt needs to talk to DumasAshley before he gets this refill. Had pt leave a vm for Morrie Sheldonshley to call him back Monday for assistance. Lorenso CourierJose L Maldonado, [place]

## 2018-03-19 ENCOUNTER — Telehealth: Payer: Self-pay

## 2018-03-19 ENCOUNTER — Ambulatory Visit: Payer: Self-pay

## 2018-03-19 NOTE — Telephone Encounter (Signed)
Pt had a sleep study scheduled for 03/18/18 and DID NOT show up for that appointment. Pt was confirmed on 03/15/18 by Sheena.

## 2018-03-29 ENCOUNTER — Telehealth: Payer: Self-pay | Admitting: *Deleted

## 2018-03-29 NOTE — Telephone Encounter (Signed)
Patient called to ask about his PA for Viagra. Advised him no medication on hand for him. He asked to speak with Arthur [name] to order his refill and she was away from her desk and will have her call him. He advised he has tried several times to contact her and she does not call him back. Advised will have her call him as soon as she is available.

## 2018-04-12 ENCOUNTER — Encounter: Payer: Self-pay | Admitting: Internal Medicine

## 2018-05-01 ENCOUNTER — Telehealth: Payer: Self-pay | Admitting: Behavioral Health

## 2018-05-01 NOTE — Telephone Encounter (Signed)
Patient called asking if his Viagra had arrived through the patient assistance program.  Medication has not yet arrived and advised patient to call back tomorrow.  The medication was shipped out 04/24/2018 and this was confirmed with [name]. Angeline Slim RN

## 2018-06-04 ENCOUNTER — Encounter: Payer: Self-pay | Admitting: *Deleted

## 2018-06-05 ENCOUNTER — Encounter (INDEPENDENT_AMBULATORY_CARE_PROVIDER_SITE_OTHER): Payer: Self-pay | Admitting: *Deleted

## 2018-06-05 VITALS — BP 124/89 | HR 70 | Temp 98.8°F | Wt 208.5 lb

## 2018-06-05 DIAGNOSIS — Z006 Encounter for examination for normal comparison and control in clinical research program: Secondary | ICD-10-CM

## 2018-06-05 DIAGNOSIS — B20 Human immunodeficiency virus [HIV] disease: Secondary | ICD-10-CM

## 2018-06-05 NOTE — Progress Notes (Signed)
Arthur [name] is here for his month 36 visit for Reprieve. He says he is still battling with depression that comes and goes. He is noticing his pulse racing at times and that the next step for his afib is doing an ablation. He denies any new problems or concerns. He will be returning in October for the next visit.

## 2018-06-06 LAB — T-HELPER CELL (CD4) - (RCID CLINIC ONLY)
CD4 % Helper T Cell: 40 % (ref 33–55)
CD4 T CELL ABS: 1020 /uL (ref 400–2700)

## 2018-06-07 LAB — CBC WITH DIFFERENTIAL/PLATELET
BASOS ABS: 59 {cells}/uL (ref 0–200)
BASOS PCT: 1.1 %
EOS PCT: 3.2 %
Eosinophils Absolute: 173 cells/uL (ref 15–500)
HEMATOCRIT: 39.6 % (ref 38.5–50.0)
HEMOGLOBIN: 13.5 g/dL (ref 13.2–17.1)
Lymphs Abs: 2614 cells/uL (ref 850–3900)
MCH: 33.2 pg — ABNORMAL HIGH (ref 27.0–33.0)
MCHC: 34.1 g/dL (ref 32.0–36.0)
MCV: 97.3 fL (ref 80.0–100.0)
MONOS PCT: 8.8 %
MPV: 10.3 fL (ref 7.5–12.5)
NEUTROS ABS: 2079 {cells}/uL (ref 1500–7800)
Neutrophils Relative %: 38.5 %
Platelets: 191 10*3/uL (ref 140–400)
RBC: 4.07 10*6/uL — AB (ref 4.20–5.80)
RDW: 12.6 % (ref 11.0–15.0)
Total Lymphocyte: 48.4 %
WBC mixed population: 475 cells/uL (ref 200–950)
WBC: 5.4 10*3/uL (ref 3.8–10.8)

## 2018-06-07 LAB — HIV-1 RNA QUANT-NO REFLEX-BLD
HIV 1 RNA Quant: <20 copies/mL — AB
HIV-1 RNA QUANT, LOG: DETECTED {Log_copies}/mL — AB

## 2018-06-07 LAB — LIPID PANEL
CHOLESTEROL: 156 mg/dL (ref <200)
HDL: 67 mg/dL (ref >40)
LDL Cholesterol (Calc): 74 mg/dL (calc)
Non-HDL Cholesterol (Calc): 89 mg/dL (calc) (ref <130)
Total CHOL/HDL Ratio: 2.3 (calc) (ref <5.0)
Triglycerides: 70 mg/dL (ref <150)

## 2018-06-07 LAB — RPR: RPR: NONREACTIVE

## 2018-06-21 ENCOUNTER — Encounter: Payer: Self-pay | Admitting: Internal Medicine

## 2018-06-21 ENCOUNTER — Ambulatory Visit: Payer: Self-pay

## 2018-06-21 NOTE — Assessment & Plan Note (Deleted)
Patients mood is currently stable. He Denies mania and SI/HI. He has been seen by inpatient psychiatry and received a referral to outpatient psychiatry as he is on multiple psychiatric medications. He did not make it to his appointment. He is currently ------ interested in out patient psychiatry. Will provide patient a new referral. ---- - Referral to Psychiatry

## 2018-06-21 NOTE — Assessment & Plan Note (Deleted)
Patient has a history of persistent atrial fibrillation. He was admitted on 12/18/08 for sotalol loading. He is anticoagulated with Xarelto. He follows with cardiology (EP) and is to follow up with Dr. Elberta Fortisamnitz in the next couple of months. He has declined intervention (ablation) thus far. - Sotalol 80mg  q12h - Follow up with Electrophysiology

## 2018-06-21 NOTE — Progress Notes (Deleted)
   CC: ***  HPI:  Mr.Anand Ladona Ridgelaylor is a 62 y.o. M with PMHx listed below presenting for ***. Please see the A&P for the status of the patient's chronic medical problems.   Past Medical History:  Diagnosis Date  . Alcohol abuse 12/21/2016  . Atrial fibrillation (HCC)   . Bipolar disorder (HCC)   . Depression   . History of blood transfusion 12-12-1998  . History of radiation exposure 08/17/2017  . HTN (hypertension) 11/17/2016  . 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 Late syphilis   . Lower back pain 11/25/2015  . 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 Syncope due to orthostatic hypotension 10/04/2017  . TIA (transient ischemic attack) 11/17/2016   Review of Systems: Performed and all others negative.  Physical Exam:  There were no vitals filed for this visit. Physical Exam   Assessment & Plan:   See Encounters Tab for problem based charting.  Patient {GC/GE:3044014::"discussed with","seen with"} Dr. {NAMES:[id number]::"Butcher","Granfortuna","E. Hoffman","Klima","Mullen","Narendra","Raines","Vincent"}

## 2018-06-21 NOTE — Assessment & Plan Note (Deleted)
Patient has a history of persistent atrial fibrillation. He was admitted on 12/18/08 for sotalol loading. He is anticoagulated with Xarelto. He follows with cardiology (EP) and is to follow up with Dr. Camnitz in the next couple of months. He has declined intervention (ablation) thus far. - Sotalol 80mg q12h - Follow up with Electrophysiology 

## 2018-06-21 NOTE — Assessment & Plan Note (Deleted)
BP today --- /----. This is ---- from previous of 136/110. His HCTZ has been discontinued due to soft blood pressure and associated syncopal episode believed to due to hypotension and atrial fibrillation. He was started on sotalol for his atrial fibrillation. -  Continue to monitor

## 2018-06-25 ENCOUNTER — Ambulatory Visit (INDEPENDENT_AMBULATORY_CARE_PROVIDER_SITE_OTHER): Payer: Self-pay | Admitting: Family

## 2018-06-25 ENCOUNTER — Encounter: Payer: Self-pay | Admitting: Family

## 2018-06-25 ENCOUNTER — Telehealth: Payer: Self-pay | Admitting: *Deleted

## 2018-06-25 VITALS — BP 121/80 | HR 92 | Temp 97.9°F | Wt 207.0 lb

## 2018-06-25 DIAGNOSIS — R21 Rash and other nonspecific skin eruption: Secondary | ICD-10-CM | POA: Insufficient documentation

## 2018-06-25 MED ORDER — PERMETHRIN 5 % EX CREA
TOPICAL_CREAM | CUTANEOUS | 0 refills | Status: DC
Start: 2018-06-25 — End: 2018-09-12

## 2018-06-25 MED ORDER — TRIAMCINOLONE ACETONIDE 0.1 % EX CREA
1.0000 | TOPICAL_CREAM | Freq: Two times a day (BID) | CUTANEOUS | 0 refills | Status: DC
Start: 2018-06-25 — End: 2018-09-12

## 2018-06-25 NOTE — Telephone Encounter (Signed)
Arthur [name] called and said he thinks he has scabies. He has a few red dots, and is itching really bad especially under his arms and groin areas. Will have him come in and be seen this afternoon.

## 2018-06-25 NOTE — Progress Notes (Signed)
Subjective:    Patient ID: Arthur [name], male    DOB: August 09, 1956, 62 y.o.   MRN: [id number]  Chief Complaint  Patient presents with  . Rash    on shoulder, leg and groin     HPI:  Arthur [name] is a 62 y.o. male who presents today for an acute office visit.   This is a new problem. Associated symptom of a rash described as itchy has been going on for about 3 weeks located on his left shoulder/axilla, and bilateral groin. He has concern for lice and treated it with Nit which did not help very much. Course of symptoms has worsened since initial onset with timing of symptoms worse at night. No fevers, chills or sweats. He did have sex with a condom about 1 month ago. His partner had a rash and informed him after.   No Known Allergies    Outpatient Medications Prior to Visit  Medication Sig Dispense Refill  . ARIPiprazole (ABILIFY) 15 MG tablet Take 15 mg by mouth daily.    . bictegravir-emtricitabine-tenofovir AF (BIKTARVY) 50-200-25 MG TABS tablet Take 1 tablet by mouth daily. 30 tablet 11  . Cholecalciferol (VITAMIN D) 2000 units CAPS Take 2,000 Units by mouth daily.     . clonazePAM (KLONOPIN) 0.5 MG tablet Take 0.5 mg by mouth 3 times/day as needed-between meals & bedtime.     . Cyanocobalamin (VITAMIN B 12 PO) Take 1,000 mg by mouth every other day.    . DULoxetine (CYMBALTA) 60 MG capsule TAKE 2 CAPSULES (120 MG) BY MOUTH DAILY 60 capsule 3  . ferrous sulfate 325 (65 FE) MG EC tablet Take 325 mg by mouth daily.    . hydrocortisone cream 1 % Apply topically 2 (two) times daily as needed for itching. 30 g 0  . hydrOXYzine (ATARAX/VISTARIL) 50 MG tablet Take 50 mg by mouth at bedtime.    . mirtazapine (REMERON) 15 MG tablet TAKE 1 TABLET(15 MG) BY MOUTH AT BEDTIME FOR DEPRESSION OR SLEEP 30 tablet 5  . prazosin (MINIPRESS) 2 MG capsule Take 2 mg by mouth at bedtime.    . rivaroxaban (XARELTO) 20 MG TABS tablet Take 1 tablet (20 mg total) by mouth daily with supper. [facility]  30 tablet 11  . rosuvastatin (CRESTOR) 10 MG tablet Take 1 tablet (10 mg total) by mouth at bedtime. For high cholesterol 30 tablet 0  . sildenafil (VIAGRA) 100 MG tablet Take 1 tablet (100 mg total) by mouth daily as needed for erectile dysfunction. 30 tablet 5  . sotalol (BETAPACE) 80 MG tablet Take 1 tablet (80 mg total) by mouth every 12 (twelve) hours. 60 tablet 5  . tamsulosin (FLOMAX) 0.4 MG CAPS capsule Take 1 capsule (0.4 mg total) by mouth 2 (two) times daily. For prostate health 60 capsule 11  . topiramate (TOPAMAX) 50 MG tablet Take 50 mg by mouth at bedtime.     . docusate sodium (COLACE) 100 MG capsule Take 1 capsule (100 mg total) by mouth daily. (May purchase from over the counter at the pharmacy): For constipation (Patient not taking: Reported on 06/25/2018) 1 capsule 0   No facility-administered medications prior to visit.      Past Medical History:  Diagnosis Date  . Alcohol abuse 12/21/2016  . Atrial fibrillation (HCC)   . Bipolar disorder (HCC)   . Depression   . History of blood transfusion 12-12-1998  . History of radiation exposure 08/17/2017  . HTN (hypertension) 11/17/2016  . Hypoglycemia after  GI (gastrointestinal) surgery 06/20/2016  . Late syphilis   . Lower back pain 11/25/2015  . 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 Syncope due to orthostatic hypotension 10/04/2017  . TIA (transient ischemic attack) 11/17/2016     Past Surgical History:  Procedure Laterality Date  . broken right leg Right 2000  . GASTRIC BYPASS  2012   [facility]       Review of Systems  Constitutional: Negative for chills and fever.  Respiratory: Negative for chest tightness, shortness of breath and wheezing.   Cardiovascular: Negative for chest pain.  Skin: Positive for rash.      Objective:    BP 121/80   Pulse 92   Temp 97.9 F (36.6 C) (Oral)   Wt 207 lb (93.9 kg)   BMI 29.70 kg/m  Nursing note and vital signs reviewed.  Physical Exam  Constitutional: He  is oriented to person, place, and time. He appears well-developed and well-nourished. No distress.  Cardiovascular: Normal rate, regular rhythm, normal heart sounds and intact distal pulses.  Pulmonary/Chest: Effort normal and breath sounds normal. No stridor. No respiratory distress. He has no wheezes.  Neurological: He is alert and oriented to person, place, and time.  Skin: Skin is warm and dry. Rash noted.  Rash located in groin appears with redness and annular lesions with red borders and darker centers.   Psychiatric: He has a normal mood and affect.       Assessment & Plan:   Problem List Items Addressed This Visit      Musculoskeletal and Integument   Rash and nonspecific skin eruption - Primary    New onset rash located in bilateral axillas and groin with concern for scabies. Will treat with permetherin cream. Triamcinolone cream as needed for itching. Coupons provided through [employer]. Follow up if symptoms worsen or do not improve.       Relevant Medications   permethrin (ELIMITE) 5 % cream   triamcinolone cream (KENALOG) 0.1 %       I am having Arthur Lack start on permethrin and triamcinolone cream. I am also having him maintain his docusate sodium, hydrocortisone cream, rosuvastatin, bictegravir-emtricitabine-tenofovir AF, sildenafil, topiramate, prazosin, clonazePAM, Vitamin D, Cyanocobalamin (VITAMIN B 12 PO), hydrOXYzine, ARIPiprazole, ferrous sulfate, sotalol, rivaroxaban, mirtazapine, tamsulosin, and DULoxetine.   Meds ordered this encounter  Medications  . permethrin (ELIMITE) 5 % cream    Sig: Apply to skin from head to soles of feet once. Leave on for 8 to 14 hours then rinse off.    Dispense:  60 g    Refill:  0    Order Specific Question:   Supervising Provider    Answer:   Judyann Munson [[id number]]  . triamcinolone cream (KENALOG) 0.1 %    Sig: Apply 1 application topically 2 (two) times daily.    Dispense:  30 g    Refill:  0    Order  Specific Question:   Supervising Provider    Answer:   Judyann Munson [[id number]]     Follow-up: Return if symptoms worsen or fail to improve.   Marcos Eke, MSN, [facility]

## 2018-06-25 NOTE — Patient Instructions (Addendum)
Nice to meet you.  We will treat you for scabies with premetharin cream.   Use the triamcinalone as needed up to twice daily for itching.

## 2018-06-25 NOTE — Assessment & Plan Note (Signed)
New onset rash located in bilateral axillas and groin with concern for scabies. Will treat with permetherin cream. Triamcinolone cream as needed for itching. Coupons provided through [employer]. Follow up if symptoms worsen or do not improve.

## 2018-07-11 MED FILL — TAMSULOSIN HCL 0.4 MG CAP: 0.4 | 30 days supply | Qty: 60 | Fill #1

## 2018-08-10 MED FILL — TAMSULOSIN HCL 0.4 MG CAP: 0.4 | 30 days supply | Qty: 60 | Fill #2

## 2018-08-28 ENCOUNTER — Other Ambulatory Visit: Payer: Self-pay | Admitting: Infectious Disease

## 2018-09-11 ENCOUNTER — Inpatient Hospital Stay (HOSPITAL_COMMUNITY): Payer: Medicaid Other

## 2018-09-11 ENCOUNTER — Inpatient Hospital Stay (HOSPITAL_COMMUNITY)
Admission: EM | Admit: 2018-09-11 | Discharge: 2018-09-27 | DRG: 065 | Disposition: A | Discharge status: Skilled Nursing Facility | Payer: Medicaid Other | Attending: Internal Medicine | Admitting: Internal Medicine

## 2018-09-11 ENCOUNTER — Emergency Department (HOSPITAL_COMMUNITY): Payer: Medicaid Other

## 2018-09-11 ENCOUNTER — Encounter (HOSPITAL_COMMUNITY): Payer: Self-pay | Admitting: Emergency Medicine

## 2018-09-11 ENCOUNTER — Other Ambulatory Visit: Payer: Self-pay

## 2018-09-11 DIAGNOSIS — I871 Compression of vein: Secondary | ICD-10-CM | POA: Diagnosis present

## 2018-09-11 DIAGNOSIS — E872 Acidosis, unspecified: Secondary | ICD-10-CM

## 2018-09-11 DIAGNOSIS — E785 Hyperlipidemia, unspecified: Secondary | ICD-10-CM | POA: Diagnosis present

## 2018-09-11 DIAGNOSIS — Z8673 Personal history of transient ischemic attack (TIA), and cerebral infarction without residual deficits: Secondary | ICD-10-CM

## 2018-09-11 DIAGNOSIS — I4819 Other persistent atrial fibrillation: Secondary | ICD-10-CM | POA: Diagnosis present

## 2018-09-11 DIAGNOSIS — R410 Disorientation, unspecified: Secondary | ICD-10-CM

## 2018-09-11 DIAGNOSIS — Z8249 Family history of ischemic heart disease and other diseases of the circulatory system: Secondary | ICD-10-CM | POA: Diagnosis not present

## 2018-09-11 DIAGNOSIS — G9349 Other encephalopathy: Secondary | ICD-10-CM | POA: Diagnosis present

## 2018-09-11 DIAGNOSIS — F319 Bipolar disorder, unspecified: Secondary | ICD-10-CM | POA: Diagnosis present

## 2018-09-11 DIAGNOSIS — R29713 NIHSS score 13: Secondary | ICD-10-CM | POA: Diagnosis present

## 2018-09-11 DIAGNOSIS — R791 Abnormal coagulation profile: Secondary | ICD-10-CM | POA: Diagnosis present

## 2018-09-11 DIAGNOSIS — Z9884 Bariatric surgery status: Secondary | ICD-10-CM | POA: Diagnosis not present

## 2018-09-11 DIAGNOSIS — N179 Acute kidney failure, unspecified: Secondary | ICD-10-CM

## 2018-09-11 DIAGNOSIS — B20 Human immunodeficiency virus [HIV] disease: Secondary | ICD-10-CM | POA: Diagnosis present

## 2018-09-11 DIAGNOSIS — F1020 Alcohol dependence, uncomplicated: Secondary | ICD-10-CM | POA: Diagnosis present

## 2018-09-11 DIAGNOSIS — Z823 Family history of stroke: Secondary | ICD-10-CM | POA: Diagnosis not present

## 2018-09-11 DIAGNOSIS — R4701 Aphasia: Secondary | ICD-10-CM

## 2018-09-11 DIAGNOSIS — I63412 Cerebral infarction due to embolism of left middle cerebral artery: Principal | ICD-10-CM | POA: Diagnosis present

## 2018-09-11 DIAGNOSIS — I63522 Cerebral infarction due to unspecified occlusion or stenosis of left anterior cerebral artery: Secondary | ICD-10-CM

## 2018-09-11 DIAGNOSIS — Z21 Asymptomatic human immunodeficiency virus [HIV] infection status: Secondary | ICD-10-CM

## 2018-09-11 DIAGNOSIS — Z79899 Other long term (current) drug therapy: Secondary | ICD-10-CM | POA: Diagnosis not present

## 2018-09-11 DIAGNOSIS — Z7901 Long term (current) use of anticoagulants: Secondary | ICD-10-CM

## 2018-09-11 DIAGNOSIS — I10 Essential (primary) hypertension: Secondary | ICD-10-CM | POA: Diagnosis present

## 2018-09-11 DIAGNOSIS — I639 Cerebral infarction, unspecified: Secondary | ICD-10-CM

## 2018-09-11 DIAGNOSIS — Z923 Personal history of irradiation: Secondary | ICD-10-CM

## 2018-09-11 DIAGNOSIS — R29810 Facial weakness: Secondary | ICD-10-CM | POA: Diagnosis present

## 2018-09-11 DIAGNOSIS — I480 Paroxysmal atrial fibrillation: Secondary | ICD-10-CM

## 2018-09-11 DIAGNOSIS — F1011 Alcohol abuse, in remission: Secondary | ICD-10-CM

## 2018-09-11 HISTORY — DX: Hyperlipidemia, unspecified: E78.5

## 2018-09-11 HISTORY — DX: Acidosis, unspecified: E87.20

## 2018-09-11 HISTORY — DX: Human immunodeficiency virus (HIV) disease: B20

## 2018-09-11 HISTORY — DX: Cerebral infarction, unspecified: I63.9

## 2018-09-11 HISTORY — DX: Acidosis: E87.2

## 2018-09-11 HISTORY — DX: Asymptomatic human immunodeficiency virus (hiv) infection status: Z21

## 2018-09-11 HISTORY — DX: Acute kidney failure, unspecified: N17.9

## 2018-09-11 LAB — I-STAT CHEM 8, ED
BUN: 23 mg/dL (ref 8–23)
CHLORIDE: 101 mmol/L (ref 98–111)
Calcium, Ion: 1.05 mmol/L — ABNORMAL LOW (ref 1.15–1.40)
Creatinine, Ser: 1.7 mg/dL — ABNORMAL HIGH (ref 0.61–1.24)
Glucose, Bld: 144 mg/dL — ABNORMAL HIGH (ref 70–99)
HEMATOCRIT: 50 % (ref 39.0–52.0)
Hemoglobin: 17 g/dL (ref 13.0–17.0)
POTASSIUM: 4.4 mmol/L (ref 3.5–5.1)
Sodium: 135 mmol/L (ref 135–145)
TCO2: 20 mmol/L — ABNORMAL LOW (ref 22–32)

## 2018-09-11 LAB — URINALYSIS, ROUTINE W REFLEX MICROSCOPIC
Bacteria, UA: NONE SEEN
Bilirubin Urine: NEGATIVE
GLUCOSE, UA: NEGATIVE mg/dL
Ketones, ur: 20 mg/dL — AB
Leukocytes, UA: NEGATIVE
Nitrite: NEGATIVE
PROTEIN: NEGATIVE mg/dL
SPECIFIC GRAVITY, URINE: 1.043 — AB (ref 1.005–1.030)
pH: 5 (ref 5.0–8.0)

## 2018-09-11 LAB — COMPREHENSIVE METABOLIC PANEL
ALK PHOS: 59 U/L (ref 38–126)
ALT: 11 U/L (ref 0–44)
AST: 24 U/L (ref 15–41)
Albumin: 3.7 g/dL (ref 3.5–5.0)
Anion gap: 17 — ABNORMAL HIGH (ref 5–15)
BILIRUBIN TOTAL: 1.2 mg/dL (ref 0.3–1.2)
BUN: 18 mg/dL (ref 8–23)
CALCIUM: 9.2 mg/dL (ref 8.9–10.3)
CO2: 19 mmol/L — ABNORMAL LOW (ref 22–32)
CREATININE: 1.85 mg/dL — AB (ref 0.61–1.24)
Chloride: 100 mmol/L (ref 98–111)
GFR, EST AFRICAN AMERICAN: 43 mL/min — AB (ref >60)
GFR, EST NON AFRICAN AMERICAN: 37 mL/min — AB (ref >60)
Glucose, Bld: 149 mg/dL — ABNORMAL HIGH (ref 70–99)
Potassium: 4.2 mmol/L (ref 3.5–5.1)
SODIUM: 136 mmol/L (ref 135–145)
TOTAL PROTEIN: 6.7 g/dL (ref 6.5–8.1)

## 2018-09-11 LAB — RAPID URINE DRUG SCREEN, HOSP PERFORMED
AMPHETAMINES: NOT DETECTED
Barbiturates: NOT DETECTED
Benzodiazepines: NOT DETECTED
Cocaine: NOT DETECTED
OPIATES: NOT DETECTED
Tetrahydrocannabinol: NOT DETECTED

## 2018-09-11 LAB — I-STAT TROPONIN, ED: TROPONIN I, POC: 0 ng/mL (ref 0.00–0.08)

## 2018-09-11 LAB — PROTIME-INR
INR: 1.12
Prothrombin Time: 14.3 seconds (ref 11.4–15.2)

## 2018-09-11 LAB — CBC WITH DIFFERENTIAL/PLATELET
Abs Immature Granulocytes: 0.1 10*3/uL (ref 0.0–0.1)
BASOS PCT: 0 %
Basophils Absolute: 0 10*3/uL (ref 0.0–0.1)
EOS ABS: 0 10*3/uL (ref 0.0–0.7)
EOS PCT: 1 %
HCT: 48.7 % (ref 39.0–52.0)
HEMOGLOBIN: 15.9 g/dL (ref 13.0–17.0)
Immature Granulocytes: 1 %
LYMPHS PCT: 23 %
Lymphs Abs: 2 10*3/uL (ref 0.7–4.0)
MCH: 31.8 pg (ref 26.0–34.0)
MCHC: 32.6 g/dL (ref 30.0–36.0)
MCV: 97.4 fL (ref 78.0–100.0)
MONO ABS: 0.7 10*3/uL (ref 0.1–1.0)
Monocytes Relative: 8 %
Neutro Abs: 5.9 10*3/uL (ref 1.7–7.7)
Neutrophils Relative %: 67 %
Platelets: 238 10*3/uL (ref 150–400)
RBC: 5 MIL/uL (ref 4.22–5.81)
RDW: 11.7 % (ref 11.5–15.5)
WBC: 8.8 10*3/uL (ref 4.0–10.5)

## 2018-09-11 LAB — AMMONIA: Ammonia: 26 umol/L (ref 9–35)

## 2018-09-11 LAB — LACTIC ACID, PLASMA: Lactic Acid, Venous: 1.6 mmol/L (ref 0.5–1.9)

## 2018-09-11 LAB — APTT: APTT: 33 s (ref 24–36)

## 2018-09-11 LAB — ETHANOL

## 2018-09-11 LAB — I-STAT CG4 LACTIC ACID, ED: LACTIC ACID, VENOUS: 6.88 mmol/L — AB (ref 0.5–1.9)

## 2018-09-11 LAB — TSH: TSH: 1.731 u[IU]/mL (ref 0.350–4.500)

## 2018-09-11 MED ORDER — TIROFIBAN HCL IN NACL 5-0.9 MG/100ML-% IV SOLN
INTRAVENOUS | Status: AC
  Filled 2018-09-11: qty 100

## 2018-09-11 MED ORDER — ENOXAPARIN SODIUM 40 MG/0.4ML ~~LOC~~ SOLN
40.0000 mg | SUBCUTANEOUS | Status: DC
  Administered 2018-09-11: 40 mg via SUBCUTANEOUS
  Filled 2018-09-11: qty 0.4

## 2018-09-11 MED ORDER — ASPIRIN 300 MG RE SUPP: 300.0000 mg | Freq: Every day | RECTAL | Status: DC

## 2018-09-11 MED ORDER — LIDOCAINE HCL 1 % IJ SOLN
INTRAMUSCULAR | Status: AC
  Filled 2018-09-11: qty 20

## 2018-09-11 MED ORDER — ASPIRIN 325 MG PO TABS
ORAL_TABLET | ORAL | Status: AC
  Filled 2018-09-11: qty 1

## 2018-09-11 MED ORDER — TICAGRELOR 90 MG PO TABS
ORAL_TABLET | ORAL | Status: AC
  Filled 2018-09-11: qty 2

## 2018-09-11 MED ORDER — BICTEGRAVIR-EMTRICITAB-TENOFOV 50-200-25 MG PO TABS
1.0000 | ORAL_TABLET | Freq: Every day | ORAL | Status: DC
  Administered 2018-09-12 – 2018-09-27 (×16): 1 via ORAL
  Filled 2018-09-11 (×17): qty 1

## 2018-09-11 MED ORDER — DULOXETINE HCL 60 MG PO CPEP
120.0000 mg | ORAL_CAPSULE | Freq: Every day | ORAL | Status: DC
  Administered 2018-09-12 – 2018-09-27 (×16): 120 mg via ORAL
  Filled 2018-09-11 (×16): qty 2

## 2018-09-11 MED ORDER — STROKE: EARLY STAGES OF RECOVERY BOOK
Freq: Once | Status: AC
  Administered 2018-09-25: 14:00:00
  Filled 2018-09-11: qty 1

## 2018-09-11 MED ORDER — ROSUVASTATIN CALCIUM 20 MG PO TABS: 20.0000 mg | ORAL_TABLET | Freq: Every day | ORAL | Status: DC

## 2018-09-11 MED ORDER — IOPAMIDOL (ISOVUE-370) INJECTION 76%
100.0000 mL | Freq: Once | INTRAVENOUS | Status: AC | PRN
  Administered 2018-09-11: 100 mL via INTRAVENOUS

## 2018-09-11 MED ORDER — ACETAMINOPHEN 325 MG PO TABS
650.0000 mg | ORAL_TABLET | ORAL | Status: DC | PRN
  Administered 2018-09-15 – 2018-09-24 (×2): 650 mg via ORAL
  Filled 2018-09-11 (×2): qty 2

## 2018-09-11 MED ORDER — NITROGLYCERIN 1 MG/10 ML FOR IR/CATH LAB
INTRA_ARTERIAL | Status: AC
  Filled 2018-09-11: qty 10

## 2018-09-11 MED ORDER — ARIPIPRAZOLE 5 MG PO TABS
15.0000 mg | ORAL_TABLET | Freq: Every day | ORAL | Status: DC
  Administered 2018-09-12 – 2018-09-27 (×16): 15 mg via ORAL
  Filled 2018-09-11 (×16): qty 1

## 2018-09-11 MED ORDER — MIRTAZAPINE 15 MG PO TABS
15.0000 mg | ORAL_TABLET | Freq: Every day | ORAL | Status: DC
  Administered 2018-09-12 – 2018-09-26 (×15): 15 mg via ORAL
  Filled 2018-09-11 (×15): qty 1

## 2018-09-11 MED ORDER — TAMSULOSIN HCL 0.4 MG PO CAPS
0.4000 mg | ORAL_CAPSULE | Freq: Two times a day (BID) | ORAL | Status: DC
  Administered 2018-09-12 – 2018-09-27 (×31): 0.4 mg via ORAL
  Filled 2018-09-11 (×31): qty 1

## 2018-09-11 MED ORDER — ASPIRIN 325 MG PO TABS
325.0000 mg | ORAL_TABLET | Freq: Every day | ORAL | Status: DC
  Administered 2018-09-12 – 2018-09-14 (×3): 325 mg via ORAL
  Filled 2018-09-11 (×3): qty 1

## 2018-09-11 MED ORDER — SODIUM CHLORIDE 0.9 % IV SOLN
INTRAVENOUS | Status: DC
  Administered 2018-09-11: 1000 mL via INTRAVENOUS

## 2018-09-11 MED ORDER — SENNOSIDES-DOCUSATE SODIUM 8.6-50 MG PO TABS
1.0000 | ORAL_TABLET | Freq: Two times a day (BID) | ORAL | Status: DC
  Administered 2018-09-12 – 2018-09-27 (×29): 1 via ORAL
  Filled 2018-09-11 (×30): qty 1

## 2018-09-11 MED ORDER — EPTIFIBATIDE 20 MG/10ML IV SOLN
INTRAVENOUS | Status: AC
  Filled 2018-09-11: qty 10

## 2018-09-11 MED ORDER — ACETAMINOPHEN 650 MG RE SUPP: 650.0000 mg | RECTAL | Status: DC | PRN

## 2018-09-11 MED ORDER — SODIUM CHLORIDE 0.9 % IV BOLUS
1000.0000 mL | Freq: Once | INTRAVENOUS | Status: AC
  Administered 2018-09-11: 1000 mL via INTRAVENOUS

## 2018-09-11 MED ORDER — ACETAMINOPHEN 160 MG/5ML PO SOLN: 650.0000 mg | ORAL | Status: DC | PRN

## 2018-09-11 NOTE — H&P (Addendum)
Date: 09/11/2018               Patient Name:  Arthur [name] MRN: [id number]  DOB: 02-Apr-1956 Age / Sex: 62 y.o., male   PCP: Beola Cord, MD         Medical Service: Internal Medicine Teaching Service         Attending Physician: Dr. Gerhard Munch, MD    First Contact: Dr. Julian Hy Pager: [phone]  Second Contact: Dr. Levora Dredge Pager: [phone]       After Hours (After 5p/  First Contact Pager: [phone]  weekends / holidays): Second Contact Pager: [phone]   Chief Complaint: Global aphasia  History of Present Illness: Arthur [name] is a 62 year old male with HIV, alcohol abuse, HTN, bipolar disease, atrial fibrillation on chronic Xarelot, and history of TIA who presented expressive/receptive aphasia and intermittent confusion.  Patient is unable to provide any history due to altered mental status.  Per ED note, the patient was found at a bank this morning where he was unabe to communicate effectively with the bank teller. Bank staff became concerned and called EMS. Patient was found to have A.fib with RVR with normal vital signs. He was transferred to [facility] for further evaluation.   In the ED, code stroke was called. He was found to have an acute/subacute left ACA territory infarct involving the left lentiform nucleus, left superior temporal gyrus, left postcentral gyrus, and left basal ganglia. There also appears to be scattered white matter disease is worse on the left. CTA of head and neck were obtained and did not show signs of large vessel occlusion. TPA was not given due to unknown last normal.  Meds:  No current facility-administered medications on file prior to encounter.    Current Outpatient Medications on File Prior to Encounter  Medication Sig Dispense Refill  . ARIPiprazole (ABILIFY) 15 MG tablet Take 15 mg by mouth daily.    [name] BIKTARVY 50-200-25 MG TABS tablet TAKE 1 TABLET BY MOUTH DAILY (Patient taking differently: Take 1 tablet by mouth  daily. ) 30 tablet 0  . Cholecalciferol (VITAMIN D) 2000 units CAPS Take 2,000 Units by mouth daily.     . clonazePAM (KLONOPIN) 0.5 MG tablet Take 0.5 mg by mouth 3 times/day as needed-between meals & bedtime.     . Cyanocobalamin (VITAMIN B 12 PO) Take 1,000 mg by mouth every other day.    . docusate sodium (COLACE) 100 MG capsule Take 1 capsule (100 mg total) by mouth daily. (May purchase from over the counter at the pharmacy): For constipation (Patient not taking: Reported on 06/25/2018) 1 capsule 0  . DULoxetine (CYMBALTA) 60 MG capsule TAKE 2 CAPSULES (120 MG) BY MOUTH DAILY (Patient taking differently: Take 120 mg by mouth daily. ) 60 capsule 3  . ferrous sulfate 325 (65 FE) MG EC tablet Take 325 mg by mouth daily.    . hydrocortisone cream 1 % Apply topically 2 (two) times daily as needed for itching. 30 g 0  . hydrOXYzine (ATARAX/VISTARIL) 50 MG tablet Take 50 mg by mouth at bedtime.    . mirtazapine (REMERON) 15 MG tablet TAKE 1 TABLET(15 MG) BY MOUTH AT BEDTIME FOR DEPRESSION OR SLEEP (Patient taking differently: Take 15 mg by mouth at bedtime. ) 30 tablet 5  . permethrin (ELIMITE) 5 % cream Apply to skin from head to soles of feet once. Leave on for 8 to 14 hours then rinse off. 60 g 0  .  prazosin (MINIPRESS) 2 MG capsule Take 2 mg by mouth at bedtime.    . rivaroxaban (XARELTO) 20 MG TABS tablet Take 1 tablet (20 mg total) by mouth daily with supper. [facility] 30 tablet 11  . rosuvastatin (CRESTOR) 10 MG tablet Take 1 tablet (10 mg total) by mouth at bedtime. For high cholesterol 30 tablet 0  . sildenafil (VIAGRA) 100 MG tablet Take 1 tablet (100 mg total) by mouth daily as needed for erectile dysfunction. 30 tablet 5  . sotalol (BETAPACE) 80 MG tablet Take 1 tablet (80 mg total) by mouth every 12 (twelve) hours. 60 tablet 5  . tamsulosin (FLOMAX) 0.4 MG CAPS capsule Take 1 capsule (0.4 mg total) by mouth 2 (two) times daily. For prostate health 60 capsule 11  . topiramate (TOPAMAX)  50 MG tablet Take 50 mg by mouth at bedtime.     . triamcinolone cream (KENALOG) 0.1 % Apply 1 application topically 2 (two) times daily. 30 g 0    Allergies: Allergies as of 09/11/2018  . (No Known Allergies)   Past Medical History:  Diagnosis Date  . Alcohol abuse 12/21/2016  . Atrial fibrillation (HCC)   . Bipolar disorder (HCC)   . Depression   . History of blood transfusion 12-12-1998  . History of radiation exposure 08/17/2017  . HIV (human immunodeficiency virus infection) (HCC)   . HTN (hypertension) 11/17/2016  . 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 Late syphilis   . Lower back pain 11/25/2015  . 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 Syncope due to orthostatic hypotension 10/04/2017  . TIA (transient ischemic attack) 11/17/2016    Family History: Unable to obtain due to altered mental status  Social History: Unable to obtain due to altered mental status  Review of Systems: Unable to obtain due to altered mental status  Physical Exam: Blood pressure 103/73, pulse 81, temperature 99.1 F (37.3 C), temperature source Rectal, resp. rate (!) 25, SpO2 100 %. Physical Exam  Constitutional: He appears well-developed and well-nourished.  HENT:  Head: Normocephalic and atraumatic.  Eyes: Conjunctivae and EOM are normal. Right eye exhibits no discharge. Left eye exhibits no discharge.  Neck: Normal range of motion. Neck supple.  Cardiovascular: Normal rate and normal heart sounds.  Irregular rhythm  Pulmonary/Chest: Effort normal and breath sounds normal.  Abdominal: Soft. Bowel sounds are normal.  Musculoskeletal:  No LE edema noted bilaterally  Neurological:  He has global aphasia (receptive and expressive aphasia) and is unable to follow verbal or visual commands.   Skin: Skin is warm and dry.  Psychiatric:  He has global aphasia (receptive and expressive aphasia) and it is difficult to determine his mood.  Vitals  reviewed.   EKG: personally reviewed my interpretation is atrial fibrillation with several pre-mature ventricular complexes noted.   Assessment & Plan by Problem: Principal Problem:   CVA (cerebral vascular accident) (HCC) Active Problems:   Persistent atrial fibrillation   Acute kidney injury (HCC)   Lactic acidosis   Global aphasia   Bipolar 1 disorder (HCC)   Alcoholism (HCC)   Essential hypertension  Mr. Fidalgo is a 62 year old male with known atrial fibrillation who presented with global aphasia and found to have left ACA/MCA territory infarct with bilateral white matter changes. He is not a TPA candidate due to no known last normal and not an IR candidate due to no large vessel occlusion seen on CTA. There is no clear etiology for his CVA (thrombotic vs embolic)  but he has risk factors for both (HTN and atrial fibrillation on Xarelto unable to verify compliance).  Left ACA/MCA Infarct 1. Appreciate neurology consult  2. Ordered brain MRI 3. OrderedTransthoracic Echo 3. Awaiting swallow screen; Mr. Weissinger will remain NPO until he passes his swallow screen 4. Start  ASA 300 mg rectal; if he passes swallow screen will switch to ASA 325 mg PO 5. Allow for permissive HTN up to 220/120 mmHg 6. Ordered A1c and Lipid panel to be collected tomorrow with routine labs 7. Frequent neuro checks   Acute Kidney Injury: Creatinine 1.85 from baseline of 1.04 eight months ago. Differential diagnosis includes intra-renal causes such as intrinsic renal vascular disease , intrinsic tubular and interstitial disease and post-renal causes such as bladder obstruction. 1. Ordered renal ultrasound  2. Ordered bladder scan 3. S/p 1 L IVF; Continue maintenance IVF's  Lactic Acidosis: Lactic acidosis of 6.88 of unclear etiology. Differential diagnosis includes Type A (caused by CVA) and Type B (Biktarvy). Per ID, the active anti-virals contained in [place] have a very risk of lactic acidosis and  recommends continuing HIV medication at this time. Lactic acidosis is less likely due to sepsis (no systemic symptoms including fever) and alcohol use (Alcohol level today unremarkable). Finally, he was found to have an acute kidney injury. 1. S/p 1 L IVF; Continue maintenance IVF's 2. Trend lactic acid levels  History of Alcoholism: Alcohol level today <10 - Clinical CIWA protocol; No Ativan ordered.   Persistent A-fib with RVR: He is supposed to be on Xarelto as outpatient but unsure if he is taking it. He spontaneously converted in the ED. 1. Continue to monitor  Dispo: Admit patient to Inpatient with expected length of stay greater than 2 midnights.   Signed: Synetta Shadow, MD 09/11/2018, 3:55 PM  Pager: [phone]

## 2018-09-11 NOTE — Consult Note (Addendum)
Neurology Consultation  Reason for Consult: Code stroke with no known Last normal Referring Physician: Jeraldine Loots  CC: Globally aphasia  History is obtained from: EDP  HPI: Arthur [name] is a 62 y.o. male with history of TIA, hypertension, hypertension, bipolar disease, and atrial fibrillation on chronic Coumadin along with alcohol abuse.  Patient apparently drove [place] the bank where he was noticed to have both expressive and receptive aphasia.  EMS was called and patient was brought to [facility].  At that time and currently there is known last normal.  Patient while in the hospital is globally aphasic, with right facial droop.  Code stroke CT was obtained showing acute/subacute left ACA territory infarcts involving the left Lentiform nucleus, left superior temporal gyrus, left postcentral gyrus and left basal ganglia.  There also appears to be scattered white matter disease worse on the right.  CTA of head and neck were obtained showing no large vessel occlusion.  As there is no large vessel occlusion patient was not a CTA candidate.  It should be noted that his INR is subtherapeutic at 1.12.  LKW: Unknown tpa given?: no, due to known last normal Premorbid modified Rankin scale (mRS): 0 NIHSS difficult to ascertain as patient is mute however I did obtain a 13   ROS:  Unable to obtain due to altered mental status.   Past Medical History:  Diagnosis Date  . Alcohol abuse 12/21/2016  . Atrial fibrillation (HCC)   . Bipolar disorder (HCC)   . Depression   . History of blood transfusion 12-12-1998  . History of radiation exposure 08/17/2017  . HTN (hypertension) 11/17/2016  . 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 Late syphilis   . Lower back pain 11/25/2015  . 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 Syncope due to orthostatic hypotension 10/04/2017  . TIA (transient ischemic attack) 11/17/2016    Family History  Problem Relation Age of Onset  .  Heart disease Mother 64       MI age 71.  Died age 50  . Stroke Mother   . Heart disease Father        Died age 26s MI  . Heart disease Sister        No details     Social History:   reports that he has never smoked. He has never used smokeless tobacco. He reports that he does not drink alcohol or use drugs.  Medications  Current Facility-Administered Medications:  .  [COMPLETED] sodium chloride 0.9 % bolus 1,000 mL, 1,000 mL, Intravenous, Once, Stopped at 09/11/18 1247 **AND** 0.9 %  sodium chloride infusion, , Intravenous, Continuous, Gerhard Munch, MD, Last Rate: 125 mL/hr at 09/11/18 1253, 1,000 mL at 09/11/18 1253  Current Outpatient Medications:  .  ARIPiprazole (ABILIFY) 15 MG tablet, Take 15 mg by mouth daily., Disp: , Rfl:  .  BIKTARVY 50-200-25 MG TABS tablet, TAKE 1 TABLET BY MOUTH DAILY (Patient taking differently: Take 1 tablet by mouth daily. ), Disp: 30 tablet, Rfl: 0 .  Cholecalciferol (VITAMIN D) 2000 units CAPS, Take 2,000 Units by mouth daily. , Disp: , Rfl:  .  clonazePAM (KLONOPIN) 0.5 MG tablet, Take 0.5 mg by mouth 3 times/day as needed-between meals & bedtime. , Disp: , Rfl:  .  Cyanocobalamin (VITAMIN B 12 PO), Take 1,000 mg by mouth every other day., Disp: , Rfl:  .  docusate sodium (COLACE) 100 MG capsule, Take 1 capsule (100 mg total) by mouth daily. (May purchase from over  the counter at the pharmacy): For constipation (Patient not taking: Reported on 06/25/2018), Disp: 1 capsule, Rfl: 0 .  DULoxetine (CYMBALTA) 60 MG capsule, TAKE 2 CAPSULES (120 MG) BY MOUTH DAILY (Patient taking differently: Take 120 mg by mouth daily. ), Disp: 60 capsule, Rfl: 3 .  ferrous sulfate 325 (65 FE) MG EC tablet, Take 325 mg by mouth daily., Disp: , Rfl:  .  hydrocortisone cream 1 %, Apply topically 2 (two) times daily as needed for itching., Disp: 30 g, Rfl: 0 .  hydrOXYzine (ATARAX/VISTARIL) 50 MG tablet, Take 50 mg by mouth at bedtime., Disp: , Rfl:  .  mirtazapine  (REMERON) 15 MG tablet, TAKE 1 TABLET(15 MG) BY MOUTH AT BEDTIME FOR DEPRESSION OR SLEEP (Patient taking differently: Take 15 mg by mouth at bedtime. ), Disp: 30 tablet, Rfl: 5 .  permethrin (ELIMITE) 5 % cream, Apply to skin from head to soles of feet once. Leave on for 8 to 14 hours then rinse off., Disp: 60 g, Rfl: 0 .  prazosin (MINIPRESS) 2 MG capsule, Take 2 mg by mouth at bedtime., Disp: , Rfl:  .  rivaroxaban (XARELTO) 20 MG TABS tablet, Take 1 tablet (20 mg total) by mouth daily with supper. [facility], Disp: 30 tablet, Rfl: 11 .  rosuvastatin (CRESTOR) 10 MG tablet, Take 1 tablet (10 mg total) by mouth at bedtime. For high cholesterol, Disp: 30 tablet, Rfl: 0 .  sildenafil (VIAGRA) 100 MG tablet, Take 1 tablet (100 mg total) by mouth daily as needed for erectile dysfunction., Disp: 30 tablet, Rfl: 5 .  sotalol (BETAPACE) 80 MG tablet, Take 1 tablet (80 mg total) by mouth every 12 (twelve) hours., Disp: 60 tablet, Rfl: 5 .  tamsulosin (FLOMAX) 0.4 MG CAPS capsule, Take 1 capsule (0.4 mg total) by mouth 2 (two) times daily. For prostate health, Disp: 60 capsule, Rfl: 11 .  topiramate (TOPAMAX) 50 MG tablet, Take 50 mg by mouth at bedtime. , Disp: , Rfl:  .  triamcinolone cream (KENALOG) 0.1 %, Apply 1 application topically 2 (two) times daily., Disp: 30 g, Rfl: 0   Exam: Current vital signs: BP (!) 214/130 (BP Location: Right Arm)   Pulse (!) 171   Temp 99.1 F (37.3 C) (Rectal)   Resp (!) 24   SpO2 100%  Vital signs in last 24 hours: Temp:  [96.6 F (35.9 C)-99.1 F (37.3 C)] 99.1 F (37.3 C) (10/01 1213) Pulse Rate:  [147-171] 171 (10/01 1213) Resp:  [24] 24 (10/01 1207) BP: (214)/(130) 214/130 (10/01 1213) SpO2:  [100 %] 100 % (10/01 1207)   Physical Exam  Constitutional: Appears well-developed and well-nourished.  Psych: Affect appropriate to situation Eyes: No scleral injection HENT: No OP obstrucion Head: Normocephalic.  Cardiovascular: Normal rate and regular  rhythm.  Respiratory: Effort normal, non-labored breathing GI: Soft.  No distension. There is no tenderness.  Skin: WDI  NEURO:  Mental Status: Patient is alert, at times can follow visual commands but has very difficult time following any vocal commands as he is globally mute. Cranial Nerves: PERRL 2 mm/brisk. EOMI, visual fields full, right facial droop facial sensation intact, hearing intact, Motor: Moving all extremities antigravity however he is able to hold his arms up antigravity for 10 seconds, he is only able to hold bilateral legs up for a few seconds before they drop back to the bed.  This likely is secondary to him not understanding Tone: is normal and bulk is normal Sensation- Intact to light touch  bilaterally--withdraws from noxious stimuli Coordination: Unable to examine  gait- deferred    Labs I have reviewed labs in epic and the results pertinent to this consultation are:   CBC    Component Value Date/Time   WBC 8.8 09/11/2018 1218   RBC 5.00 09/11/2018 1218   HGB 17.0 09/11/2018 1249   HCT 50.0 09/11/2018 1249   PLT 238 09/11/2018 1218   MCV 97.4 09/11/2018 1218   MCH 31.8 09/11/2018 1218   MCHC 32.6 09/11/2018 1218   RDW 11.7 09/11/2018 1218   LYMPHSABS 2.0 09/11/2018 1218   MONOABS 0.7 09/11/2018 1218   EOSABS 0.0 09/11/2018 1218   BASOSABS 0.0 09/11/2018 1218    CMP     Component Value Date/Time   NA 135 09/11/2018 1249   K 4.4 09/11/2018 1249   CL 101 09/11/2018 1249   CO2 19 (L) 09/11/2018 1218   GLUCOSE 144 (H) 09/11/2018 1249   BUN 23 09/11/2018 1249   CREATININE 1.70 (H) 09/11/2018 1249   CREATININE 1.00 12/07/2017 1532   CALCIUM 9.2 09/11/2018 1218   PROT 6.7 09/11/2018 1218   ALBUMIN 3.7 09/11/2018 1218   AST 24 09/11/2018 1218   ALT 11 09/11/2018 1218   ALKPHOS 59 09/11/2018 1218   BILITOT 1.2 09/11/2018 1218   GFRNONAA 37 (L) 09/11/2018 1218   GFRNONAA 80 12/07/2017 1532   GFRAA 43 (L) 09/11/2018 1218   GFRAA 93 12/07/2017 1532     Lipid Panel     Component Value Date/Time   CHOL 156 06/05/2018 0805   TRIG 70 06/05/2018 0805   HDL 67 06/05/2018 0805   CHOLHDL 2.3 06/05/2018 0805   VLDL 10 05/25/2016 0927   LDLCALC 74 06/05/2018 0805     Imaging I have reviewed the images obtained:  CT-scan of the brain-- IMPRESSION: 1. Acute/subacute left ACA territory infarcts involving the left lentiform nucleus, left superior temporal gyrus, left postcentral gyrus, and left basal ganglia. 2. Scattered white matter disease is worse left than right. 3. Left mastoid effusion. No obstructing nasopharyngeal lesion is present. 4. ASPECTS is 6/10  CTA of head and neck-- IMPRESSION: 1. Discordant ASPECTS and CT Perfusion findings suggesting a subacute left MCA infarct with pseudo-normalized perfusion. 2. Mild irregularity of left MCA M2 branches, most apparent on series 8 image 28, with No left MCA branch or large vessel occlusion identified. 3. Mild for age calcified carotid atherosclerosis in the head and neck. Mild posterior circulation atherosclerosis. No atherosclerotic stenosis identified. 4. Incidental left subclavian venous stenosis   CT perfusion- ASPECTS was 6 out of 10 at 1337 hours. No core infarct is detected by CBF (<30%) Volume: (0 milliliters)  Perfusion (Tmax>6.0s) volume: 28 milliliters but with bilateral representation which is likely erroneously. M TT and T-max maps do indicate asymmetric abnormal perfusion parameters in the left MCA territory.  Mismatch Volume: Not applicable  MRI examination of the brain--- pending  Felicie Morn PA-C [facility] [phone]  M-F  (9:00 am- 5:00 PM)  09/11/2018, 1:55 PM   Have seen the patient reviewed the above note.  Assessment: 62 year old male with known atrial fibrillation and subtherapeutic INR presenting globally mute.  CTA does not show any large vessel occlusion however cerebral perfusion does show bilateral cerebral  infarcts.  Unfortunately he was not a TPA candidate as there was no known last normal and he was not an interventional radiology candidate as he had no large vessel occlusion.  He will need to be admitted for therapy and risk factor  modification.  Given the size of infarct seen on CT, I would favor holding off on anticoagulation for the current time.  I suspect that the CTP is erroneous.  Recommend # MRI of the brain without contrast  #Transthoracic Echo,  #start ASA 325 oral if passes swallow screen 300 mg rectal if not  #Start or continue Atorvastatin 80 mg/other high intensity statin # BP goal: permissive HTN upto 220/120 mmHg # HBAIC and Lipid profile # Telemetry monitoring # Frequent neuro checks # NPO until passes stroke swallow screen # please page stroke NP  Or  PA  Or MD from 8am -4 pm  as this patient from this time will be  followed by the stroke.   You can look them up on [email or web address]  Password [email or web address]  Ritta Slot, MD [facility] [phone]  If 7pm- 7am, please page neurology on call as listed in AMION.

## 2018-09-11 NOTE — ED Notes (Signed)
Attempted blood draw x2 no return, just flash. RN aware

## 2018-09-11 NOTE — ED Notes (Signed)
Attempted in and out cath, no urine produced. Will attempt later.

## 2018-09-11 NOTE — Consult Note (Addendum)
[facility] for Infectious Disease    Date of Admission:  09/11/2018   Total days of antibiotics: 0               Reason for Consult: HIV, acidosis, CVA    Referring Provider: Helberg   Assessment: HIV+ CVA Acidosis, AKI  Plan: 1. Would continue biktarvy. His HIV is well controlled. Will recheck his labs.  2. Appreciate neuro eval. He was not a candidate for TPA therapy.  3.  could consider renal eval. Alternatively, would watch and see how he does with hydration.  4. Will follow with you 5. My great appreciation to the outstanding care of IMTS and neuro.   Comment- Biktarvy is composed of dolutegravir (an integrase inhibitor which is hepatically metabolized), tenofovir alafenamide (a nucleotide reverse transcriptase inhibitor, which does not have the previous renal toxicity of it's precursor drug tenofovir desprodoxil) and emtricitabine (a nucleoside reverse transcriptase inhibitor, which is oxidized, conjugated then excreted in the urine unchanged and is not renally toxic).   hiv patients are at increased of CV disease (on top of his PAF).   Thank you so much for this interesting consult,  HIV 1 RNA Quant (copies/mL)  Date Value  06/05/2018 <20 DETECTED (A)  12/07/2017 <20 DETECTED (A)  06/08/2017 <20 NOT DETECTED   HIV-1 RNA Viral Load (no units)  Date Value  10/19/2015 <40  08/31/2015 <40  07/07/2015 <40   CD4 (no units)  Date Value  10/19/2015 1,056  06/18/2015 744  05/12/2015 880   CD4 T Cell Abs (/uL)  Date Value  06/05/2018 1,020  12/07/2017 870  11/09/2016 1,090     Active Problems:   CVA (cerebral vascular accident) ([place])   .  stroke: mapping our early stages of recovery book   Does not apply Once  . ARIPiprazole  15 mg Oral Daily  . aspirin  300 mg Rectal Daily   Or  . aspirin  325 mg Oral Daily  . DULoxetine  120 mg Oral Daily  . enoxaparin (LOVENOX) injection  40 mg Subcutaneous Q24H  . mirtazapine  15 mg Oral QHS  .  rosuvastatin  20 mg Oral q1800  . senna-docusate  1 tablet Oral BID  . tamsulosin  0.4 mg Oral BID    HPI: Arthur [name] is a 62 y.o. 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 Brought to ED today after driving to bank and noted to have expressive and receptive aphasia. He was noted in ED to have R facial droop. He had a CT showing a L ACA territory infarcts.  He was also noted to have a INR of 1.12. Prev on xarelto.   Review of Systems: Review of Systems  Unable to perform ROS: Acuity of condition    Past Medical History:  Diagnosis Date  . Alcohol abuse 12/21/2016  . Atrial fibrillation ([place])   . Bipolar disorder ([place])   . CVA (cerebral vascular accident) ([place]) 09/11/2018  . Depression   . History of blood transfusion 12-12-1998  . History of radiation exposure 08/17/2017  . HIV (human immunodeficiency virus infection) ([place])   . HTN (hypertension) 11/17/2016  . 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 Late syphilis   . Lower back pain 11/25/2015  . Recurrent major depression-severe ([place]) 06/20/2016  . Syncope due to orthostatic hypotension 10/04/2017  . TIA (transient ischemic attack) 11/17/2016  Social History   Tobacco Use  . Smoking status: Never Smoker  . Smokeless tobacco: Never Used  Substance Use Topics  . Alcohol use: No    Comment: no EtOH in 20 days 09/26/17  . Drug use: No    Family History  Problem Relation Age of Onset  . Heart disease Mother 33       MI age 14.  Died age 63  . Stroke Mother   . Heart disease Father        Died age 86s MI  . Heart disease Sister        No details     Medications:  Scheduled: .  stroke: mapping our early stages of recovery book   Does not apply Once  . ARIPiprazole  15 mg Oral Daily  . aspirin  300 mg Rectal Daily   Or  . aspirin  325 mg Oral Daily  . DULoxetine  120 mg Oral Daily  . enoxaparin (LOVENOX) injection  40  mg Subcutaneous Q24H  . mirtazapine  15 mg Oral QHS  . rosuvastatin  20 mg Oral q1800  . senna-docusate  1 tablet Oral BID  . tamsulosin  0.4 mg Oral BID    Abtx:  Anti-infectives (From admission, onward)   None        OBJECTIVE: Blood pressure 103/80, pulse 73, temperature 99.1 F (37.3 C), temperature source Rectal, resp. rate (!) 25, SpO2 100 %.  Physical Exam  Constitutional: He appears well-developed and well-nourished. No distress.  HENT:  Mouth/Throat: No oropharyngeal exudate.  Eyes: Pupils are equal, round, and reactive to light. EOM are normal.  Neck: Normal range of motion. Neck supple.  Cardiovascular: An irregularly irregular rhythm present.  Pulmonary/Chest: Effort normal and breath sounds normal.  Abdominal: Soft. Bowel sounds are normal. He exhibits no distension. There is no tenderness.  Neurological:  He has normal grip and plantar strength.  He follows commands.  He does not speak in clear words.     Lab Results Results for orders placed or performed during the hospital encounter of 09/11/18 (from the past 48 hour(s))  Comprehensive metabolic panel     Status: Abnormal   Collection Time: 09/11/18 12:18 PM  Result Value Ref Range   Sodium 136 135 - 145 mmol/L   Potassium 4.2 3.5 - 5.1 mmol/L   Chloride 100 98 - 111 mmol/L   CO2 19 (L) 22 - 32 mmol/L   Glucose, Bld 149 (H) 70 - 99 mg/dL   BUN 18 8 - 23 mg/dL   Creatinine, Ser 1.85 (H) 0.61 - 1.24 mg/dL   Calcium 9.2 8.9 - 10.3 mg/dL   Total Protein 6.7 6.5 - 8.1 g/dL   Albumin 3.7 3.5 - 5.0 g/dL   AST 24 15 - 41 U/L   ALT 11 0 - 44 U/L   Alkaline Phosphatase 59 38 - 126 U/L   Total Bilirubin 1.2 0.3 - 1.2 mg/dL   GFR calc non Af Amer 37 (L) >60 mL/min   GFR calc Af Amer 43 (L) >60 mL/min    Comment: (NOTE) The eGFR has been calculated using the CKD EPI equation. This calculation has not been validated in all clinical situations. eGFR's persistently <60 mL/min signify possible Chronic  Kidney Disease.    Anion gap 17 (H) 5 - 15    Comment: Performed at [facility], [place] [address], [place], [place] [address]  CBC WITH DIFFERENTIAL     Status: None  Collection Time: 09/11/18 12:18 PM  Result Value Ref Range   WBC 8.8 4.0 - 10.5 K/uL   RBC 5.00 4.22 - 5.81 MIL/uL   Hemoglobin 15.9 13.0 - 17.0 g/dL   HCT 48.7 39.0 - 52.0 %   MCV 97.4 78.0 - 100.0 fL   MCH 31.8 26.0 - 34.0 pg   MCHC 32.6 30.0 - 36.0 g/dL   RDW 11.7 11.5 - 15.5 %   Platelets 238 150 - 400 K/uL   Neutrophils Relative % 67 %   Neutro Abs 5.9 1.7 - 7.7 K/uL   Lymphocytes Relative 23 %   Lymphs Abs 2.0 0.7 - 4.0 K/uL   Monocytes Relative 8 %   Monocytes Absolute 0.7 0.1 - 1.0 K/uL   Eosinophils Relative 1 %   Eosinophils Absolute 0.0 0.0 - 0.7 K/uL   Basophils Relative 0 %   Basophils Absolute 0.0 0.0 - 0.1 K/uL   Immature Granulocytes 1 %   Abs Immature Granulocytes 0.1 0.0 - 0.1 K/uL    Comment: Performed at [facility], [address], [place], [place] [address]  Ethanol     Status: None   Collection Time: 09/11/18 12:18 PM  Result Value Ref Range   Alcohol, Ethyl (B) <10 <10 mg/dL    Comment: (NOTE) Lowest detectable limit for serum alcohol is 10 mg/dL. For medical purposes only. Performed at [facility], [place] [address], [place], [place] [address]   I-stat troponin, ED     Status: None   Collection Time: 09/11/18 12:47 PM  Result Value Ref Range   Troponin i, poc 0.00 0.00 - 0.08 ng/mL   Comment 3            Comment: Due to the release kinetics of cTnI, a negative result within the first hours of the onset of symptoms does not rule out myocardial infarction with certainty. If myocardial infarction is still suspected, repeat the test at appropriate intervals.   I-Stat CG4 Lactic Acid, ED  (not at [facility])     Status: Abnormal   Collection Time: 09/11/18 12:49 PM  Result Value Ref Range   Lactic Acid, Venous 6.88 (HH) 0.5 - 1.9 mmol/L   Comment NOTIFIED PHYSICIAN    I-Stat Chem 8, ED     Status: Abnormal   Collection Time: 09/11/18 12:49 PM  Result Value Ref Range   Sodium 135 135 - 145 mmol/L   Potassium 4.4 3.5 - 5.1 mmol/L   Chloride 101 98 - 111 mmol/L   BUN 23 8 - 23 mg/dL   Creatinine, Ser 1.70 (H) 0.61 - 1.24 mg/dL   Glucose, Bld 144 (H) 70 - 99 mg/dL   Calcium, Ion 1.05 (L) 1.15 - 1.40 mmol/L   TCO2 20 (L) 22 - 32 mmol/L   Hemoglobin 17.0 13.0 - 17.0 g/dL   HCT 50.0 39.0 - 52.0 %  TSH     Status: None   Collection Time: 09/11/18  1:03 PM  Result Value Ref Range   TSH 1.731 0.350 - 4.500 uIU/mL    Comment: Performed by a 3rd Generation assay with a functional sensitivity of <=0.01 uIU/mL. Performed at [facility], [place] [address], [place],  [address]   Ammonia     Status: None   Collection Time: 09/11/18  1:03 PM  Result Value Ref Range   Ammonia 26 9 - 35 umol/L    Comment: Performed at [facility], [place] [address] [place], [place]  [place]     Status: None   Collection Time: 09/11/18  1:05 PM  Result Value Ref Range   Prothrombin Time 14.3 11.4 - 15.2 seconds   INR 1.12     Comment: Performed at [facility] [address], [place], [place] [address]  APTT     Status: None   Collection Time: 09/11/18  1:05 PM  Result Value Ref Range   aPTT 33 24 - 36 seconds    Comment: Performed at [facility] [address], [place],  [address]   No results found for: SDES, SPECREQUEST, CULT, REPTSTATUS Ct Angio Head W Or Wo Contrast  Result Date: 09/11/2018 CLINICAL DATA:  62 year old male with aphasia and left MCA territory infarct changes on noncontrast head CT today. EXAM: CT ANGIOGRAPHY HEAD AND NECK CT PERFUSION BRAIN TECHNIQUE: Multidetector CT imaging of the head and neck was performed using the standard protocol during bolus administration of intravenous contrast. Multiplanar CT image reconstructions and MIPs were obtained to evaluate the vascular anatomy. Carotid stenosis  measurements (when applicable) are obtained utilizing NASCET criteria, using the distal internal carotid diameter as the denominator. Multiphase CT imaging of the brain was performed following IV bolus contrast injection. Subsequent parametric perfusion maps were calculated using RAPID software. CONTRAST:  172m ISOVUE-370 IOPAMIDOL (ISOVUE-370) INJECTION 76% COMPARISON:  Head CT 1337 hours. Brain MRI and intracranial MRA 05/31/2016. FINDINGS: CT Brain Perfusion Findings: ASPECTS was 6 out of 10 at 1337 hours. No core infarct is detected by CBF (<30%) Volume: (0 milliliters) Perfusion (Tmax>6.0s) volume: 28 milliliters but with bilateral representation which is likely erroneously. M TT and T-max maps do indicate asymmetric abnormal perfusion parameters in the left MCA territory. Mismatch Volume: Not applicable CTA NECK Skeleton: No acute osseous abnormality identified. Cervical spine degeneration. Upper chest: Negative lung apices and superior mediastinum. Other neck: Contrast reflux in the paravertebral venous system due to left medial subclavian vein stenosis. Otherwise negative. Aortic arch: 3 vessel arch configuration. No arch atherosclerosis or great vessel origin stenosis. Right carotid system: Mild calcified plaque at the right ICA bulb, no stenosis. Left carotid system: Mild calcified plaque at the medial left ICA bulb without stenosis. Vertebral arteries: No proximal subclavian artery or vertebral artery origin atherosclerosis or stenosis. Mildly dominant left vertebral artery. V2 segment tortuosity. No vertebral artery plaque or stenosis to the skull base. CTA HEAD Posterior circulation: Mild distal vertebral artery irregularity without stenosis. Patent PICA origins and vertebrobasilar junction. Mild basilar artery irregularity without stenosis. Patent SCA and PCA origins. Small left posterior communicating artery, the right is diminutive or absent. Minimal PCA branch irregularity without stenosis.  Anterior circulation: Both ICA siphons are patent. Mild left siphon calcified plaque without stenosis. On the right there is up to moderate calcified plaque maximal at the supraclinoid segment, but no convincing stenosis. Normal ophthalmic and left posterior communicating artery origins. Patent carotid termini. Normal MCA and ACA origins. Anterior communicating artery and bilateral ACA branches are within normal limits. Right MCA M1 segment, right MCA bifurcation, and right MCA branches are within normal limits. The left MCA M1 segment bifurcates early. No M1 or bifurcation irregularity or stenosis. There is mild irregularity in the left MCA M2 branches most apparent on series 8, image 28. But when compared to the 2017 intracranial MRA, there is no left MCA branch occlusion identified. Venous sinuses: Not evaluated due to early contrast timing. Anatomic variants: Mildly dominant left vertebral artery. Review of the MIP images confirms  the above findings IMPRESSION: 1. Discordant ASPECTS and CT Perfusion findings suggesting a subacute left MCA infarct with pseudo-normalized perfusion. 2. Mild irregularity of left MCA M2 branches, most apparent on series 8 image 28, with No left MCA branch or large vessel occlusion identified. 3. Mild for age calcified carotid atherosclerosis in the head and neck. Mild posterior circulation atherosclerosis. No atherosclerotic stenosis identified. 4. Incidental left subclavian venous stenosis. Electronically Signed   By: Genevie Ann M.D.   On: 09/11/2018 14:10   Ct Angio Neck W Or Wo Contrast  Result Date: 09/11/2018 CLINICAL DATA:  62 year old male with aphasia and left MCA territory infarct changes on noncontrast head CT today. EXAM: CT ANGIOGRAPHY HEAD AND NECK CT PERFUSION BRAIN TECHNIQUE: Multidetector CT imaging of the head and neck was performed using the standard protocol during bolus administration of intravenous contrast. Multiplanar CT image reconstructions and MIPs were  obtained to evaluate the vascular anatomy. Carotid stenosis measurements (when applicable) are obtained utilizing NASCET criteria, using the distal internal carotid diameter as the denominator. Multiphase CT imaging of the brain was performed following IV bolus contrast injection. Subsequent parametric perfusion maps were calculated using RAPID software. CONTRAST:  17m ISOVUE-370 IOPAMIDOL (ISOVUE-370) INJECTION 76% COMPARISON:  Head CT 1337 hours. Brain MRI and intracranial MRA 05/31/2016. FINDINGS: CT Brain Perfusion Findings: ASPECTS was 6 out of 10 at 1337 hours. No core infarct is detected by CBF (<30%) Volume: (0 milliliters) Perfusion (Tmax>6.0s) volume: 28 milliliters but with bilateral representation which is likely erroneously. M TT and T-max maps do indicate asymmetric abnormal perfusion parameters in the left MCA territory. Mismatch Volume: Not applicable CTA NECK Skeleton: No acute osseous abnormality identified. Cervical spine degeneration. Upper chest: Negative lung apices and superior mediastinum. Other neck: Contrast reflux in the paravertebral venous system due to left medial subclavian vein stenosis. Otherwise negative. Aortic arch: 3 vessel arch configuration. No arch atherosclerosis or great vessel origin stenosis. Right carotid system: Mild calcified plaque at the right ICA bulb, no stenosis. Left carotid system: Mild calcified plaque at the medial left ICA bulb without stenosis. Vertebral arteries: No proximal subclavian artery or vertebral artery origin atherosclerosis or stenosis. Mildly dominant left vertebral artery. V2 segment tortuosity. No vertebral artery plaque or stenosis to the skull base. CTA HEAD Posterior circulation: Mild distal vertebral artery irregularity without stenosis. Patent PICA origins and vertebrobasilar junction. Mild basilar artery irregularity without stenosis. Patent SCA and PCA origins. Small left posterior communicating artery, the right is diminutive or  absent. Minimal PCA branch irregularity without stenosis. Anterior circulation: Both ICA siphons are patent. Mild left siphon calcified plaque without stenosis. On the right there is up to moderate calcified plaque maximal at the supraclinoid segment, but no convincing stenosis. Normal ophthalmic and left posterior communicating artery origins. Patent carotid termini. Normal MCA and ACA origins. Anterior communicating artery and bilateral ACA branches are within normal limits. Right MCA M1 segment, right MCA bifurcation, and right MCA branches are within normal limits. The left MCA M1 segment bifurcates early. No M1 or bifurcation irregularity or stenosis. There is mild irregularity in the left MCA M2 branches most apparent on series 8, image 28. But when compared to the 2017 intracranial MRA, there is no left MCA branch occlusion identified. Venous sinuses: Not evaluated due to early contrast timing. Anatomic variants: Mildly dominant left vertebral artery. Review of the MIP images confirms the above findings IMPRESSION: 1. Discordant ASPECTS and CT Perfusion findings suggesting a subacute left MCA infarct with pseudo-normalized perfusion. 2. Mild  irregularity of left MCA M2 branches, most apparent on series 8 image 28, with No left MCA branch or large vessel occlusion identified. 3. Mild for age calcified carotid atherosclerosis in the head and neck. Mild posterior circulation atherosclerosis. No atherosclerotic stenosis identified. 4. Incidental left subclavian venous stenosis. Electronically Signed   By: Genevie Ann M.D.   On: 09/11/2018 14:10   Ct Cerebral Perfusion W Contrast  Result Date: 09/11/2018 CLINICAL DATA:  62 year old male with aphasia and left MCA territory infarct changes on noncontrast head CT today. EXAM: CT ANGIOGRAPHY HEAD AND NECK CT PERFUSION BRAIN TECHNIQUE: Multidetector CT imaging of the head and neck was performed using the standard protocol during bolus administration of intravenous  contrast. Multiplanar CT image reconstructions and MIPs were obtained to evaluate the vascular anatomy. Carotid stenosis measurements (when applicable) are obtained utilizing NASCET criteria, using the distal internal carotid diameter as the denominator. Multiphase CT imaging of the brain was performed following IV bolus contrast injection. Subsequent parametric perfusion maps were calculated using RAPID software. CONTRAST:  121m ISOVUE-370 IOPAMIDOL (ISOVUE-370) INJECTION 76% COMPARISON:  Head CT 1337 hours. Brain MRI and intracranial MRA 05/31/2016. FINDINGS: CT Brain Perfusion Findings: ASPECTS was 6 out of 10 at 1337 hours. No core infarct is detected by CBF (<30%) Volume: (0 milliliters) Perfusion (Tmax>6.0s) volume: 28 milliliters but with bilateral representation which is likely erroneously. M TT and T-max maps do indicate asymmetric abnormal perfusion parameters in the left MCA territory. Mismatch Volume: Not applicable CTA NECK Skeleton: No acute osseous abnormality identified. Cervical spine degeneration. Upper chest: Negative lung apices and superior mediastinum. Other neck: Contrast reflux in the paravertebral venous system due to left medial subclavian vein stenosis. Otherwise negative. Aortic arch: 3 vessel arch configuration. No arch atherosclerosis or great vessel origin stenosis. Right carotid system: Mild calcified plaque at the right ICA bulb, no stenosis. Left carotid system: Mild calcified plaque at the medial left ICA bulb without stenosis. Vertebral arteries: No proximal subclavian artery or vertebral artery origin atherosclerosis or stenosis. Mildly dominant left vertebral artery. V2 segment tortuosity. No vertebral artery plaque or stenosis to the skull base. CTA HEAD Posterior circulation: Mild distal vertebral artery irregularity without stenosis. Patent PICA origins and vertebrobasilar junction. Mild basilar artery irregularity without stenosis. Patent SCA and PCA origins. Small left  posterior communicating artery, the right is diminutive or absent. Minimal PCA branch irregularity without stenosis. Anterior circulation: Both ICA siphons are patent. Mild left siphon calcified plaque without stenosis. On the right there is up to moderate calcified plaque maximal at the supraclinoid segment, but no convincing stenosis. Normal ophthalmic and left posterior communicating artery origins. Patent carotid termini. Normal MCA and ACA origins. Anterior communicating artery and bilateral ACA branches are within normal limits. Right MCA M1 segment, right MCA bifurcation, and right MCA branches are within normal limits. The left MCA M1 segment bifurcates early. No M1 or bifurcation irregularity or stenosis. There is mild irregularity in the left MCA M2 branches most apparent on series 8, image 28. But when compared to the 2017 intracranial MRA, there is no left MCA branch occlusion identified. Venous sinuses: Not evaluated due to early contrast timing. Anatomic variants: Mildly dominant left vertebral artery. Review of the MIP images confirms the above findings IMPRESSION: 1. Discordant ASPECTS and CT Perfusion findings suggesting a subacute left MCA infarct with pseudo-normalized perfusion. 2. Mild irregularity of left MCA M2 branches, most apparent on series 8 image 28, with No left MCA branch or large vessel occlusion identified. 3.  Mild for age calcified carotid atherosclerosis in the head and neck. Mild posterior circulation atherosclerosis. No atherosclerotic stenosis identified. 4. Incidental left subclavian venous stenosis. Electronically Signed   By: Genevie Ann M.D.   On: 09/11/2018 14:10   Dg Chest Port 1 View  Result Date: 09/11/2018 CLINICAL DATA:  Altered mental status. EXAM: PORTABLE CHEST 1 VIEW COMPARISON:  10/04/2017. FINDINGS: Mediastinum and hilar structures normal. Lungs are clear. No pleural effusion or pneumothorax. No acute bony abnormality. IMPRESSION: No acute cardiopulmonary  disease. Electronically Signed   By: Marcello Moores  Register   On: 09/11/2018 13:16   Ct Head Code Stroke Wo Contrast  Result Date: 09/11/2018 CLINICAL DATA:  Code stroke. New onset aphasia. Last seen well unknown. EXAM: CT HEAD WITHOUT CONTRAST TECHNIQUE: Contiguous axial images were obtained from the base of the skull through the vertex without intravenous contrast. COMPARISON:  MRI brain 05/31/2016 FINDINGS: Brain: A nonhemorrhagic acute/subacute left MCA territory infarct is evident. There is diffuse involvement of the left lentiform nucleus. There is patchy decreased attenuation in the left caudate head. Cortical density in the left insular cortex is less than on the right. A focal area of cortical hypoattenuation is present along the left postcentral gyrus on image 24. Moderate subcortical white matter changes are present bilaterally, left greater than right. The basal ganglia infarct involves the anterior limb of the left internal capsule. The infarct also involves the left superior temporal gyrus. No hemorrhage is present. Ventricles are proportionate to the degree of atrophy. No significant extra-axial fluid collection is present. Vascular: Atherosclerotic calcifications are present within the cavernous internal carotid arteries bilaterally. There is no significant asymmetric hyperdense artery. : Calvarium is intact. No focal lytic or blastic lesions are present. Extracranial soft tissues are within normal limits. Sinuses/Orbits: Fluid is present in the left mastoid air cells. The remaining paranasal sinuses and mastoid air cells are clear. Other: ASPECTS (Ramirez-Perez Stroke Program Early CT Score) - Ganglionic level infarction (caudate, lentiform nuclei, internal capsule, insula, M1-M3 cortex): 4/7 - Supraganglionic infarction (M4-M6 cortex): 2/3 Total score (0-10 with 10 being normal): 6/10 IMPRESSION: 1. Acute/subacute left ACA territory infarcts involving the left lentiform nucleus, left superior temporal  gyrus, left postcentral gyrus, and left basal ganglia. 2. Scattered white matter disease is worse left than right. 3. Left mastoid effusion. No obstructing nasopharyngeal lesion is present. 4. ASPECTS is 6/10 These results were called by telephone at the time of interpretation on 09/11/2018 at 1:41 pm to Dr. Leonel Ramsay, who verbally acknowledged these results. Electronically Signed   By: San Morelle M.D.   On: 09/11/2018 13:42   No results found for this or any previous visit (from the past 240 hour(s)).  Microbiology: No results found for this or any previous visit (from the past 240 hour(s)).  Radiographs and labs were personally reviewed by me.   Bobby Rumpf, MD [facility] [phone] 09/11/2018, 5:20 PM

## 2018-09-11 NOTE — Code Documentation (Signed)
62yo male arriving to [facility] via [facility] at 1205. [place] from the bank where he presented with incomprehensible speech. EMS was called and transported him to the ED. Patient in atrial fibrillation with RVR. Code stroke called in the ED for aphasia. Stroke team to the bedside. Patient to radiology for CT followed by CTA and CTP. NIHSS 13, see documentation for details and code stroke times. Patient with mild right facial weakness and global aphasia on exam. No acute stroke treatment at this time d/t unknown LKW and CTA negative for LVO. Bedside handoff with ED RN Joni Reining.

## 2018-09-11 NOTE — ED Triage Notes (Signed)
Pt arrives via EMS from a bank, who called EMS feeling he wasn't safe to drive. Pt disoriented x4. Unable to give name without looking at drivers license. cbg 207, 20g LAC, 140s-160s irregular EKG, bp 100/70.

## 2018-09-11 NOTE — Progress Notes (Signed)
Patient arrived to 3W16 from ED at this time. Safety precautions and orders reviewed with patient. TELE applied and confirmed. VSS. Patient noted with expressive aphasia, he is able to agreed to correct answer and disagree to incorrect answer at this time. He is not able to follow command when RN ask him to do something but able to follow command when if RN show him. Urine obtained and sent to lab. He denied pain or distress. Will continue to monitor,   Sim Boast, RN

## 2018-09-11 NOTE — ED Notes (Signed)
Spoke with Demetrios Isaacs patient's friend and emergency contact. Per friend he is a provider he sees at [facility]. 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 States they will keep in contact with patient while hospitalized. States they haven't seen the patient in the clinic in approx a month, normally very intelligent, high functioning individual. Contact with further questions/concerns.

## 2018-09-11 NOTE — ED Notes (Signed)
Attempted to obtain urine specimen, pt st's does not have to urinate at this time

## 2018-09-11 NOTE — ED Provider Notes (Addendum)
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  Patient with anion gap metabolic acidosis likely secondary to lactic acid of unknown etiology.  TSH 1.731.  EtOH less than 10.  CTA head neck shows possible subacute infarct of left MCA territory.  No evidence of acute bleed.  Patient with anion gap metabolic acidosis with expressive aphasia and altered mental status thought secondary to subacute left MCA stroke.  No current intervention to be performed per neurology's recommendations.  Patient be admitted to Internal Medicine teaching service for post stroke work-up and management.  Final Clinical Impressions(s) / ED Diagnoses   Final diagnoses:  Disorientation  AKI (acute kidney injury) [facility])  Cerebrovascular accident (CVA), unspecified mechanism [facility])    ED Discharge Orders    None       Margit Banda, MD 09/11/18 1539    Margit Banda, MD 09/11/18 1540    Margit Banda, MD 09/11/18 1558    Gerhard Munch, MD 09/14/18 [phone]

## 2018-09-12 ENCOUNTER — Inpatient Hospital Stay (HOSPITAL_COMMUNITY): Payer: Medicaid Other

## 2018-09-12 ENCOUNTER — Encounter (HOSPITAL_COMMUNITY): Payer: Self-pay | Admitting: Internal Medicine

## 2018-09-12 DIAGNOSIS — I63522 Cerebral infarction due to unspecified occlusion or stenosis of left anterior cerebral artery: Secondary | ICD-10-CM

## 2018-09-12 DIAGNOSIS — I63312 Cerebral infarction due to thrombosis of left middle cerebral artery: Secondary | ICD-10-CM

## 2018-09-12 LAB — BASIC METABOLIC PANEL
Anion gap: 10 (ref 5–15)
BUN: 16 mg/dL (ref 8–23)
CHLORIDE: 104 mmol/L (ref 98–111)
CO2: 22 mmol/L (ref 22–32)
CREATININE: 1.32 mg/dL — AB (ref 0.61–1.24)
Calcium: 9 mg/dL (ref 8.9–10.3)
GFR calc Af Amer: >60 mL/min (ref >60)
GFR, EST NON AFRICAN AMERICAN: 56 mL/min — AB (ref >60)
Glucose, Bld: 95 mg/dL (ref 70–99)
Potassium: 4.2 mmol/L (ref 3.5–5.1)
SODIUM: 136 mmol/L (ref 135–145)

## 2018-09-12 LAB — T-HELPER CELLS (CD4) COUNT (NOT AT ARMC)
CD4 % Helper T Cell: 40 % (ref 33–55)
CD4 T CELL ABS: 790 /uL (ref 400–2700)

## 2018-09-12 LAB — LIPID PANEL
CHOL/HDL RATIO: 6.1 ratio
Cholesterol: 140 mg/dL (ref 0–200)
HDL: 23 mg/dL — AB (ref >40)
LDL CALC: 100 mg/dL — AB (ref 0–99)
Triglycerides: 85 mg/dL (ref <150)
VLDL: 17 mg/dL (ref 0–40)

## 2018-09-12 LAB — ECHOCARDIOGRAM COMPLETE

## 2018-09-12 MED ORDER — IOHEXOL 300 MG/ML  SOLN
100.0000 mL | Freq: Once | INTRAMUSCULAR | Status: AC | PRN
  Administered 2018-09-12: 100 mL via INTRAVENOUS

## 2018-09-12 MED ORDER — SOTALOL HCL 80 MG PO TABS
80.0000 mg | ORAL_TABLET | Freq: Two times a day (BID) | ORAL | Status: DC
  Administered 2018-09-12 – 2018-09-27 (×31): 80 mg via ORAL
  Filled 2018-09-12 (×31): qty 1

## 2018-09-12 MED ORDER — ROSUVASTATIN CALCIUM 20 MG PO TABS
40.0000 mg | ORAL_TABLET | Freq: Every day | ORAL | Status: DC
  Administered 2018-09-12 – 2018-09-26 (×14): 40 mg via ORAL
  Filled 2018-09-12 (×15): qty 2

## 2018-09-12 MED ORDER — ENOXAPARIN SODIUM 40 MG/0.4ML ~~LOC~~ SOLN
40.0000 mg | SUBCUTANEOUS | Status: DC
  Administered 2018-09-13: 40 mg via SUBCUTANEOUS
  Filled 2018-09-12: qty 0.4

## 2018-09-12 NOTE — Procedures (Signed)
History: 62 year old male being evaluated for aphasia  Sedation: None  Technique: This is a 21 channel routine scalp EEG performed at the bedside with bipolar and monopolar montages arranged in accordance to the international 10/20 system of electrode placement. One channel was dedicated to EKG recording.    Background: The background consists of intermixed alpha and beta activities. There is a well defined posterior dominant rhythm of 7-8 Hz that attenuates with eye opening.  This is better seen on the right than the left.  There is also mild generalized irregular delta activity.  Sleep is recorded with normal appearing structures.   Photic stimulation: Physiologic driving is not performed  EEG Abnormalities: 1) attenuation of the posterior dominant rhythm on the left 2) generalized irregular delta activity  Clinical Interpretation: This EEG is suggestive of a focal left hemispheric cerebral dysfunction consistent with his known stroke in the setting of a mild generalized nonspecific cerebral dysfunction (encephalopathy).    There was no seizure or seizure predisposition recorded on this study. Please note that a normal EEG does not preclude the possibility of epilepsy.   Ritta Slot, MD [facility] [phone]  If 7pm- 7am, please page neurology on call as listed in [facility]ION.

## 2018-09-12 NOTE — Progress Notes (Signed)
PT Cancellation Note  Patient Details Name: Arthur [name] MRN: [id number] DOB: [birth year]   Cancelled Treatment:    Reason Eval/Treat Not Completed: Patient at procedure or test/unavailable. Pt being transported to CT at this time. PT will continue to follow acutely.    Alessandra Bevels Simmone Cape 09/12/2018, 9:46 AM

## 2018-09-12 NOTE — Progress Notes (Signed)
Pt went from SR to A-fib RVR. EKG and VS done. MD paged and at bedside. Received new order to resume sotalol.

## 2018-09-12 NOTE — Evaluation (Signed)
Occupational Therapy Evaluation Patient Details Name: Arthur [name] MRN: [id number] DOB: Aug 10, 1956 Today's Date: 09/12/2018    History of Present Illness Pt is a 62 y/o male with PMH of HIV, alcohol abuse, HTN, bipolar disease, atrial fibrillation on chronic Xarelot, and history of TIA who presented expressive/receptive aphasia and intermittent confusion. Pt found to have multiple Lt sided subacute infarcts - embolic -left caudate head subacute hemorrhagic infarct likely from afib with sub therapeutic INR.   Clinical Impression   Patient supine in bed and agreeable to participate in OT session.  History unclear, as patient unable to provide history or home setup.  Per chart review, ED nurse speaking to emergency contact, patient normally a "very intelligent, high functioning individual"; therapist attempted to reach contact and was unable to.  Patient presents with significant expressive and receptive language difficulties throughout session.  Able to follow simple 1 step commands with inconsistency, approximately 50% of the time given verbal, visual and tactile cueing.  Limited sequencing of steps, as asked to "wash your hands" and he wet his hands with water, then wet his face and his hair; then was handed a wash cloth to dry his hands and he wet the cloth.  VSS throughout session. Based on performance today, believe patient will best benefit from intensive CIR level therapies in order to optimize independence and return to PLOF.  Will continue to follow.     Follow Up Recommendations  CIR;Supervision/Assistance - 24 hour    Equipment Recommendations  Other (comment)(TBD)    Recommendations for Other Services Rehab consult     Precautions / Restrictions Precautions Precautions: Fall Restrictions Weight Bearing Restrictions: No      Mobility Bed Mobility Overal bed mobility: Needs Assistance Bed Mobility: Supine to Sit;Sit to Supine     Supine to sit: Supervision Sit to supine:  Supervision   General bed mobility comments: increased time, supervision for safety  Transfers Overall transfer level: Needs assistance Equipment used: None Transfers: Sit to/from Stand Sit to Stand: Min assist         General transfer comment: min assist for safety and balance     Balance Overall balance assessment: Needs assistance Sitting-balance support: Feet supported Sitting balance-Leahy Scale: Good     Standing balance support: No upper extremity supported;During functional activity Standing balance-Leahy Scale: Fair Standing balance comment: min assist for safety and balance, 0 hand support grooming at sink                            ADL either performed or assessed with clinical judgement   ADL Overall ADL's : Needs assistance/impaired     Grooming: Minimal assistance;Standing Grooming Details (indicate cue type and reason): min assist to sequence steps for washing hands at sink Upper Body Bathing: Minimal assistance;Sitting   Lower Body Bathing: Moderate assistance;Sit to/from stand   Upper Body Dressing : Moderate assistance;Sitting   Lower Body Dressing: Moderate assistance;Sit to/from stand   Toilet Transfer: Minimal assistance;Ambulation(simulated in room)           Functional mobility during ADLs: Minimal assistance General ADL Comments: Pt completed bed mobility, short distance mobility in room with no AD, standing at sink to engage in grooming tasks.  ADLs difficult to assess due to inability to follow 1 step commands consistently.      Vision   Vision Assessment?: No apparent visual deficits Additional Comments: further assessment warrented, difficult to assess     Perception  Praxis      Pertinent Vitals/Pain Pain Assessment: Faces Faces Pain Scale: No hurt     Hand Dominance     Extremity/Trunk Assessment Upper Extremity Assessment Upper Extremity Assessment: Overall WFL for tasks assessed   Lower Extremity  Assessment Lower Extremity Assessment: Defer to PT evaluation       Communication Communication Communication: Receptive difficulties;Expressive difficulties   Cognition Arousal/Alertness: Awake/alert Behavior During Therapy: WFL for tasks assessed/performed;Flat affect Overall Cognitive Status: Impaired/Different from baseline Area of Impairment: Following commands;Problem solving                       Following Commands: Follows one step commands inconsistently;Follows one step commands with increased time     Problem Solving: Difficulty sequencing;Requires verbal cues;Requires tactile cues General Comments: pt able to follow simple commands inconsistently during session, pt able to follow visual and tactile cueing more consistently, then verbal cueing    General Comments       Exercises     Shoulder Instructions      Home Living Family/patient expects to be discharged to:: Unsure                                 Additional Comments: pt with significant expressive and receptive language difficulties with no family or caregivers present to provide any reliable information      Prior Functioning/Environment          Comments: Unsure - pt with significant expressive and receptive language difficulties with no family or caregivers present to provide any reliable information        OT Problem List: Decreased activity tolerance;Impaired balance (sitting and/or standing);Decreased cognition;Decreased knowledge of precautions;Decreased safety awareness      OT Treatment/Interventions: Self-care/ADL training;Therapeutic exercise;Neuromuscular education;Therapeutic activities;Visual/perceptual remediation/compensation;Cognitive remediation/compensation;Patient/family education;Balance training    OT Goals(Current goals can be found in the care plan section) Acute Rehab OT Goals Patient Stated Goal: unable to state OT Goal Formulation: Patient unable to  participate in goal setting Time For Goal Achievement: 09/26/18 Potential to Achieve Goals: Good  OT Frequency: Min 2X/week   Barriers to D/C:            Co-evaluation              AM-PAC PT "6 Clicks" Daily Activity     Outcome Measure Help from another person eating meals?: A Little Help from another person taking care of personal grooming?: A Little Help from another person toileting, which includes using toliet, bedpan, or urinal?: A Little Help from another person bathing (including washing, rinsing, drying)?: A Lot Help from another person to put on and taking off regular upper body clothing?: A Little Help from another person to put on and taking off regular lower body clothing?: A Lot 6 Click Score: 16   End of Session Equipment Utilized During Treatment: Gait belt Nurse Communication: Mobility status  Activity Tolerance: Patient tolerated treatment well Patient left: in bed;with call bell/phone within reach;with bed alarm set  OT Visit Diagnosis: Unsteadiness on feet (R26.81);Other symptoms and signs involving cognitive function;Cognitive communication deficit (R41.841) Symptoms and signs involving cognitive functions: Cerebral infarction                Time: 1610-9604 OT Time Calculation (min): 9 min Charges:  OT General Charges $OT Visit: 1 Visit OT Evaluation $OT Eval Moderate Complexity: 1 Mod  Chancy Milroy, OT Acute Rehabilitation Services  Pager [phone] Office [phone]   Chancy Milroy 09/12/2018, 5:03 PM

## 2018-09-12 NOTE — Evaluation (Addendum)
Physical Therapy Evaluation Patient Details Name: Arthur [name] MRN: [id number] DOB: 1956-11-13 Today's Date: 09/12/2018   History of Present Illness  Pt is a 62 y/o male with PMH of HIV, alcohol abuse, HTN, bipolar disease, atrial fibrillation on chronic Xarelot, and history of TIA who presented expressive/receptive aphasia and intermittent confusion. Pt found to have multiple Lt sided subacute infarcts - embolic -left caudate head subacute hemorrhagic infarct likely from afib with sub therapeutic INR.    Clinical Impression  Pt presented supine in bed with HOB elevated, awake and willing to participate in therapy session. Pt with significant expressive and receptive language difficulties with no family/caregivers present to provide any helpful information regarding PLOF or home environment. Pt currently very limited secondary to elevated HR with minimal activity. Pt's HR increased to as high as 192 bpm with sitting EOB. Therefore, further mobility deferred this session and pt's RN was notified. Pt would continue to benefit from skilled physical therapy services at this time while admitted and after d/c to address the below listed limitations in order to improve overall safety and independence with functional mobility.     Follow Up Recommendations CIR    Equipment Recommendations  None recommended by PT    Recommendations for Other Services       Precautions / Restrictions Precautions Precautions: Fall Restrictions Weight Bearing Restrictions: No      Mobility  Bed Mobility Overal bed mobility: Needs Assistance Bed Mobility: Supine to Sit;Sit to Supine     Supine to sit: Supervision Sit to supine: Supervision   General bed mobility comments: increased time, supervision for safety  Transfers                 General transfer comment: limited to sitting EOB secondary to increased HR to as high as 192 bpm (fluctuating from high 160's to low 192's); RN  aware  Ambulation/Gait                Stairs            Wheelchair Mobility    Modified Rankin (Stroke Patients Only) Modified Rankin (Stroke Patients Only) Pre-Morbid Rankin Score: No symptoms Modified Rankin: Moderate disability     Balance Overall balance assessment: Needs assistance Sitting-balance support: Feet supported Sitting balance-Leahy Scale: Good                                       Pertinent Vitals/Pain Pain Assessment: Faces Faces Pain Scale: No hurt    Home Living Family/patient expects to be discharged to:: Unsure                 Additional Comments: pt with significant expressive and receptive language difficulties with no family or caregivers present to provide any reliable information    Prior Function           Comments: Unsure - pt with significant expressive and receptive language difficulties with no family or caregivers present to provide any reliable information     Hand Dominance        Extremity/Trunk Assessment   Upper Extremity Assessment Upper Extremity Assessment: Overall WFL for tasks assessed    Lower Extremity Assessment Lower Extremity Assessment: Overall WFL for tasks assessed       Communication   Communication: Receptive difficulties;Expressive difficulties  Cognition Arousal/Alertness: Awake/alert Behavior During Therapy: WFL for tasks assessed/performed;Flat affect Overall Cognitive Status: Impaired/Different from baseline Area  of Impairment: Following commands;Problem solving                       Following Commands: Follows one step commands inconsistently;Follows one step commands with increased time     Problem Solving: Difficulty sequencing;Requires verbal cues;Requires tactile cues General Comments: required multimodal cueing      General Comments      Exercises     Assessment/Plan    PT Assessment Patient needs continued PT services  PT Problem  List Decreased balance;Decreased mobility;Decreased coordination;Decreased cognition;Decreased safety awareness;Decreased knowledge of precautions       PT Treatment Interventions DME instruction;Gait training;Stair training;Functional mobility training;Therapeutic activities;Neuromuscular re-education;Therapeutic exercise;Balance training;Cognitive remediation;Patient/family education    PT Goals (Current goals can be found in the Care Plan section)  Acute Rehab PT Goals Patient Stated Goal: unable to state PT Goal Formulation: Patient unable to participate in goal setting Time For Goal Achievement: 09/26/18 Potential to Achieve Goals: Good    Frequency Min 4X/week   Barriers to discharge        Co-evaluation               AM-PAC PT "6 Clicks" Daily Activity  Outcome Measure Difficulty turning over in bed (including adjusting bedclothes, sheets and blankets)?: None Difficulty moving from lying on back to sitting on the side of the bed? : None Difficulty sitting down on and standing up from a chair with arms (e.g., wheelchair, bedside commode, etc,.)?: A Little Help needed moving to and from a bed to chair (including a wheelchair)?: A Little Help needed walking in hospital room?: A Little Help needed climbing 3-5 steps with a railing? : A Little 6 Click Score: 20    End of Session   Activity Tolerance: Patient tolerated treatment well Patient left: in bed;with call bell/phone within reach;with bed alarm set Nurse Communication: Mobility status;Other (comment)(HR with activity) PT Visit Diagnosis: Other abnormalities of gait and mobility (R26.89);Other symptoms and signs involving the nervous system (R29.898)    Time: 0454-0981 PT Time Calculation (min) (ACUTE ONLY): 18 min   Charges:   PT Evaluation $PT Eval Moderate Complexity: 1 Mod          Deborah Chalk, PT, DPT  Acute Rehabilitation Services Pager [phone] Office [phone]    Alessandra Bevels  Mizuki Hoel 09/12/2018, 4:06 PM

## 2018-09-12 NOTE — Progress Notes (Signed)
  Echocardiogram 2D Echocardiogram has been performed.  Leta Jungling M 09/12/2018, 11:36 AM

## 2018-09-12 NOTE — Progress Notes (Signed)
EEG completed, results pending. 

## 2018-09-12 NOTE — Progress Notes (Addendum)
   Subjective: [name] is unable to answer any direct questions due to his receptive aphasia. He did state several times that he wanted to go home but did so in a grammatically incorrect way.   Objective:  Vital signs in last 24 hours: Vitals:   09/11/18 2034 09/11/18 2200 09/11/18 2345 09/12/18 0315  BP: (!) 133/95 109/86 122/78 116/86  Pulse: 76 68 69 (!) 58  Resp: 18 20 20 20   Temp: 97.9 F (36.6 C) 97.7 F (36.5 C) 97.6 F (36.4 C) 97.8 F (36.6 C)  TempSrc: Oral Oral Oral Oral  SpO2: 99% 100% 99% 100%   Physical Exam  Constitutional: He appears well-developed and well-nourished.  HENT:  Head: Normocephalic and atraumatic.  Eyes: Pupils are equal, round, and reactive to light. EOM are normal.  Neck: Normal range of motion.  Cardiovascular: Normal rate, regular rhythm and normal heart sounds.  Pulmonary/Chest: Effort normal and breath sounds normal.  Abdominal: Soft. Bowel sounds are normal.  Musculoskeletal:  No LE edema noted bilaterally  Neurological: He is alert.  [name] continues to have receptive aphasia. He is unable to follow verbal commands but does mimic your movements. I raised my arm to provide a visual cue and [name] did the same. When asked to read the sentence "I walk my dog," he states "yes I do" and at one point says "walk" in a sentence with other nonsensical words.  Skin: Skin is warm and dry.  Psychiatric:  Due to his receptive aphasia, I am unable to determine his mood.   Vitals reviewed.    Assessment/Plan:  Principal Problem:   CVA (cerebral vascular accident) (HCC) Active Problems:   Persistent atrial fibrillation   Acute kidney injury (HCC)   Lactic acidosis   Global aphasia   Bipolar 1 disorder (HCC)   Alcoholism (HCC)   Essential hypertension  Arthur [name] is a 62 year old male with known atrial fibrillation who presented with global aphasia and found to have left ACA/MCA territory infarct with bilateral white matter changes.  There is no clear etiology for his CVA (thrombotic vs embolic) but he has risk factors for both (HTN and atrial fibrillation on Xarelto unable to verify compliance). MRI showed findings consistent with left subacute/chronic left MCA territory infarcts. He also had evidence of an old isodense hemorrhage at the left caudate and lentiform nuclei not seen on previous CT. Repeat CT showed no significant change from previous CT. We are holding his home dose of Xarelto. EEG ordered today per neurology for concern for seizures causing his symptoms. TTE today unremarkable.  Left ACA/MCA Infarct 1. Appreciate neurology consult  2. Await EEG results 3. Passed swallow screen; Regular diet placed 4. Continue ASA 325 mg PO 5. Allow for permissive HTN up to 220/120 mmHg  Acute Kidney Injury: Creatinine trending downwards (now 1.32 from 1.85). Baseline of 1.04 eight months ago. No sign of intrinsic tubular and interstitial disease on renal ultrasound.  1. Continue maintenance IVF's  Lactic Acidosis: self-resolved (Last: 1.6 last night)  History of Alcoholism: Alcohol level today <10 - Clinical CIWA protocol; No Ativan ordered.   Persistent A-fib with RVR: He developed atrial fibrillation with variable heart rate (120's-150's) at 1430 today. He was hemodynamically stable when we examined him, sitting up conformably in his bed eating lunch.  1. Restart home sotalol 80 mg BID  Dispo: Anticipated discharge in approximately 2-3 days.   Synetta Shadow, MD 09/12/2018, 6:33 AM Pager: [phone]

## 2018-09-12 NOTE — Evaluation (Signed)
Clinical/Bedside Swallow Evaluation Patient Details  Name: Arthur [name] MRN: [id number] Date of Birth: 1956-04-16  Today's Date: 09/12/2018 Time: SLP Start Time (ACUTE ONLY): 1020 SLP Stop Time (ACUTE ONLY): 1045 SLP Time Calculation (min) (ACUTE ONLY): 25 min  Past Medical History:  Past Medical History:  Diagnosis Date  . Acute kidney injury (HCC) 09/11/2018  . Alcohol abuse 12/21/2016  . Atrial fibrillation (HCC)   . Bipolar disorder (HCC)   . CVA (cerebral vascular accident) (HCC) 09/11/2018  . Depression   . History of blood transfusion 12-12-1998  . History of radiation exposure 08/17/2017  . HIV (human immunodeficiency virus infection) (HCC)   . HTN (hypertension) 11/17/2016  . 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 Late syphilis   . Lower back pain 11/25/2015  . 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 Syncope due to orthostatic hypotension 10/04/2017  . TIA (transient ischemic attack) 11/17/2016   Past Surgical History:  Past Surgical History:  Procedure Laterality Date  . broken right leg Right 2000  . GASTRIC BYPASS  2012   [facility]   HPI:  62 year old male with known atrial fibrillation and subtherapeutic INR presenting globally mute.  MRI shows Multifocal signal abnormality involving the left cerebral hemisphere, consistent with late subacute to chronic left MCA territory infarcts. Evidence for associated hemorrhage at the left caudate and lentiform nuclei. No visible hemorrhage within this region on prior CT.    Assessment / Plan / Recommendation Clinical Impression  Pt demonstrates normal swallow function. No difficulty, able to self feed appropriately.  SLP Visit Diagnosis: Dysphagia, unspecified (R13.10)    Aspiration Risk       Diet Recommendation Regular;Thin liquid   Liquid Administration via: Cup;Straw Medication Administration: Whole meds with liquid Supervision: Patient able to self feed Postural Changes: Seated upright  at 90 degrees    Other  Recommendations Oral Care Recommendations: Patient independent with oral care   Follow up Recommendations None      Frequency and Duration            Prognosis        Swallow Study   General HPI: 62 year old male with known atrial fibrillation and subtherapeutic INR presenting globally mute.  MRI shows Multifocal signal abnormality involving the left cerebral hemisphere, consistent with late subacute to chronic left MCA territory infarcts. Evidence for associated hemorrhage at the left caudate and lentiform nuclei. No visible hemorrhage within this region on prior CT.  Type of Study: Bedside Swallow Evaluation Previous Swallow Assessment: none Diet Prior to this Study: NPO Temperature Spikes Noted: No Respiratory Status: Room air History of Recent Intubation: No Behavior/Cognition: Alert;Cooperative;Pleasant mood Oral Cavity Assessment: Within Functional Limits Oral Care Completed by SLP: No Oral Cavity - Dentition: Adequate natural dentition Vision: Functional for self-feeding Self-Feeding Abilities: Able to feed self Patient Positioning: Upright in bed Baseline Vocal Quality: Normal Volitional Cough: Strong Volitional Swallow: Able to elicit    Oral/Motor/Sensory Function Overall Oral Motor/Sensory Function: Within functional limits   Ice Chips     Thin Liquid Thin Liquid: Within functional limits Presentation: Cup;Straw    Nectar Thick Nectar Thick Liquid: Not tested   Honey Thick Honey Thick Liquid: Not tested   Puree Puree: Within functional limits   Solid     Solid: Within functional limits      Arthur [name], Arthur [name] 09/12/2018,11:37 AM

## 2018-09-12 NOTE — Progress Notes (Signed)
Inpatient Rehabilitation  Per PT request, patient was screened by Fae Pippin for appropriateness for an Inpatient Acute Rehab consult.  At this time we are planning to follow along for medical stabilization and therapy tolerance prior to requesting an Inpatient Rehab consult.  Call if questions.  Charlane Ferretti., CCC/SLP Admission Coordinator  [facility] Inpatient Rehabilitation  Cell [phone]

## 2018-09-12 NOTE — Progress Notes (Signed)
OT Cancellation Note  Patient Details Name: Luby Seamans MRN: [id number] DOB: [birth year]   Cancelled Treatment:    Reason Eval/Treat Not Completed: Patient at procedure or test/ unavailable. Pt being transported to CT.  Will continue to follow and initiate OT evaluation as able.   Chancy Milroy, OT Acute Rehabilitation Services Pager [phone] Office [phone]   Chancy Milroy 09/12/2018, 9:50 AM

## 2018-09-12 NOTE — Progress Notes (Signed)
Pt converted to sinus rhythm. HR in the 70s. Will continue to monitor pt.
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
## 2018-09-12 NOTE — Progress Notes (Signed)
STROKE TEAM PROGRESS NOTE   HISTORY OF PRESENT ILLNESS (per record) Arthur [name] is a 62 y.o. male with history of TIA, hypertension, hypertension, bipolar disease, and atrial fibrillation on chronic Coumadin along with alcohol abuse. [place] apparently [place] the [place] where he was noticed to have both expressive and receptive aphasia.  EMS was called and patient was brought to [facility].  At that time and currently there is known last normal.  Patient while in the hospital is globally aphasic, with right facial droop.  Code stroke CT was obtained showing acute/subacute left ACA territory infarcts involving the left Lentiform nucleus, left superior temporal gyrus, left postcentral gyrus and left basal ganglia.  There also appears to be scattered white matter disease worse on the right.  CTA of head and neck were obtained showing no large vessel occlusion.  As there is no large vessel occlusion patient was not a CTA candidate.  It should be noted that his INR is subtherapeutic at 1.12.  LKW: Unknown tpa given?: no, due to known last normal Premorbid modified Rankin scale (mRS): 0 NIHSS difficult to ascertain as patient is mute however I did obtain a 13   SUBJECTIVE (INTERVAL HISTORY) His RN and speech therapist are   at the bedside.  He remains globally aphasic.    OBJECTIVE Vitals:   09/11/18 2200 09/11/18 2345 09/12/18 0315 09/12/18 0730  BP: 109/86 122/78 116/86 (!) 157/101  Pulse: 68 69 (!) 58 78  Resp: 20 20 20 17   Temp: 97.7 F (36.5 C) 97.6 F (36.4 C) 97.8 F (36.6 C) 97.9 F (36.6 C)  TempSrc: Oral Oral Oral Oral  SpO2: 100% 99% 100% 100%    CBC:  Recent Labs  Lab 09/11/18 1218 09/11/18 1249  WBC 8.8  --   NEUTROABS 5.9  --   HGB 15.9 17.0  HCT 48.7 50.0  MCV 97.4  --   PLT 238  --     Basic Metabolic Panel:  Recent Labs  Lab 09/11/18 1218 09/11/18 1249 09/12/18 0603  NA 136 135 136  K 4.2 4.4 4.2  CL 100 101 104  CO2 19*  --  22  GLUCOSE  149* 144* 95  BUN 18 23 16   CREATININE 1.85* 1.70* 1.32*  CALCIUM 9.2  --  9.0    Lipid Panel:     Component Value Date/Time   CHOL 140 09/12/2018 0603   TRIG 85 09/12/2018 0603   HDL 23 (L) 09/12/2018 0603   CHOLHDL 6.1 09/12/2018 0603   VLDL 17 09/12/2018 0603   LDLCALC 100 (H) 09/12/2018 0603   LDLCALC 74 06/05/2018 0805   HgbA1c:  Lab Results  Component Value Date   HGBA1C 5.4 10/10/2016   Urine Drug Screen:     Component Value Date/Time   LABOPIA NONE DETECTED 09/11/2018 2150   COCAINSCRNUR NONE DETECTED 09/11/2018 2150   LABBENZ NONE DETECTED 09/11/2018 2150   AMPHETMU NONE DETECTED 09/11/2018 2150   THCU NONE DETECTED 09/11/2018 2150   LABBARB NONE DETECTED 09/11/2018 2150    Alcohol Level     Component Value Date/Time   ETH <10 09/11/2018 1218    IMAGING  Ct Angio Head W Or Wo Contrast Ct Angio Neck W Or Wo Contrast Ct Cerebral Perfusion W Contrast 09/11/2018 IMPRESSION:  1. Discordant ASPECTS and CT Perfusion findings suggesting a subacute left MCA infarct with pseudo-normalized perfusion.  2. Mild irregularity of left MCA M2 branches, most apparent on series 8 image 28, with No left  MCA branch or large vessel occlusion identified.  3. Mild for age calcified carotid atherosclerosis in the head and neck. Mild posterior circulation atherosclerosis. No atherosclerotic stenosis identified.  4. Incidental left subclavian venous stenosis.    Mr Brain Wo Contrast 09/11/2018 IMPRESSION:  1. Multifocal signal abnormality involving the left cerebral hemisphere, consistent with late subacute to chronic left MCA territory infarcts. Evidence for associated hemorrhage at the left caudate and lentiform nuclei. No visible hemorrhage within this region on prior CT. Short interval follow-up CT to ensure no evolving hemorrhage at this level suggested.  2. Additional signal abnormality at the right temporal and parietal convexities, likely related to chronic ischemia as  well.  3. No other acute intracranial abnormality.    Ct Head Code Stroke Wo Contrast 09/11/2018 IMPRESSION:  1. Acute/subacute left ACA territory infarcts involving the left lentiform nucleus, left superior temporal gyrus, left postcentral gyrus, and left basal ganglia.  2. Scattered white matter disease is worse left than right.  3. Left mastoid effusion. No obstructing nasopharyngeal lesion is present.  4. ASPECTS is 6/10    US Renal 09/11/2018 IMPRESSION:  Normal renal ultrasound.    Dg Chest Port 1 View 09/11/2018 IMPRESSION:  No acute cardiopulmonary disease.     Transthoracic Echocardiogram - pending 00/00/00   EEG - pending    PHYSICAL EXAM Blood pressure (!) 157/101, pulse 78, temperature 97.9 F (36.6 C), temperature source Oral, resp. rate 17, SpO2 100 %. Frail middle-aged Caucasian male currently not in distress. . Afebrile. Head is nontraumatic. Neck is supple without bruit.    Cardiac exam no murmur or gallop. Lungs are clear to auscultation. Distal pulses are well felt. Neurological Exam :  Awake and alert globally aphasic. Mildly nonfluent speech with tangential speech. Follows only occasional midline commands and gestures. Not able to name repeat. No paraphasias. No dysarthria. Extraocular moments are full range without nystagmus. Blinks to threat bilaterally. Face appears symmetric. Tongue midline. Motor system exam reveals no upper or lower extremity drift. Symmetric and equal strength in all 4 extremities. No focal weakness. Dependent for the symmetric. Plantars are downgoing. Gait not tested.     ASSESSMENT/PLAN [name] is a 62 y.o. male with history of TIA, hypertension, bipolar disease, HIV, and atrial fibrillation on chronic Coumadin (admission INR 1.12) (Xarelto listed on home medication list) along with alcohol abuse presenting with right facial droop and global aphasia. He did not receive IV t-PA due to unknown time of onset.   Stroke:   multiple Lt sided subacute infarcts - embolic -left caudate head subacute hemorrhagic infarct likely from afib with sub therapeutic INR.  Resultant  Global aphasia  CT head - Acute/subacute left ACA territory infarcts involving the left lentiform nucleus, left superior temporal gyrus, left postcentral gyrus, and left basal ganglia.   MRI head - Multifocal signal abnormality involving the left cerebral hemisphere, consistent with late subacute to chronic left MCA territory infarcts. Evidence for associated hemorrhage at the left caudate and lentiform.  Additional signal abnormality at the right temporal and parietal convexities.  MRA head - not performed  CTA H&N - suggesting a subacute left MCA infarct  Carotid Doppler - CTA neck performed - carotid dopplers not indicated.  2D Echo - pending  EEG - pending  LDL - 100  HgbA1c - pending  VTE prophylaxis - SCDs  Diet - NPO  warfarin daily prior to admission, now on aspirin 300 mg suppository daily  Patient counseled to be compliant with his antithrombotic  medications  Ongoing aggressive stroke risk factor management  Therapy recommendations:  pending  Disposition:  Pending  Hypertension  Stable . Permissive hypertension (OK if < 220/120) but gradually normalize in 5-7 days . Long-term BP goal normotensive  Hyperlipidemia  Lipid lowering medication PTA:  Crestor 10 mg daily  LDL 100, goal < 70  Current lipid lowering medication: Crestor 20 mg -> increase to 40 mg.  Continue statin at discharge    Other Stroke Risk Factors  Advanced age  ETOH use, advised to drink no more than 1 - 2 drink(s) a day  Obesity, There is no height or weight on file to calculate BMI., recommend weight loss, diet and exercise as appropriate   Hx stroke/TIA by imaging  Family hx stroke (mother)   Other Active Problems  Creatinine - 1.86->1.70->1.32  Need to clarify anticoagulation PTA as well as compliance.    Hospital  day # 1  I have personally examined this patient, reviewed notes, independently viewed imaging studies, participated in medical decision making and plan of care.ROS completed by me personally and pertinent positives fully documented  I have made any additions or clarifications directly to the above note.  He presented with an unclear onset of symptoms but clearly has a global aphasia and brain imaging study shows subacute left MCA subcortical and cortical infarcts including a small left caudate head hemorrhagic infarct. Etiology is likely embolism from atrial fibrillation on suboptimal anticoagulation. No family is available at the bedside. Continue evaluation by therapy and he will need anticoagulation at discharge but will continue aspirin for now. Greater than 50% time during this 35 minute visit was spent on counseling and coordination of care about his embolic strokes, planning plan of care and discussion with care team and answering questions.  Delia Heady, MD Medical Director [facility] Pager: [phone] 09/12/2018 1:32 PM   To contact Stroke Continuity provider, please refer to [email or web address]. After hours, contact General Neurology

## 2018-09-13 ENCOUNTER — Encounter (HOSPITAL_COMMUNITY): Payer: Self-pay | Admitting: Internal Medicine

## 2018-09-13 DIAGNOSIS — E785 Hyperlipidemia, unspecified: Secondary | ICD-10-CM

## 2018-09-13 DIAGNOSIS — I639 Cerebral infarction, unspecified: Secondary | ICD-10-CM

## 2018-09-13 DIAGNOSIS — B20 Human immunodeficiency virus [HIV] disease: Secondary | ICD-10-CM

## 2018-09-13 DIAGNOSIS — I6300 Cerebral infarction due to thrombosis of unspecified precerebral artery: Secondary | ICD-10-CM

## 2018-09-13 DIAGNOSIS — G8191 Hemiplegia, unspecified affecting right dominant side: Secondary | ICD-10-CM

## 2018-09-13 LAB — BASIC METABOLIC PANEL
ANION GAP: 7 (ref 5–15)
BUN: 11 mg/dL (ref 8–23)
CHLORIDE: 103 mmol/L (ref 98–111)
CO2: 22 mmol/L (ref 22–32)
CREATININE: 1.1 mg/dL (ref 0.61–1.24)
Calcium: 8.7 mg/dL — ABNORMAL LOW (ref 8.9–10.3)
GFR calc non Af Amer: >60 mL/min (ref >60)
Glucose, Bld: 98 mg/dL (ref 70–99)
POTASSIUM: 3.8 mmol/L (ref 3.5–5.1)
Sodium: 132 mmol/L — ABNORMAL LOW (ref 135–145)

## 2018-09-13 LAB — HIV-1 RNA QUANT-NO REFLEX-BLD
HIV 1 RNA Quant: <20 copies/mL
LOG10 HIV-1 RNA: UNDETERMINED log10copy/mL

## 2018-09-13 LAB — HEMOGLOBIN A1C
Hgb A1c MFr Bld: 5.6 % (ref 4.8–5.6)
MEAN PLASMA GLUCOSE: 114 mg/dL

## 2018-09-13 MED ORDER — RIVAROXABAN 20 MG PO TABS
20.0000 mg | ORAL_TABLET | Freq: Every day | ORAL | Status: DC
  Administered 2018-09-13 – 2018-09-26 (×13): 20 mg via ORAL
  Filled 2018-09-13 (×14): qty 1

## 2018-09-13 NOTE — Progress Notes (Addendum)
INFECTIOUS DISEASE PROGRESS NOTE  ID: Arthur [name] is a 62 y.o. male with  Principal Problem:   CVA (cerebral vascular accident) (HCC) Active Problems:   Bipolar 1 disorder (HCC)   Alcoholism (HCC)   Essential hypertension   Persistent atrial fibrillation   Global aphasia  Subjective: No complaints  Abtx:  Anti-infectives (From admission, onward)   Start     Dose/Rate Route Frequency Ordered Stop   09/11/18 2215  bictegravir-emtricitabine-tenofovir AF (BIKTARVY) 50-200-25 MG per tablet 1 tablet     1 tablet Oral Daily 09/11/18 1744        Medications:  Scheduled: .  stroke: mapping our early stages of recovery book   Does not apply Once  . ARIPiprazole  15 mg Oral Daily  . aspirin  300 mg Rectal Daily   Or  . aspirin  325 mg Oral Daily  . bictegravir-emtricitabine-tenofovir AF  1 tablet Oral Daily  . DULoxetine  120 mg Oral Daily  . enoxaparin (LOVENOX) injection  40 mg Subcutaneous Q24H  . mirtazapine  15 mg Oral QHS  . rosuvastatin  40 mg Oral q1800  . senna-docusate  1 tablet Oral BID  . sotalol  80 mg Oral Q12H  . tamsulosin  0.4 mg Oral BID    Objective: Vital signs in last 24 hours: Temp:  [97.8 F (36.6 C)-98.3 F (36.8 C)] 98 F (36.7 C) (10/03 0837) Pulse Rate:  [55-75] 55 (10/03 0837) Resp:  [16-18] 16 (10/03 0837) BP: (102-119)/(73-85) 106/77 (10/03 0837) SpO2:  [95 %-100 %] 100 % (10/03 0837)   General appearance: alert, cooperative and no distress Resp: clear to auscultation bilaterally Cardio: regular rate and rhythm GI: normal findings: bowel sounds normal and soft, non-tender Extremities: edema none Neurologic: Motor: R facial droop   Lab Results Recent Labs    09/11/18 1218 09/11/18 1249 09/12/18 0603 09/13/18 0435  WBC 8.8  --   --   --   HGB 15.9 17.0  --   --   HCT 48.7 50.0  --   --   NA 136 135 136 132*  K 4.2 4.4 4.2 3.8  CL 100 101 104 103  CO2 19*  --  22 22  BUN 18 23 16 11   CREATININE 1.85* 1.70* 1.32* 1.10    Liver Panel Recent Labs    09/11/18 1218  PROT 6.7  ALBUMIN 3.7  AST 24  ALT 11  ALKPHOS 59  BILITOT 1.2   Sedimentation Rate No results for input(s): ESRSEDRATE in the last 72 hours. C-Reactive Protein No results for input(s): CRP in the last 72 hours.  Microbiology: No results found for this or any previous visit (from the past 240 hour(s)).  Studies/Results: Ct Angio Head W Or Wo Contrast  Result Date: 09/11/2018 CLINICAL DATA:  62 year old male with aphasia and left MCA territory infarct changes on noncontrast head CT today. EXAM: CT ANGIOGRAPHY HEAD AND NECK CT PERFUSION BRAIN TECHNIQUE: Multidetector CT imaging of the head and neck was performed using the standard protocol during bolus administration of intravenous contrast. Multiplanar CT image reconstructions and MIPs were obtained to evaluate the vascular anatomy. Carotid stenosis measurements (when applicable) are obtained utilizing NASCET criteria, using the distal internal carotid diameter as the denominator. Multiphase CT imaging of the brain was performed following IV bolus contrast injection. Subsequent parametric perfusion maps were calculated using RAPID software. CONTRAST:  ISOVUE-370 IOPAMIDOL (ISOVUE-370) INJECTION 76% COMPARISON:  Head CT 1337 hours. Brain MRI and intracranial MRA 05/31/2016. FINDINGS:  CT Brain Perfusion Findings: ASPECTS was 6 out of 10 at 1337 hours. No core infarct is detected by CBF (<30%) Volume: (0 milliliters) Perfusion (Tmax>6.0s) volume: 28 milliliters but with bilateral representation which is likely erroneously. M TT and T-max maps do indicate asymmetric abnormal perfusion parameters in the left MCA territory. Mismatch Volume: Not applicable CTA NECK Skeleton: No acute osseous abnormality identified. Cervical spine degeneration. Upper chest: Negative lung apices and superior mediastinum. Other neck: Contrast reflux in the paravertebral venous system due to left medial subclavian vein  stenosis. Otherwise negative. Aortic arch: 3 vessel arch configuration. No arch atherosclerosis or great vessel origin stenosis. Right carotid system: Mild calcified plaque at the right ICA bulb, no stenosis. Left carotid system: Mild calcified plaque at the medial left ICA bulb without stenosis. Vertebral arteries: No proximal subclavian artery or vertebral artery origin atherosclerosis or stenosis. Mildly dominant left vertebral artery. V2 segment tortuosity. No vertebral artery plaque or stenosis to the skull base. CTA HEAD Posterior circulation: Mild distal vertebral artery irregularity without stenosis. Patent PICA origins and vertebrobasilar junction. Mild basilar artery irregularity without stenosis. Patent SCA and PCA origins. Small left posterior communicating artery, the right is diminutive or absent. Minimal PCA branch irregularity without stenosis. Anterior circulation: Both ICA siphons are patent. Mild left siphon calcified plaque without stenosis. On the right there is up to moderate calcified plaque maximal at the supraclinoid segment, but no convincing stenosis. Normal ophthalmic and left posterior communicating artery origins. Patent carotid termini. Normal MCA and ACA origins. Anterior communicating artery and bilateral ACA branches are within normal limits. Right MCA M1 segment, right MCA bifurcation, and right MCA branches are within normal limits. The left MCA M1 segment bifurcates early. No M1 or bifurcation irregularity or stenosis. There is mild irregularity in the left MCA M2 branches most apparent on series 8, image 28. But when compared to the 2017 intracranial MRA, there is no left MCA branch occlusion identified. Venous sinuses: Not evaluated due to early contrast timing. Anatomic variants: Mildly dominant left vertebral artery. Review of the MIP images confirms the above findings IMPRESSION: 1. Discordant ASPECTS and CT Perfusion findings suggesting a subacute left MCA infarct with  pseudo-normalized perfusion. 2. Mild irregularity of left MCA M2 branches, most apparent on series 8 image 28, with No left MCA branch or large vessel occlusion identified. 3. Mild for age calcified carotid atherosclerosis in the head and neck. Mild posterior circulation atherosclerosis. No atherosclerotic stenosis identified. 4. Incidental left subclavian venous stenosis. Electronically Signed   By: Odessa Fleming M.D.   On: 09/11/2018 14:10   Ct Head W & Wo Contrast  Result Date: 09/12/2018 CLINICAL DATA:  Aphasia.  Follow-up stroke EXAM: CT HEAD WITHOUT AND WITH CONTRAST TECHNIQUE: Contiguous axial images were obtained from the base of the skull through the vertex without and with intravenous contrast CONTRAST:  OMNIPAQUE IOHEXOL 300 MG/ML  SOLN COMPARISON:  Brain MRI from yesterday FINDINGS: Brain: There is ring enhancement around a isodense area at the left caudate head. Wispy enhancement more inferiorly along the putamen. This had the appearance of a lateral lenticulostriate infarct with nonacute hematoma at the caudate head on prior MRI. There is also superficial cortical enhancement in the superficial left temporal lobe, extending into the parietal lobe, also in the MCA distribution and most consistent with late subacute infarct. Remote superficial right temporal lobe infarct. Vascular: Atherosclerotic calcification Skull: Normal. Negative for fracture or focal lesion. Sinuses/Orbits: Partial left mastoid opacification. IMPRESSION: Findings of late subacute  infarct in the left MCA territory affecting lateral lenticulostriate and the inferior division. Ring enhancement at the left caudate head is best attributed to nonacute hematoma when correlated with MRI. Electronically Signed   By: Marnee Spring M.D.   On: 09/12/2018 10:23   Ct Angio Neck W Or Wo Contrast  Result Date: 09/11/2018 CLINICAL DATA:  62 year old male with aphasia and left MCA territory infarct changes on noncontrast head CT today.  EXAM: CT ANGIOGRAPHY HEAD AND NECK CT PERFUSION BRAIN TECHNIQUE: Multidetector CT imaging of the head and neck was performed using the standard protocol during bolus administration of intravenous contrast. Multiplanar CT image reconstructions and MIPs were obtained to evaluate the vascular anatomy. Carotid stenosis measurements (when applicable) are obtained utilizing NASCET criteria, using the distal internal carotid diameter as the denominator. Multiphase CT imaging of the brain was performed following IV bolus contrast injection. Subsequent parametric perfusion maps were calculated using RAPID software. CONTRAST:  ISOVUE-370 IOPAMIDOL (ISOVUE-370) INJECTION 76% COMPARISON:  Head CT 1337 hours. Brain MRI and intracranial MRA 05/31/2016. FINDINGS: CT Brain Perfusion Findings: ASPECTS was 6 out of 10 at 1337 hours. No core infarct is detected by CBF (<30%) Volume: (0 milliliters) Perfusion (Tmax>6.0s) volume: 28 milliliters but with bilateral representation which is likely erroneously. M TT and T-max maps do indicate asymmetric abnormal perfusion parameters in the left MCA territory. Mismatch Volume: Not applicable CTA NECK Skeleton: No acute osseous abnormality identified. Cervical spine degeneration. Upper chest: Negative lung apices and superior mediastinum. Other neck: Contrast reflux in the paravertebral venous system due to left medial subclavian vein stenosis. Otherwise negative. Aortic arch: 3 vessel arch configuration. No arch atherosclerosis or great vessel origin stenosis. Right carotid system: Mild calcified plaque at the right ICA bulb, no stenosis. Left carotid system: Mild calcified plaque at the medial left ICA bulb without stenosis. Vertebral arteries: No proximal subclavian artery or vertebral artery origin atherosclerosis or stenosis. Mildly dominant left vertebral artery. V2 segment tortuosity. No vertebral artery plaque or stenosis to the skull base. CTA HEAD Posterior circulation: Mild  distal vertebral artery irregularity without stenosis. Patent PICA origins and vertebrobasilar junction. Mild basilar artery irregularity without stenosis. Patent SCA and PCA origins. Small left posterior communicating artery, the right is diminutive or absent. Minimal PCA branch irregularity without stenosis. Anterior circulation: Both ICA siphons are patent. Mild left siphon calcified plaque without stenosis. On the right there is up to moderate calcified plaque maximal at the supraclinoid segment, but no convincing stenosis. Normal ophthalmic and left posterior communicating artery origins. Patent carotid termini. Normal MCA and ACA origins. Anterior communicating artery and bilateral ACA branches are within normal limits. Right MCA M1 segment, right MCA bifurcation, and right MCA branches are within normal limits. The left MCA M1 segment bifurcates early. No M1 or bifurcation irregularity or stenosis. There is mild irregularity in the left MCA M2 branches most apparent on series 8, image 28. But when compared to the 2017 intracranial MRA, there is no left MCA branch occlusion identified. Venous sinuses: Not evaluated due to early contrast timing. Anatomic variants: Mildly dominant left vertebral artery. Review of the MIP images confirms the above findings IMPRESSION: 1. Discordant ASPECTS and CT Perfusion findings suggesting a subacute left MCA infarct with pseudo-normalized perfusion. 2. Mild irregularity of left MCA M2 branches, most apparent on series 8 image 28, with No left MCA branch or large vessel occlusion identified. 3. Mild for age calcified carotid atherosclerosis in the head and neck. Mild posterior circulation atherosclerosis. No atherosclerotic  stenosis identified. 4. Incidental left subclavian venous stenosis. Electronically Signed   By: Odessa Fleming M.D.   On: 09/11/2018 14:10   Mr Brain Wo Contrast  Result Date: 09/11/2018 CLINICAL DATA:  Initial evaluation for global aphasia, stroke. EXAM: MRI  HEAD WITHOUT CONTRAST TECHNIQUE: Multiplanar, multiecho pulse sequences of the brain and surrounding structures were obtained without intravenous contrast. COMPARISON:  Prior CTs from earlier the same day. FINDINGS: Brain: Examination moderately degraded by motion artifact. Scattered T2/FLAIR hyperintensity seen involving the periventricular and deep white matter of the left corona radiata, with involvement of the left periatrial white matter. Mild diffusion abnormality within these regions. Overlying scattered cortical FLAIR signal abnormality within the left frontoparietal and temporal lobes. Encephalomalacia at the anterior left temporal lobe. Findings consistent with late subacute to chronic left MCA territory infarcts. Additionally, there is involvement of the left basal ganglia. Associated susceptibility artifact with T1 hyperintensity at the left caudate and lentiform nucleus consistent with hemorrhage. No visible hemorrhage within this region on prior CT, suggesting at this is subacute or chronic in nature. Additional FLAIR signal abnormality at the right temporal and parietal convexities, also likely related to remote right MCA territory infarcts. No other evidence for acute ischemia. Underlying age-related cerebral atrophy. No mass lesion, midline shift, or mass effect. No hydrocephalus. No extra-axial fluid collection. Pituitary gland within normal limits. Vascular: Major intracranial vascular flow voids maintained. Skull and upper cervical spine: Craniocervical junction normal. Upper cervical spine normal. Bone marrow signal intensity within normal limits. Scalp soft tissues unremarkable. Sinuses/Orbits: Globes and orbital soft tissues within normal limits. Paranasal sinuses clear. Left mastoid effusion noted, likely benign. Other: None. IMPRESSION: 1. Multifocal signal abnormality involving the left cerebral hemisphere, consistent with late subacute to chronic left MCA territory infarcts. Evidence for  associated hemorrhage at the left caudate and lentiform nuclei. No visible hemorrhage within this region on prior CT. Short interval follow-up CT to ensure no evolving hemorrhage at this level suggested. 2. Additional signal abnormality at the right temporal and parietal convexities, likely related to chronic ischemia as well. 3. No other acute intracranial abnormality. Electronically Signed   By: Rise Mu M.D.   On: 09/11/2018 20:18   US Renal  Result Date: 09/11/2018 CLINICAL DATA:  Acute renal failure. EXAM: RENAL / URINARY TRACT ULTRASOUND COMPLETE COMPARISON:  None. FINDINGS: Right Kidney: Length: 10 cm. Echogenicity within normal limits. No mass or hydronephrosis visualized. Left Kidney: Length: 10.8 cm. Echogenicity within normal limits. No mass or hydronephrosis visualized. Bladder: Appears normal for degree of bladder distention. IMPRESSION: Normal renal ultrasound. Electronically Signed   By: Amie Portland M.D.   On: 09/11/2018 18:58   Ct Cerebral Perfusion W Contrast  Result Date: 09/11/2018 CLINICAL DATA:  62 year old male with aphasia and left MCA territory infarct changes on noncontrast head CT today. EXAM: CT ANGIOGRAPHY HEAD AND NECK CT PERFUSION BRAIN TECHNIQUE: Multidetector CT imaging of the head and neck was performed using the standard protocol during bolus administration of intravenous contrast. Multiplanar CT image reconstructions and MIPs were obtained to evaluate the vascular anatomy. Carotid stenosis measurements (when applicable) are obtained utilizing NASCET criteria, using the distal internal carotid diameter as the denominator. Multiphase CT imaging of the brain was performed following IV bolus contrast injection. Subsequent parametric perfusion maps were calculated using RAPID software. CONTRAST:  ISOVUE-370 IOPAMIDOL (ISOVUE-370) INJECTION 76% COMPARISON:  Head CT 1337 hours. Brain MRI and intracranial MRA 05/31/2016. FINDINGS: CT Brain Perfusion Findings:  ASPECTS was 6 out of 10 at  1337 hours. No core infarct is detected by CBF (<30%) Volume: (0 milliliters) Perfusion (Tmax>6.0s) volume: 28 milliliters but with bilateral representation which is likely erroneously. M TT and T-max maps do indicate asymmetric abnormal perfusion parameters in the left MCA territory. Mismatch Volume: Not applicable CTA NECK Skeleton: No acute osseous abnormality identified. Cervical spine degeneration. Upper chest: Negative lung apices and superior mediastinum. Other neck: Contrast reflux in the paravertebral venous system due to left medial subclavian vein stenosis. Otherwise negative. Aortic arch: 3 vessel arch configuration. No arch atherosclerosis or great vessel origin stenosis. Right carotid system: Mild calcified plaque at the right ICA bulb, no stenosis. Left carotid system: Mild calcified plaque at the medial left ICA bulb without stenosis. Vertebral arteries: No proximal subclavian artery or vertebral artery origin atherosclerosis or stenosis. Mildly dominant left vertebral artery. V2 segment tortuosity. No vertebral artery plaque or stenosis to the skull base. CTA HEAD Posterior circulation: Mild distal vertebral artery irregularity without stenosis. Patent PICA origins and vertebrobasilar junction. Mild basilar artery irregularity without stenosis. Patent SCA and PCA origins. Small left posterior communicating artery, the right is diminutive or absent. Minimal PCA branch irregularity without stenosis. Anterior circulation: Both ICA siphons are patent. Mild left siphon calcified plaque without stenosis. On the right there is up to moderate calcified plaque maximal at the supraclinoid segment, but no convincing stenosis. Normal ophthalmic and left posterior communicating artery origins. Patent carotid termini. Normal MCA and ACA origins. Anterior communicating artery and bilateral ACA branches are within normal limits. Right MCA M1 segment, right MCA bifurcation, and right MCA  branches are within normal limits. The left MCA M1 segment bifurcates early. No M1 or bifurcation irregularity or stenosis. There is mild irregularity in the left MCA M2 branches most apparent on series 8, image 28. But when compared to the 2017 intracranial MRA, there is no left MCA branch occlusion identified. Venous sinuses: Not evaluated due to early contrast timing. Anatomic variants: Mildly dominant left vertebral artery. Review of the MIP images confirms the above findings IMPRESSION: 1. Discordant ASPECTS and CT Perfusion findings suggesting a subacute left MCA infarct with pseudo-normalized perfusion. 2. Mild irregularity of left MCA M2 branches, most apparent on series 8 image 28, with No left MCA branch or large vessel occlusion identified. 3. Mild for age calcified carotid atherosclerosis in the head and neck. Mild posterior circulation atherosclerosis. No atherosclerotic stenosis identified. 4. Incidental left subclavian venous stenosis. Electronically Signed   By: Odessa Fleming M.D.   On: 09/11/2018 14:10   Dg Chest Port 1 View  Result Date: 09/11/2018 CLINICAL DATA:  Altered mental status. EXAM: PORTABLE CHEST 1 VIEW COMPARISON:  10/04/2017. FINDINGS: Mediastinum and hilar structures normal. Lungs are clear. No pleural effusion or pneumothorax. No acute bony abnormality. IMPRESSION: No acute cardiopulmonary disease. Electronically Signed   By: Maisie Fus  Register   On: 09/11/2018 13:16   Ct Head Code Stroke Wo Contrast  Result Date: 09/11/2018 CLINICAL DATA:  Code stroke. New onset aphasia. Last seen well unknown. EXAM: CT HEAD WITHOUT CONTRAST TECHNIQUE: Contiguous axial images were obtained from the base of the skull through the vertex without intravenous contrast. COMPARISON:  MRI brain 05/31/2016 FINDINGS: Brain: A nonhemorrhagic acute/subacute left MCA territory infarct is evident. There is diffuse involvement of the left lentiform nucleus. There is patchy decreased attenuation in the left caudate  head. Cortical density in the left insular cortex is less than on the right. A focal area of cortical hypoattenuation is present along the left postcentral gyrus  on image 24. Moderate subcortical white matter changes are present bilaterally, left greater than right. The basal ganglia infarct involves the anterior limb of the left internal capsule. The infarct also involves the left superior temporal gyrus. No hemorrhage is present. Ventricles are proportionate to the degree of atrophy. No significant extra-axial fluid collection is present. Vascular: Atherosclerotic calcifications are present within the cavernous internal carotid arteries bilaterally. There is no significant asymmetric hyperdense artery. : Calvarium is intact. No focal lytic or blastic lesions are present. Extracranial soft tissues are within normal limits. Sinuses/Orbits: Fluid is present in the left mastoid air cells. The remaining paranasal sinuses and mastoid air cells are clear. Other: ASPECTS ([place] Stroke Program Early CT Score) - Ganglionic level infarction (caudate, lentiform nuclei, internal capsule, insula, M1-M3 cortex): 4/7 - Supraganglionic infarction (M4-M6 cortex): 2/3 Total score (0-10 with 10 being normal): 6/10 IMPRESSION: 1. Acute/subacute left ACA territory infarcts involving the left lentiform nucleus, left superior temporal gyrus, left postcentral gyrus, and left basal ganglia. 2. Scattered white matter disease is worse left than right. 3. Left mastoid effusion. No obstructing nasopharyngeal lesion is present. 4. ASPECTS is 6/10 These results were called by telephone at the time of interpretation on 09/11/2018 at 1:41 pm to Dr. Amada Jupiter, who verbally acknowledged these results. Electronically Signed   By: Marin Roberts M.D.   On: 09/11/2018 13:42     Assessment/Plan: HIV+ CVA Acidosis, AKI  His acidosis and AKI have resolved.  Would continue his biktarvy. It is working well, explained to pt Appreciate  IMTS and neuro f/u appreciate rehab eval.  ID is available as needed.    HIV 1 RNA Quant (copies/mL)  Date Value  06/05/2018 <20 DETECTED (A)  12/07/2017 <20 DETECTED (A)  06/08/2017 <20 NOT DETECTED   HIV-1 RNA Viral Load (no units)  Date Value  10/19/2015 <40  08/31/2015 <40  07/07/2015 <40   CD4 (no units)  Date Value  10/19/2015 1,056  06/18/2015 744  05/12/2015 880   CD4 T Cell Abs (/uL)  Date Value  09/11/2018 790  06/05/2018 1,020  12/07/2017 870            Johny Sax MD, FACP Infectious Diseases (pager) [phone] [email or web address] 09/13/2018, 10:08 AM  LOS: 2 days

## 2018-09-13 NOTE — Progress Notes (Signed)
Physical Therapy Treatment Patient Details Name: Arthur [name] MRN: [id number] DOB: 1956-08-21 Today's Date: 09/13/2018    History of Present Illness Pt is a 62 y/o male with PMH of HIV, alcohol abuse, HTN, bipolar disease, atrial fibrillation on chronic Xarelot, and history of TIA who presented expressive/receptive aphasia and intermittent confusion. Pt found to have multiple Lt sided subacute infarcts - embolic -left caudate head subacute hemorrhagic infarct likely from afib with sub therapeutic INR.    PT Comments    Patient seen for mobility progression. HR WNL throughout session. Pt presents with balance deficits and difficulty follow single step cues due to expressive/receptive deficits. Pt does follow visual and tactile cues fairly consistently for mobility tasks. Pt requires min guard for functional transfers and min A for gait training. Continue to progress as tolerated.   Follow Up Recommendations  CIR     Equipment Recommendations  None recommended by PT    Recommendations for Other Services       Precautions / Restrictions Precautions Precautions: Fall Restrictions Weight Bearing Restrictions: No    Mobility  Bed Mobility Overal bed mobility: Needs Assistance Bed Mobility: Supine to Sit     Supine to sit: Supervision     General bed mobility comments: for safety  Transfers Overall transfer level: Needs assistance Equipment used: None Transfers: Sit to/from Stand Sit to Stand: Min guard         General transfer comment: min guard for safety  Ambulation/Gait Ambulation/Gait assistance: Min assist Gait Distance (Feet): 200 Feet Assistive device: (assist at trunk with use of gait belt) Gait Pattern/deviations: Step-through pattern;Decreased step length - right;Decreased step length - left;Shuffle;Decreased stance time - left Gait velocity: decreased   General Gait Details: multimodal cues for increased stride length however pt unable to demonstrate a  significant change; pt with unsteady gait and LOB X1 requiring increased assistance to recover   Stairs Stairs: Yes Stairs assistance: Min assist Stair Management: Two rails;Alternating pattern;Forwards Number of Stairs: 2     Wheelchair Mobility    Modified Rankin (Stroke Patients Only)       Balance Overall balance assessment: Needs assistance Sitting-balance support: Feet supported Sitting balance-Leahy Scale: Good     Standing balance support: No upper extremity supported;During functional activity Standing balance-Leahy Scale: Fair                              Cognition Arousal/Alertness: Awake/alert Behavior During Therapy: WFL for tasks assessed/performed;Flat affect Overall Cognitive Status: Impaired/Different from baseline Area of Impairment: Following commands;Problem solving                       Following Commands: Follows one step commands inconsistently;Follows one step commands with increased time     Problem Solving: Difficulty sequencing;Requires verbal cues;Requires tactile cues General Comments: pt able to follow simple commands inconsistently during session, pt able to follow visual and tactile cueing more consistently, then verbal cueing       Exercises      General Comments General comments (skin integrity, edema, etc.): HR WNL      Pertinent Vitals/Pain Pain Assessment: Faces Faces Pain Scale: No hurt    Home Living                      Prior Function            PT Goals (current goals can now be found in the  care plan section) Acute Rehab PT Goals Patient Stated Goal: unable to state Progress towards PT goals: Progressing toward goals    Frequency    Min 4X/week      PT Plan Current plan remains appropriate    Co-evaluation              AM-PAC PT "6 Clicks" Daily Activity  Outcome Measure  Difficulty turning over in bed (including adjusting bedclothes, sheets and blankets)?:  None Difficulty moving from lying on back to sitting on the side of the bed? : None Difficulty sitting down on and standing up from a chair with arms (e.g., wheelchair, bedside commode, etc,.)?: Unable Help needed moving to and from a bed to chair (including a wheelchair)?: A Little Help needed walking in hospital room?: A Little Help needed climbing 3-5 steps with a railing? : A Little 6 Click Score: 18    End of Session Equipment Utilized During Treatment: Gait belt Activity Tolerance: Patient tolerated treatment well Patient left: in chair;with call bell/phone within reach;with chair alarm set Nurse Communication: Mobility status PT Visit Diagnosis: Other abnormalities of gait and mobility (R26.89);Other symptoms and signs involving the nervous system (R29.898)     Time: 4098-1191 PT Time Calculation (min) (ACUTE ONLY): 22 min  Charges:  $Gait Training: 8-22 mins                     Erline Levine, PTA Acute Rehabilitation Services Pager: [phone] Office: [phone]     Carolynne Edouard 09/13/2018, 2:23 PM

## 2018-09-13 NOTE — Progress Notes (Signed)
Occupational Therapy Treatment Patient Details Name: Arthur [name] MRN: [id number] DOB: [birth year] Today's Date: 09/13/2018    History of present illness Pt is a 62 y/o male with PMH of HIV, alcohol abuse, HTN, bipolar disease, atrial fibrillation on chronic Xarelot, and history of TIA who presented expressive/receptive aphasia and intermittent confusion. Pt found to have multiple Lt sided subacute infarcts - embolic -left caudate head subacute hemorrhagic infarct likely from afib with sub therapeutic INR.   OT comments  Patient willing to participate in OT session.  Following 1 step commands inconsistently during session, requires hand over hand support to initiate and sequence grooming tasks (oral care and washing hands) standing at sink.  Noted 1 LOB towards R side with dynamic reaching during grooming tasks.  Attempted writing tasks with patient, to copy shapes, with no success.  Significantly limited by expressive/receptive deficits, and continue to believe CIR level therapies will best benefit pt.  Will continue to follow.      Follow Up Recommendations  CIR;Supervision/Assistance - 24 hour    Equipment Recommendations  Other (comment)(TBD)    Recommendations for Other Services Rehab consult    Precautions / Restrictions Precautions Precautions: Fall Restrictions Weight Bearing Restrictions: No       Mobility Bed Mobility Overal bed mobility: Needs Assistance Bed Mobility: Supine to Sit     Supine to sit: Supervision     General bed mobility comments: for safety  Transfers Overall transfer level: Needs assistance Equipment used: None Transfers: Sit to/from Stand Sit to Stand: Min guard         General transfer comment: min guard for safety    Balance Overall balance assessment: Needs assistance Sitting-balance support: Feet supported Sitting balance-Leahy Scale: Good     Standing balance support: No upper extremity supported;During functional  activity Standing balance-Leahy Scale: Fair Standing balance comment: noted 1 LOB towards R side during dynamic reaching at sink with modA to correct                           ADL either performed or assessed with clinical judgement   ADL Overall ADL's : Needs assistance/impaired     Grooming: Standing;Moderate assistance Grooming Details (indicate cue type and reason): mod assist to sequence steps for washing hands and complete oral care, hand over hand to initate tasks then able to complete with supervision                      Toileting- Clothing Manipulation and Hygiene: Min guard;Sit to/from stand       Functional mobility during ADLs: Minimal assistance General ADL Comments: pt continues to be signficantly limited by cognition      Vision       Perception     Praxis      Cognition Arousal/Alertness: Awake/alert Behavior During Therapy: WFL for tasks assessed/performed;Flat affect Overall Cognitive Status: Impaired/Different from baseline Area of Impairment: Following commands;Problem solving                       Following Commands: Follows one step commands inconsistently;Follows one step commands with increased time     Problem Solving: Difficulty sequencing;Requires verbal cues;Requires tactile cues;Decreased initiation General Comments: pt able to follow simple commands inconsistently during session, pt able to follow visual and tactile cueing more consistently, then verbal cueing         Exercises     Shoulder Instructions  General Comments VSS     Pertinent Vitals/ Pain       Pain Assessment: Faces Faces Pain Scale: No hurt  Home Living                                          Prior Functioning/Environment              Frequency  Min 2X/week        Progress Toward Goals  OT Goals(current goals can now be found in the care plan section)  Progress towards OT goals: Progressing  toward goals  Acute Rehab OT Goals Patient Stated Goal: unable to state OT Goal Formulation: Patient unable to participate in goal setting Time For Goal Achievement: 09/26/18 Potential to Achieve Goals: Good  Plan Discharge plan remains appropriate;Frequency remains appropriate    Co-evaluation                 AM-PAC PT "6 Clicks" Daily Activity     Outcome Measure   Help from another person eating meals?: A Little Help from another person taking care of personal grooming?: A Lot Help from another person toileting, which includes using toliet, bedpan, or urinal?: A Little Help from another person bathing (including washing, rinsing, drying)?: A Lot Help from another person to put on and taking off regular upper body clothing?: A Little Help from another person to put on and taking off regular lower body clothing?: A Lot 6 Click Score: 15    End of Session Equipment Utilized During Treatment: Gait belt  OT Visit Diagnosis: Unsteadiness on feet (R26.81);Other symptoms and signs involving cognitive function;Cognitive communication deficit (R41.841) Symptoms and signs involving cognitive functions: Cerebral infarction   Activity Tolerance Patient tolerated treatment well   Patient Left in chair;with call bell/phone within reach;with chair alarm set;with family/visitor present   Nurse Communication Mobility status        Time: 1610-9604 OT Time Calculation (min): 20 min  Charges: OT General Charges $OT Visit: 1 Visit OT Treatments $Self Care/Home Management : 8-22 mins  Chancy Milroy, OT Acute Rehabilitation Services Pager [phone] Office [phone]    Chancy Milroy 09/13/2018, 3:03 PM

## 2018-09-13 NOTE — Plan of Care (Signed)
  Problem: Health Behavior/Discharge Planning: Goal: Ability to manage health-related needs will improve Outcome: Progressing   Problem: Clinical Measurements: Goal: Ability to maintain clinical measurements within normal limits will improve Outcome: Progressing   Problem: Coping: Goal: Level of anxiety will decrease Outcome: Progressing   

## 2018-09-13 NOTE — Progress Notes (Signed)
  Speech Language Pathology Treatment: Cognitive-Linquistic  Patient Details Name: Arthur [name] MRN: [id number] DOB: [birth year] Today's Date: 09/13/2018 Time: 0900-0920 SLP Time Calculation (min) (ACUTE ONLY): 20 min  Assessment / Plan / Recommendation Clinical Impression  Pt less intuitive in functional tasks today, receptive and expressive deficits still severe. Pt only verbalizing jargon, perseverating on "president." Attempted max verbal, tactile hand over hand cues to point to Yes/No on a board correctly on command, Pt could not reproduce act of pointing or repeat words. Attempted matching objects to words, pt did read the word "spoon" and was able to place objects under words with model, but without accuracy, focused primarily on far left word. Used MIT techniques with singing "take me out the ball game." Pt did fill in the word "crackerjack" but otherwise did not participate. At the end he verbalized "thank you," but did not seem to recognize the purpose of the session or any awareness that his language is incomprehensible. Continue to recommend CIR at d/c.   HPI HPI: 62 year old male with known atrial fibrillation and subtherapeutic INR presenting globally mute.  MRI shows Multifocal signal abnormality involving the left cerebral hemisphere, consistent with late subacute to chronic left MCA territory infarcts. Evidence for associated hemorrhage at the left caudate and lentiform nuclei. No visible hemorrhage within this region on prior CT.       SLP Plan  Continue with current plan of care       Recommendations                   Plan: Continue with current plan of care       GO               Harlon Ditty, MA CCC-SLP  Acute Rehabilitation Services Pager [phone] Office [phone]  Claudine Mouton 09/13/2018, 12:18 PM

## 2018-09-13 NOTE — Progress Notes (Signed)
STROKE TEAM PROGRESS NOTE      SUBJECTIVE (INTERVAL HISTORY) No one is   at the bedside.  He remains globally aphasic.no neurological changes. Therapies recommended inpatient rehabilitation    OBJECTIVE Vitals:   09/12/18 2311 09/13/18 0328 09/13/18 0837 09/13/18 1203  BP: 119/73 109/78 106/77 113/76  Pulse: (!) 59 62 (!) 55 (!) 48  Resp: 18 18 16 16   Temp: 98 F (36.7 C) 98.3 F (36.8 C) 98 F (36.7 C) 98.6 F (37 C)  TempSrc: Oral Oral Oral Oral  SpO2: 99% 99% 100% 100%    CBC:  Recent Labs  Lab 09/11/18 1218 09/11/18 1249  WBC 8.8  --   NEUTROABS 5.9  --   HGB 15.9 17.0  HCT 48.7 50.0  MCV 97.4  --   PLT 238  --     Basic Metabolic Panel:  Recent Labs  Lab 09/12/18 0603 09/13/18 0435  NA 136 132*  K 4.2 3.8  CL 104 103  CO2 22 22  GLUCOSE 95 98  BUN 16 11  CREATININE 1.32* 1.10  CALCIUM 9.0 8.7*    Lipid Panel:     Component Value Date/Time   CHOL 140 09/12/2018 0603   TRIG 85 09/12/2018 0603   HDL 23 (L) 09/12/2018 0603   CHOLHDL 6.1 09/12/2018 0603   VLDL 17 09/12/2018 0603   LDLCALC 100 (H) 09/12/2018 0603   LDLCALC 74 06/05/2018 0805   HgbA1c:  Lab Results  Component Value Date   HGBA1C 5.6 09/12/2018   Urine Drug Screen:     Component Value Date/Time   LABOPIA NONE DETECTED 09/11/2018 2150   COCAINSCRNUR NONE DETECTED 09/11/2018 2150   LABBENZ NONE DETECTED 09/11/2018 2150   AMPHETMU NONE DETECTED 09/11/2018 2150   THCU NONE DETECTED 09/11/2018 2150   LABBARB NONE DETECTED 09/11/2018 2150    Alcohol Level     Component Value Date/Time   ETH <10 09/11/2018 1218    IMAGING  Ct Angio Head W Or [name] Contrast Ct Angio Neck W Or [name] Contrast Ct Cerebral Perfusion W Contrast 09/11/2018 IMPRESSION:  1. Discordant ASPECTS and CT Perfusion findings suggesting a subacute left MCA infarct with pseudo-normalized perfusion.  2. Mild irregularity of left MCA M2 branches, most apparent on series 8 image 28, with No left MCA branch or  large vessel occlusion identified.  3. Mild for age calcified carotid atherosclerosis in the head and neck. Mild posterior circulation atherosclerosis. No atherosclerotic stenosis identified.  4. Incidental left subclavian venous stenosis.    Arthur [name] Contrast 09/11/2018 IMPRESSION:  1. Multifocal signal abnormality involving the left cerebral hemisphere, consistent with late subacute to chronic left MCA territory infarcts. Evidence for associated hemorrhage at the left caudate and lentiform nuclei. No visible hemorrhage within this region on prior CT. Short interval follow-up CT to ensure no evolving hemorrhage at this level suggested.  2. Additional signal abnormality at the right temporal and parietal convexities, likely related to chronic ischemia as well.  3. No other acute intracranial abnormality.    Ct Head Code Stroke [name] Contrast 09/11/2018 IMPRESSION:  1. Acute/subacute left ACA territory infarcts involving the left lentiform nucleus, left superior temporal gyrus, left postcentral gyrus, and left basal ganglia.  2. Scattered white matter disease is worse left than right.  3. Left mastoid effusion. No obstructing nasopharyngeal lesion is present.  4. ASPECTS is 6/10    US Renal 09/11/2018 IMPRESSION:  Normal renal ultrasound.    Dg Chest Port 1 View 09/11/2018 IMPRESSION:  No acute cardiopulmonary disease.     Transthoracic Echocardiogram - Left ventricle: The cavity size was normal. Wall thickness was   normal. Systolic function was normal. The estimated ejection  fraction was in the range of 55% to 60%. Images were inadequate for LV wall motion assessment. The study was not technically sufficient to allow evaluation of LV diastolic dysfunction due to atrial fibrillation.  EEG -suggestive of a focal left hemispheric cerebral dysfunction consistent with his known stroke in the setting of a mild generalized nonspecific cerebral dysfunction (encephalopathy).        PHYSICAL EXAM Blood pressure 113/76, pulse (!) 48, temperature 98.6 F (37 C), temperature source Oral, resp. rate 16, SpO2 100 %. Frail middle-aged Caucasian male currently not in distress. . Afebrile. Head is nontraumatic. Neck is supple without bruit.    Cardiac exam no murmur or gallop. Lungs are clear to auscultation. Distal pulses are well felt. Neurological Exam :  Awake and alert globally aphasic. nonfluent speech with tangential speech. Follows only occasional midline commands and gestures. Not able to name repeat. No paraphasias. No dysarthria. Extraocular moments are full range without nystagmus. Blinks to threat bilaterally. Face appears symmetric. Tongue midline. Motor system exam reveals no upper or lower extremity drift. Symmetric and equal strength in all 4 extremities. No focal weakness. Dependent for the symmetric. Plantars are downgoing. Gait not tested.     ASSESSMENT/PLAN Arthur. Gilbert Manolis is a 62 y.o. male with history of TIA, hypertension, bipolar disease, HIV, and atrial fibrillation on chronic Coumadin (admission INR 1.12) (Xarelto listed on home medication list) along with alcohol abuse presenting with right facial droop and global aphasia. He did not receive IV t-PA due to unknown time of onset.   Stroke:  multiple Lt sided subacute infarcts - embolic -left caudate head subacute hemorrhagic infarct likely from afib with sub therapeutic INR.  Resultant  Global aphasia  CT head - Acute/subacute left ACA territory infarcts involving the left lentiform nucleus, left superior temporal gyrus, left postcentral gyrus, and left basal ganglia.   MRI head - Multifocal signal abnormality involving the left cerebral hemisphere, consistent with late subacute to chronic left MCA territory infarcts. Evidence for associated hemorrhage at the left caudate and lentiform.  Additional signal abnormality at the right temporal and parietal convexities.  MRA head - not  performed  CTA H&N - suggesting a subacute left MCA infarct  Carotid Doppler - CTA neck performed - carotid dopplers not indicated.  2D Echo - normal EF  EEG - suggestive of a focal left hemispheric cerebral dysfunction consistent with his known stroke in the setting of a mild generalized nonspecific cerebral dysfunction (encephalopathy).    LDL - 100  HgbA1c - 5.6  VTE prophylaxis - SCDs  Diet - NPO  warfarin daily prior to admission, now on aspirin 300 mg suppository daily  Patient counseled to be compliant with his antithrombotic medications  Ongoing aggressive stroke risk factor management  Therapy recommendations:  pending  Disposition:  Pending  Hypertension  Stable . Permissive hypertension (OK if < 220/120) but gradually normalize in 5-7 days . Long-term BP goal normotensive  Hyperlipidemia  Lipid lowering medication PTA:  Crestor 10 mg daily  LDL 100, goal < 70  Current lipid lowering medication: Crestor 20 mg -> increase to 40 mg.  Continue statin at discharge    Other Stroke Risk Factors  Advanced age  ETOH use, advised to drink no more than 1 - 2 drink(s) a day  Obesity, There is  no height or weight on file to calculate BMI., recommend weight loss, diet and exercise as appropriate   Hx stroke/TIA by imaging  Family hx stroke (mother)   Other Active Problems  Creatinine - 1.86->1.70->1.32  Need to clarify anticoagulation PTA as well as compliance.    Hospital day # 2     He presented with an unclear onset of symptoms but clearly has a global aphasia and brain imaging study shows subacute left MCA subcortical and cortical infarcts including a small left caudate head hemorrhagic infarct. Etiology is likely embolism from atrial fibrillation on suboptimal anticoagulation.start Xarelto for anticoagulation and transferred to rehabilitation when bed available. Stroke team will sign off. Kindly call for questions. Discussed with Dr. Criss Alvine No  family is available at the bedside.   Delia Heady, MD Medical Director [facility] Pager: [phone] 09/13/2018 3:40 PM   To contact Stroke Continuity provider, please refer to [email or web address]. After hours, contact General Neurology
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
## 2018-09-13 NOTE — Consult Note (Signed)
Physical Medicine and Rehabilitation Consult Reason for Consult: Altered mental status with aphasia Referring Physician: hoffman   HPI: Arthur [name] is a 62 y.o. right-handed male with history of hypertension, HIV maintained on antiretrovirals, TIA, bipolar disorder, atrial fibrillation maintained on? Xarelto and alcohol abuse as well as questionable medical compliance.  Presented 09/11/2018 with altered mental status and aphasia.  Cranial CT scan showed acute subacute left ACA territory infarcts involving the left lentiform nucleus, left superior temporal gyrus, left postcentral gyrus and left basal ganglia.  Scattered white matter disease worse left than right.  CT angiogram showed no large vessel occlusion identified.  Patient did not receive TPA.  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  Urine drug screen and alcohol negative.  EEG completed 09/12/2018 showing left hemispheric cerebral dysfunction without seizure.  Hospital course monitoring of history of atrial fibrillation.  Patient currently maintained on aspirin for CVA prophylaxis.  Subcutaneous Lovenox for DVT prophylaxis.  Therapy evaluation completed with recommendations of physical medicine rehab consult.   Review of Systems  Unable to perform ROS: Language   Past Medical History:  Diagnosis Date  . Acute kidney injury (HCC) 09/11/2018  . Alcohol abuse 12/21/2016  . Atrial fibrillation (HCC)   . Bipolar disorder (HCC)   . CVA (cerebral vascular accident) (HCC) 09/11/2018  . Depression   . History of blood transfusion 12-12-1998  . History of radiation exposure 08/17/2017  . HIV (human immunodeficiency virus infection) (HCC)   . HTN (hypertension) 11/17/2016  . 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 Lactic acidosis 09/11/2018  . Late syphilis   . Lower back pain 11/25/2015  . Recurrent major  depression-severe (HCC) 06/20/2016  . Syncope due to orthostatic hypotension 10/04/2017  . TIA (transient ischemic attack) 11/17/2016   Past Surgical History:  Procedure Laterality Date  . broken right leg Right 2000  . GASTRIC BYPASS  2012   [facility]   Family History  Problem Relation Age of Onset  . Heart disease Mother 41       MI age 54.  Died age 76  . Stroke Mother   . Heart disease Father        Died age 59s MI  . Heart disease Sister        No details   Social History:  reports that he has never smoked. He has never used smokeless tobacco. He reports that he does not drink alcohol or use drugs. Allergies: No Known Allergies Medications Prior to Admission  Medication Sig Dispense Refill  . ARIPiprazole (ABILIFY) 15 MG tablet Take 15 mg by mouth daily.    [name] BIKTARVY 50-200-25 MG TABS tablet TAKE 1 TABLET BY MOUTH DAILY (Patient taking differently: Take 1 tablet by mouth daily. ) 30 tablet 0  . DULoxetine (CYMBALTA) 60 MG capsule TAKE 2 CAPSULES (120 MG) BY MOUTH DAILY (Patient taking differently: Take 120 mg by mouth daily. ) 60 capsule 3  . ferrous sulfate 325 (65 FE) MG EC tablet Take 325 mg by mouth daily.    . hydrOXYzine (ATARAX/VISTARIL) 50 MG tablet Take 50 mg by mouth at bedtime.    . mirtazapine (REMERON) 15 MG tablet TAKE 1 TABLET(15 MG) BY MOUTH AT BEDTIME FOR DEPRESSION OR SLEEP (Patient taking differently: Take 15 mg by mouth at bedtime. ) 30 tablet 5  . prazosin (MINIPRESS) 2 MG capsule Take 2 mg by mouth at bedtime.    [name]  rivaroxaban (XARELTO) 20 MG TABS tablet Take 1 tablet (20 mg total) by mouth daily with supper. [facility] 30 tablet 11  . rosuvastatin (CRESTOR) 10 MG tablet Take 1 tablet (10 mg total) by mouth at bedtime. For high cholesterol 30 tablet 0  . sildenafil (VIAGRA) 100 MG tablet Take 1 tablet (100 mg total) by mouth daily as needed for erectile dysfunction. 30 tablet 5  . sotalol (BETAPACE) 80 MG tablet Take 1 tablet (80 mg total) by mouth every 12  (twelve) hours. 60 tablet 5  . tamsulosin (FLOMAX) 0.4 MG CAPS capsule Take 1 capsule (0.4 mg total) by mouth 2 (two) times daily. For prostate health 60 capsule 11  . topiramate (TOPAMAX) 50 MG tablet Take 50 mg by mouth at bedtime.       Home: Home Living Family/patient expects to be discharged to:: Unsure Additional Comments: pt with significant expressive and receptive language difficulties with no family or caregivers present to provide any reliable information  Functional History: Prior Function Comments: Unsure - pt with significant expressive and receptive language difficulties with no family or caregivers present to provide any reliable information Functional Status:  Mobility: Bed Mobility Overal bed mobility: Needs Assistance Bed Mobility: Supine to Sit, Sit to Supine Supine to sit: Supervision Sit to supine: Supervision General bed mobility comments: increased time, supervision for safety Transfers Overall transfer level: Needs assistance Equipment used: None Transfers: Sit to/from Stand Sit to Stand: Min assist General transfer comment: min assist for safety and balance       ADL: ADL Overall ADL's : Needs assistance/impaired Grooming: Minimal assistance, Standing Grooming Details (indicate cue type and reason): min assist to sequence steps for washing hands at sink Upper Body Bathing: Minimal assistance, Sitting Lower Body Bathing: Moderate assistance, Sit to/from stand Upper Body Dressing : Moderate assistance, Sitting Lower Body Dressing: Moderate assistance, Sit to/from stand Toilet Transfer: Minimal assistance, Ambulation(simulated in room) Functional mobility during ADLs: Minimal assistance General ADL Comments: Pt completed bed mobility, short distance mobility in room with no AD, standing at sink to engage in grooming tasks.  ADLs difficult to assess due to inability to follow 1 step commands consistently.   Cognition: Cognition Overall Cognitive  Status: Impaired/Different from baseline Arousal/Alertness: Awake/alert Orientation Level: Oriented to person, Disoriented to place, Disoriented to time, Disoriented to situation Attention: Sustained Sustained Attention: Appears intact Awareness: Impaired Awareness Impairment: Intellectual impairment Problem Solving: Impaired Problem Solving Impairment: Verbal basic Cognition Arousal/Alertness: Awake/alert Behavior During Therapy: WFL for tasks assessed/performed, Flat affect Overall Cognitive Status: Impaired/Different from baseline Area of Impairment: Following commands, Problem solving Following Commands: Follows one step commands inconsistently, Follows one step commands with increased time Problem Solving: Difficulty sequencing, Requires verbal cues, Requires tactile cues General Comments: pt able to follow simple commands inconsistently during session, pt able to follow visual and tactile cueing more consistently, then verbal cueing  Difficult to assess due to: Impaired communication(aphasia (receptive and expressive) )  Blood pressure 109/78, pulse 62, temperature 98.3 F (36.8 C), temperature source Oral, resp. rate 18, SpO2 99 %. Physical Exam  Constitutional: No distress.  HENT:  Head: Normocephalic.  Eyes: Pupils are equal, round, and reactive to light.  Neck: Normal range of motion.  Cardiovascular: Normal rate.  Respiratory: Effort normal.  GI: Soft.  Neurological:  Patient is alert and aphasic.  He can participate with some basic social language.  He will provide some yes no head nods which are inconsistent.  He did not follow commands. Right hemiparesis.  Results for orders placed or performed during the hospital encounter of 09/11/18 (from the past 24 hour(s))  Hemoglobin A1c     Status: None   Collection Time: 09/12/18  6:03 AM  Result Value Ref Range   Hgb A1c MFr Bld 5.6 4.8 - 5.6 %   Mean Plasma Glucose 114 mg/dL  Lipid panel     Status: Abnormal    Collection Time: 09/12/18  6:03 AM  Result Value Ref Range   Cholesterol 140 0 - 200 mg/dL   Triglycerides 85 <409 mg/dL   HDL 23 (L) >81 mg/dL   Total CHOL/HDL Ratio 6.1 RATIO   VLDL 17 0 - 40 mg/dL   LDL Cholesterol 191 (H) 0 - 99 mg/dL  Basic metabolic panel     Status: Abnormal   Collection Time: 09/12/18  6:03 AM  Result Value Ref Range   Sodium 136 135 - 145 mmol/L   Potassium 4.2 3.5 - 5.1 mmol/L   Chloride 104 98 - 111 mmol/L   CO2 22 22 - 32 mmol/L   Glucose, Bld 95 70 - 99 mg/dL   BUN 16 8 - 23 mg/dL   Creatinine, Ser 4.78 (H) 0.61 - 1.24 mg/dL   Calcium 9.0 8.9 - 29.5 mg/dL   GFR calc non Af Amer 56 (L) >60 mL/min   GFR calc Af Amer >60 >60 mL/min   Anion gap 10 5 - 15   Ct Angio Head W Or Wo Contrast  Result Date: 09/11/2018 CLINICAL DATA:  62 year old male with aphasia and left MCA territory infarct changes on noncontrast head CT today. EXAM: CT ANGIOGRAPHY HEAD AND NECK CT PERFUSION BRAIN TECHNIQUE: Multidetector CT imaging of the head and neck was performed using the standard protocol during bolus administration of intravenous contrast. Multiplanar CT image reconstructions and MIPs were obtained to evaluate the vascular anatomy. Carotid stenosis measurements (when applicable) are obtained utilizing NASCET criteria, using the distal internal carotid diameter as the denominator. Multiphase CT imaging of the brain was performed following IV bolus contrast injection. Subsequent parametric perfusion maps were calculated using RAPID software. CONTRAST:  ISOVUE-370 IOPAMIDOL (ISOVUE-370) INJECTION 76% COMPARISON:  Head CT 1337 hours. Brain MRI and intracranial MRA 05/31/2016. FINDINGS: CT Brain Perfusion Findings: ASPECTS was 6 out of 10 at 1337 hours. No core infarct is detected by CBF (<30%) Volume: (0 milliliters) Perfusion (Tmax>6.0s) volume: 28 milliliters but with bilateral representation which is likely erroneously. M TT and T-max maps do indicate asymmetric abnormal  perfusion parameters in the left MCA territory. Mismatch Volume: Not applicable CTA NECK Skeleton: No acute osseous abnormality identified. Cervical spine degeneration. Upper chest: Negative lung apices and superior mediastinum. Other neck: Contrast reflux in the paravertebral venous system due to left medial subclavian vein stenosis. Otherwise negative. Aortic arch: 3 vessel arch configuration. No arch atherosclerosis or great vessel origin stenosis. Right carotid system: Mild calcified plaque at the right ICA bulb, no stenosis. Left carotid system: Mild calcified plaque at the medial left ICA bulb without stenosis. Vertebral arteries: No proximal subclavian artery or vertebral artery origin atherosclerosis or stenosis. Mildly dominant left vertebral artery. V2 segment tortuosity. No vertebral artery plaque or stenosis to the skull base. CTA HEAD Posterior circulation: Mild distal vertebral artery irregularity without stenosis. Patent PICA origins and vertebrobasilar junction. Mild basilar artery irregularity without stenosis. Patent SCA and PCA origins. Small left posterior communicating artery, the right is diminutive or absent. Minimal PCA branch irregularity without stenosis. Anterior circulation: Both ICA siphons are patent. Mild  left siphon calcified plaque without stenosis. On the right there is up to moderate calcified plaque maximal at the supraclinoid segment, but no convincing stenosis. Normal ophthalmic and left posterior communicating artery origins. Patent carotid termini. Normal MCA and ACA origins. Anterior communicating artery and bilateral ACA branches are within normal limits. Right MCA M1 segment, right MCA bifurcation, and right MCA branches are within normal limits. The left MCA M1 segment bifurcates early. No M1 or bifurcation irregularity or stenosis. There is mild irregularity in the left MCA M2 branches most apparent on series 8, image 28. But when compared to the 2017 intracranial MRA,  there is no left MCA branch occlusion identified. Venous sinuses: Not evaluated due to early contrast timing. Anatomic variants: Mildly dominant left vertebral artery. Review of the MIP images confirms the above findings IMPRESSION: 1. Discordant ASPECTS and CT Perfusion findings suggesting a subacute left MCA infarct with pseudo-normalized perfusion. 2. Mild irregularity of left MCA M2 branches, most apparent on series 8 image 28, with No left MCA branch or large vessel occlusion identified. 3. Mild for age calcified carotid atherosclerosis in the head and neck. Mild posterior circulation atherosclerosis. No atherosclerotic stenosis identified. 4. Incidental left subclavian venous stenosis. Electronically Signed   By: Odessa Fleming M.D.   On: 09/11/2018 14:10   Ct Head W & Wo Contrast  Result Date: 09/12/2018 CLINICAL DATA:  Aphasia.  Follow-up stroke EXAM: CT HEAD WITHOUT AND WITH CONTRAST TECHNIQUE: Contiguous axial images were obtained from the base of the skull through the vertex without and with intravenous contrast CONTRAST:  OMNIPAQUE IOHEXOL 300 MG/ML  SOLN COMPARISON:  Brain MRI from yesterday FINDINGS: Brain: There is ring enhancement around a isodense area at the left caudate head. Wispy enhancement more inferiorly along the putamen. This had the appearance of a lateral lenticulostriate infarct with nonacute hematoma at the caudate head on prior MRI. There is also superficial cortical enhancement in the superficial left temporal lobe, extending into the parietal lobe, also in the MCA distribution and most consistent with late subacute infarct. Remote superficial right temporal lobe infarct. Vascular: Atherosclerotic calcification Skull: Normal. Negative for fracture or focal lesion. Sinuses/Orbits: Partial left mastoid opacification. IMPRESSION: Findings of late subacute infarct in the left MCA territory affecting lateral lenticulostriate and the inferior division. Ring enhancement at the left  caudate head is best attributed to nonacute hematoma when correlated with MRI. Electronically Signed   By: Marnee Spring M.D.   On: 09/12/2018 10:23   Ct Angio Neck W Or Wo Contrast  Result Date: 09/11/2018 CLINICAL DATA:  62 year old male with aphasia and left MCA territory infarct changes on noncontrast head CT today. EXAM: CT ANGIOGRAPHY HEAD AND NECK CT PERFUSION BRAIN TECHNIQUE: Multidetector CT imaging of the head and neck was performed using the standard protocol during bolus administration of intravenous contrast. Multiplanar CT image reconstructions and MIPs were obtained to evaluate the vascular anatomy. Carotid stenosis measurements (when applicable) are obtained utilizing NASCET criteria, using the distal internal carotid diameter as the denominator. Multiphase CT imaging of the brain was performed following IV bolus contrast injection. Subsequent parametric perfusion maps were calculated using RAPID software. CONTRAST:  ISOVUE-370 IOPAMIDOL (ISOVUE-370) INJECTION 76% COMPARISON:  Head CT 1337 hours. Brain MRI and intracranial MRA 05/31/2016. FINDINGS: CT Brain Perfusion Findings: ASPECTS was 6 out of 10 at 1337 hours. No core infarct is detected by CBF (<30%) Volume: (0 milliliters) Perfusion (Tmax>6.0s) volume: 28 milliliters but with bilateral representation which is likely erroneously. M TT  and T-max maps do indicate asymmetric abnormal perfusion parameters in the left MCA territory. Mismatch Volume: Not applicable CTA NECK Skeleton: No acute osseous abnormality identified. Cervical spine degeneration. Upper chest: Negative lung apices and superior mediastinum. Other neck: Contrast reflux in the paravertebral venous system due to left medial subclavian vein stenosis. Otherwise negative. Aortic arch: 3 vessel arch configuration. No arch atherosclerosis or great vessel origin stenosis. Right carotid system: Mild calcified plaque at the right ICA bulb, no stenosis. Left carotid system: Mild  calcified plaque at the medial left ICA bulb without stenosis. Vertebral arteries: No proximal subclavian artery or vertebral artery origin atherosclerosis or stenosis. Mildly dominant left vertebral artery. V2 segment tortuosity. No vertebral artery plaque or stenosis to the skull base. CTA HEAD Posterior circulation: Mild distal vertebral artery irregularity without stenosis. Patent PICA origins and vertebrobasilar junction. Mild basilar artery irregularity without stenosis. Patent SCA and PCA origins. Small left posterior communicating artery, the right is diminutive or absent. Minimal PCA branch irregularity without stenosis. Anterior circulation: Both ICA siphons are patent. Mild left siphon calcified plaque without stenosis. On the right there is up to moderate calcified plaque maximal at the supraclinoid segment, but no convincing stenosis. Normal ophthalmic and left posterior communicating artery origins. Patent carotid termini. Normal MCA and ACA origins. Anterior communicating artery and bilateral ACA branches are within normal limits. Right MCA M1 segment, right MCA bifurcation, and right MCA branches are within normal limits. The left MCA M1 segment bifurcates early. No M1 or bifurcation irregularity or stenosis. There is mild irregularity in the left MCA M2 branches most apparent on series 8, image 28. But when compared to the 2017 intracranial MRA, there is no left MCA branch occlusion identified. Venous sinuses: Not evaluated due to early contrast timing. Anatomic variants: Mildly dominant left vertebral artery. Review of the MIP images confirms the above findings IMPRESSION: 1. Discordant ASPECTS and CT Perfusion findings suggesting a subacute left MCA infarct with pseudo-normalized perfusion. 2. Mild irregularity of left MCA M2 branches, most apparent on series 8 image 28, with No left MCA branch or large vessel occlusion identified. 3. Mild for age calcified carotid atherosclerosis in the head and  neck. Mild posterior circulation atherosclerosis. No atherosclerotic stenosis identified. 4. Incidental left subclavian venous stenosis. Electronically Signed   By: Odessa Fleming M.D.   On: 09/11/2018 14:10   Mr Brain Wo Contrast  Result Date: 09/11/2018 CLINICAL DATA:  Initial evaluation for global aphasia, stroke. EXAM: MRI HEAD WITHOUT CONTRAST TECHNIQUE: Multiplanar, multiecho pulse sequences of the brain and surrounding structures were obtained without intravenous contrast. COMPARISON:  Prior CTs from earlier the same day. FINDINGS: Brain: Examination moderately degraded by motion artifact. Scattered T2/FLAIR hyperintensity seen involving the periventricular and deep white matter of the left corona radiata, with involvement of the left periatrial white matter. Mild diffusion abnormality within these regions. Overlying scattered cortical FLAIR signal abnormality within the left frontoparietal and temporal lobes. Encephalomalacia at the anterior left temporal lobe. Findings consistent with late subacute to chronic left MCA territory infarcts. Additionally, there is involvement of the left basal ganglia. Associated susceptibility artifact with T1 hyperintensity at the left caudate and lentiform nucleus consistent with hemorrhage. No visible hemorrhage within this region on prior CT, suggesting at this is subacute or chronic in nature. Additional FLAIR signal abnormality at the right temporal and parietal convexities, also likely related to remote right MCA territory infarcts. No other evidence for acute ischemia. Underlying age-related cerebral atrophy. No mass lesion, midline shift, or  mass effect. No hydrocephalus. No extra-axial fluid collection. Pituitary gland within normal limits. Vascular: Major intracranial vascular flow voids maintained. Skull and upper cervical spine: Craniocervical junction normal. Upper cervical spine normal. Bone marrow signal intensity within normal limits. Scalp soft tissues  unremarkable. Sinuses/Orbits: Globes and orbital soft tissues within normal limits. Paranasal sinuses clear. Left mastoid effusion noted, likely benign. Other: None. IMPRESSION: 1. Multifocal signal abnormality involving the left cerebral hemisphere, consistent with late subacute to chronic left MCA territory infarcts. Evidence for associated hemorrhage at the left caudate and lentiform nuclei. No visible hemorrhage within this region on prior CT. Short interval follow-up CT to ensure no evolving hemorrhage at this level suggested. 2. Additional signal abnormality at the right temporal and parietal convexities, likely related to chronic ischemia as well. 3. No other acute intracranial abnormality. Electronically Signed   By: Rise Mu M.D.   On: 09/11/2018 20:18   US Renal  Result Date: 09/11/2018 CLINICAL DATA:  Acute renal failure. EXAM: RENAL / URINARY TRACT ULTRASOUND COMPLETE COMPARISON:  None. FINDINGS: Right Kidney: Length: 10 cm. Echogenicity within normal limits. No mass or hydronephrosis visualized. Left Kidney: Length: 10.8 cm. Echogenicity within normal limits. No mass or hydronephrosis visualized. Bladder: Appears normal for degree of bladder distention. IMPRESSION: Normal renal ultrasound. Electronically Signed   By: Amie Portland M.D.   On: 09/11/2018 18:58   Ct Cerebral Perfusion W Contrast  Result Date: 09/11/2018 CLINICAL DATA:  62 year old male with aphasia and left MCA territory infarct changes on noncontrast head CT today. EXAM: CT ANGIOGRAPHY HEAD AND NECK CT PERFUSION BRAIN TECHNIQUE: Multidetector CT imaging of the head and neck was performed using the standard protocol during bolus administration of intravenous contrast. Multiplanar CT image reconstructions and MIPs were obtained to evaluate the vascular anatomy. Carotid stenosis measurements (when applicable) are obtained utilizing NASCET criteria, using the distal internal carotid diameter as the denominator. Multiphase  CT imaging of the brain was performed following IV bolus contrast injection. Subsequent parametric perfusion maps were calculated using RAPID software. CONTRAST:  ISOVUE-370 IOPAMIDOL (ISOVUE-370) INJECTION 76% COMPARISON:  Head CT 1337 hours. Brain MRI and intracranial MRA 05/31/2016. FINDINGS: CT Brain Perfusion Findings: ASPECTS was 6 out of 10 at 1337 hours. No core infarct is detected by CBF (<30%) Volume: (0 milliliters) Perfusion (Tmax>6.0s) volume: 28 milliliters but with bilateral representation which is likely erroneously. M TT and T-max maps do indicate asymmetric abnormal perfusion parameters in the left MCA territory. Mismatch Volume: Not applicable CTA NECK Skeleton: No acute osseous abnormality identified. Cervical spine degeneration. Upper chest: Negative lung apices and superior mediastinum. Other neck: Contrast reflux in the paravertebral venous system due to left medial subclavian vein stenosis. Otherwise negative. Aortic arch: 3 vessel arch configuration. No arch atherosclerosis or great vessel origin stenosis. Right carotid system: Mild calcified plaque at the right ICA bulb, no stenosis. Left carotid system: Mild calcified plaque at the medial left ICA bulb without stenosis. Vertebral arteries: No proximal subclavian artery or vertebral artery origin atherosclerosis or stenosis. Mildly dominant left vertebral artery. V2 segment tortuosity. No vertebral artery plaque or stenosis to the skull base. CTA HEAD Posterior circulation: Mild distal vertebral artery irregularity without stenosis. Patent PICA origins and vertebrobasilar junction. Mild basilar artery irregularity without stenosis. Patent SCA and PCA origins. Small left posterior communicating artery, the right is diminutive or absent. Minimal PCA branch irregularity without stenosis. Anterior circulation: Both ICA siphons are patent. Mild left siphon calcified plaque without stenosis. On the right there is up  to moderate calcified  plaque maximal at the supraclinoid segment, but no convincing stenosis. Normal ophthalmic and left posterior communicating artery origins. Patent carotid termini. Normal MCA and ACA origins. Anterior communicating artery and bilateral ACA branches are within normal limits. Right MCA M1 segment, right MCA bifurcation, and right MCA branches are within normal limits. The left MCA M1 segment bifurcates early. No M1 or bifurcation irregularity or stenosis. There is mild irregularity in the left MCA M2 branches most apparent on series 8, image 28. But when compared to the 2017 intracranial MRA, there is no left MCA branch occlusion identified. Venous sinuses: Not evaluated due to early contrast timing. Anatomic variants: Mildly dominant left vertebral artery. Review of the MIP images confirms the above findings IMPRESSION: 1. Discordant ASPECTS and CT Perfusion findings suggesting a subacute left MCA infarct with pseudo-normalized perfusion. 2. Mild irregularity of left MCA M2 branches, most apparent on series 8 image 28, with No left MCA branch or large vessel occlusion identified. 3. Mild for age calcified carotid atherosclerosis in the head and neck. Mild posterior circulation atherosclerosis. No atherosclerotic stenosis identified. 4. Incidental left subclavian venous stenosis. Electronically Signed   By: Odessa Fleming M.D.   On: 09/11/2018 14:10   Dg Chest Port 1 View  Result Date: 09/11/2018 CLINICAL DATA:  Altered mental status. EXAM: PORTABLE CHEST 1 VIEW COMPARISON:  10/04/2017. FINDINGS: Mediastinum and hilar structures normal. Lungs are clear. No pleural effusion or pneumothorax. No acute bony abnormality. IMPRESSION: No acute cardiopulmonary disease. Electronically Signed   By: Maisie Fus  Register   On: 09/11/2018 13:16   Ct Head Code Stroke Wo Contrast  Result Date: 09/11/2018 CLINICAL DATA:  Code stroke. New onset aphasia. Last seen well unknown. EXAM: CT HEAD WITHOUT CONTRAST TECHNIQUE: Contiguous axial  images were obtained from the base of the skull through the vertex without intravenous contrast. COMPARISON:  MRI brain 05/31/2016 FINDINGS: Brain: A nonhemorrhagic acute/subacute left MCA territory infarct is evident. There is diffuse involvement of the left lentiform nucleus. There is patchy decreased attenuation in the left caudate head. Cortical density in the left insular cortex is less than on the right. A focal area of cortical hypoattenuation is present along the left postcentral gyrus on image 24. Moderate subcortical white matter changes are present bilaterally, left greater than right. The basal ganglia infarct involves the anterior limb of the left internal capsule. The infarct also involves the left superior temporal gyrus. No hemorrhage is present. Ventricles are proportionate to the degree of atrophy. No significant extra-axial fluid collection is present. Vascular: Atherosclerotic calcifications are present within the cavernous internal carotid arteries bilaterally. There is no significant asymmetric hyperdense artery. : Calvarium is intact. No focal lytic or blastic lesions are present. Extracranial soft tissues are within normal limits. Sinuses/Orbits: Fluid is present in the left mastoid air cells. The remaining paranasal sinuses and mastoid air cells are clear. Other: ASPECTS (Alberta Stroke Program Early CT Score) - Ganglionic level infarction (caudate, lentiform nuclei, internal capsule, insula, M1-M3 cortex): 4/7 - Supraganglionic infarction (M4-M6 cortex): 2/3 Total score (0-10 with 10 being normal): 6/10 IMPRESSION: 1. Acute/subacute left ACA territory infarcts involving the left lentiform nucleus, left superior temporal gyrus, left postcentral gyrus, and left basal ganglia. 2. Scattered white matter disease is worse left than right. 3. Left mastoid effusion. No obstructing nasopharyngeal lesion is present. 4. ASPECTS is 6/10 These results were called by telephone at the time of  interpretation on 09/11/2018 at 1:41 pm to Dr. Amada Jupiter, who verbally acknowledged these  results. Electronically Signed   By: Marin Roberts M.D.   On: 09/11/2018 13:42     Assessment/Plan: Diagnosis: left ACA/MCA infarcts 1. Does the need for close, 24 hr/day medical supervision in concert with the patient's rehab needs make it unreasonable for this patient to be served in a less intensive setting? Yes 2. Co-Morbidities requiring supervision/potential complications: ETOH abuse, afib, bipolar, HIV+ 3. Due to bladder management, bowel management, safety, skin/wound care, disease management, medication administration, pain management and patient education, does the patient require 24 hr/day rehab nursing? Yes 4. Does the patient require coordinated care of a physician, rehab nurse, PT (1-2 hrs/day, 5 days/week), OT (1-2 hrs/day, 5 days/week) and SLP (1-2 hrs/day, 5 days/week) to address physical and functional deficits in the context of the above medical diagnosis(es)? Yes Addressing deficits in the following areas: balance, endurance, locomotion, strength, transferring, bowel/bladder control, bathing, dressing, feeding, grooming, toileting, cognition, speech, language, swallowing and psychosocial support 5. Can the patient actively participate in an intensive therapy program of at least 3 hrs of therapy per day at least 5 days per week? Yes 6. The potential for patient to make measurable gains while on inpatient rehab is good 7. Anticipated functional outcomes upon discharge from inpatient rehab are supervision  with PT, supervision and min assist with OT, supervision and min assist with SLP. 8. Estimated rehab length of stay to reach the above functional goals is: 18-25 days 9. Anticipated D/C setting: Home 10. Anticipated post D/C treatments: HH therapy and Outpatient therapy 11. Overall Rehab/Functional Prognosis: excellent  RECOMMENDATIONS: This patient's condition is appropriate for  continued rehabilitative care in the following setting: CIR Patient has agreed to participate in recommended program. N/A Note that insurance prior authorization may be required for reimbursement for recommended care.  Comment: Will need assistance once discharged. Rehab Admissions Coordinator to follow up.  Thanks,  Ranelle Oyster, MD, [place] [facility]  I have personally performed a face to face diagnostic evaluation of this patient. Additionally, I have reviewed and concur with the physician assistant's documentation above.    Mcarthur Rossetti Angiulli, PA-C 09/13/2018

## 2018-09-13 NOTE — Progress Notes (Signed)
Inpatient Rehabilitation Admissions Coordinator  Met with patient at bedside. With receptive and expressive deficits. I have placed a call to his NOK on chart, [employer] I await call back to explore rehab venue options.  Danne Baxter, RN, MSN Rehab Admissions Coordinator [phone] 09/13/2018 1:27 PM

## 2018-09-13 NOTE — Progress Notes (Signed)
   Subjective: Arthur [name] continues to have expressive aphasia this morning. He is unable to answer any of my questions today.   Objective:  Vital signs in last 24 hours: Vitals:   09/13/18 0328 09/13/18 0837 09/13/18 1203 09/13/18 1628  BP: 109/78 106/77 113/76 105/75  Pulse: 62 (!) 55 (!) 48 (!) 50  Resp: 18 16 16 15   Temp: 98.3 F (36.8 C) 98 F (36.7 C) 98.6 F (37 C) 97.7 F (36.5 C)  TempSrc: Oral Oral Oral Oral  SpO2: 99% 100% 100% 99%   Physical Exam  Constitutional: He appears well-developed and well-nourished.  HENT:  Head: Normocephalic and atraumatic.  Eyes: EOM are normal.  Neck: Normal range of motion.  Cardiovascular: Normal rate, regular rhythm and normal heart sounds.  Pulmonary/Chest: Effort normal and breath sounds normal.  Abdominal: Soft. Bowel sounds are normal.  Musculoskeletal: Normal range of motion.  No LE edema bilaterally noted.  Neurological: He is alert.  Continues to have expressive aphasia today. He is unable to follow verbal commands.   Skin: Skin is warm and dry.  Psychiatric:  Unable to determine mood because of expressive aphasia.  Nursing note and vitals reviewed.   Assessment/Plan:  Principal Problem:   CVA (cerebral vascular accident) [facility]) Active Problems:   Persistent atrial fibrillation   Global aphasia   Bipolar 1 disorder (HCC)   Alcoholism (HCC)   Essential hypertension  Arthur [name] is a 62 year old male with known atrial fibrillation who presented with global aphasia and found to have left ACA/MCA territory infarct with bilateral white matter changes. EEG showed generalized encephalopathy consistent with stroke patterns on MRI. Did not show signs of seizure-activity.   Left ACA/MCA Infarct 1. Appreciate neurology consult 2. Continue ASA 325 mg PO 3. Restart home Xarelto 20 mg QD 2. Continue PT/OT/ST  PersistentA-fib with RVR: Resolved 1. Continue home sotalol 80 mg BID  Dispo: Anticipated discharge in  approximately 1-2 days to SNF.  Synetta Shadow, MD 09/13/2018, 5:15 PM Pager: [phone]

## 2018-09-13 NOTE — Progress Notes (Signed)
Inpatient Rehabilitation Admissions Coordinator  I received return call form pt's listed NOK, Arthur [name]. 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 Patient is estranged from his family and Arthur [name] is attempting to locate them to make them aware of patient's admission. I explained to Arthur [name] that Arthur [name] will need 24/7 supervision due to his expressive and receptive deficits. Inpt rehab will not be the most appropriate rehab venue for his long term recovery. I have updated RN CM and SW of need for placement. We will sign off at this time.  Ottie Glazier, RN, MSN Rehab Admissions Coordinator [phone] 09/13/2018 3:03 PM

## 2018-09-14 MED ORDER — ASPIRIN EC 81 MG PO TBEC
81.0000 mg | DELAYED_RELEASE_TABLET | Freq: Every day | ORAL | Status: DC
  Administered 2018-09-15 – 2018-09-27 (×13): 81 mg via ORAL
  Filled 2018-09-14 (×14): qty 1

## 2018-09-14 NOTE — Progress Notes (Addendum)
   Subjective: [name] says yes to all of my questions and does not seem to fully understand what I am saying. He continues to have both receptive and expressive aphasia.   Objective:  Vital signs in last 24 hours: Vitals:   09/13/18 1628 09/13/18 1938 09/13/18 2317 09/14/18 0307  BP: 105/75 110/73 113/67 122/68  Pulse: (!) 50 (!) 55 (!) 59 (!) 57  Resp: 15 18 18 18   Temp: 97.7 F (36.5 C) 98 F (36.7 C) 97.9 F (36.6 C) 98 F (36.7 C)  TempSrc: Oral Oral Oral Oral  SpO2: 99% 99% 99% 98%   Physical Exam  Constitutional: He appears well-developed and well-nourished.  HENT:  Head: Normocephalic and atraumatic.  Eyes: EOM are normal.  Neck: Normal range of motion.  Cardiovascular: Normal rate, regular rhythm and normal heart sounds.  Pulmonary/Chest: Effort normal and breath sounds normal.  Abdominal: Soft. Bowel sounds are normal.  Musculoskeletal: Normal range of motion.  No LE edema appreciated  Neurological: He is alert.  He continues to have both expressive and receptive aphasia. He answers all of my questions with a "yes" and does not follow commands.  Skin: Skin is warm and dry.  Psychiatric:  Unable to determine mood due to global aphasia.  Nursing note and vitals reviewed.   Assessment/Plan:  Principal Problem:   CVA (cerebral vascular accident) (HCC) Active Problems:   Persistent atrial fibrillation   AKI (acute kidney injury) (HCC)   Global aphasia   Bipolar 1 disorder (HCC)   Alcoholism (HCC)   Essential hypertension   Hyperlipidemia LDL goal <70   [name] is a 62 year old male with known atrial fibrillation who presented with global aphasia and found to have left ACA/MCA territory infarct with bilateral white matter changes. Etiology likely embolism from atrial fibrillation.  Per PMNR note, he is not a candidate for inpatient rehab and will likely need long-term recovery. I spoke with emergency contact Demetrios Isaacs who is the Librarian, academic of  [employer] about the patient's poor prognosis and need for long-term SNF placement. He stated that he will attempt to locate estranged family.  Left ACA/MCA Infarct 1. ContinueASA 325 mg PO 3. Continue home Xarelto 20 mg QD 2. Continue PT/OT/ST  PersistentA-fib with RVR: Resolved 1.Continue home sotalol 80 mg BID  Dispo: Anticipated discharge in approximately 1-2 days to SNF.  Synetta Shadow, MD 09/14/2018, 6:26 AM Pager: [phone]

## 2018-09-14 NOTE — Progress Notes (Signed)
Physical Therapy Treatment Patient Details Name: Arthur [name] MRN: [id number] DOB: 11/25/1956 Today's Date: 09/14/2018    History of Present Illness Pt is a 62 y/o male with PMH of HIV, alcohol abuse, HTN, bipolar disease, atrial fibrillation on chronic Xarelot, and history of TIA who presented expressive/receptive aphasia and intermittent confusion. Pt found to have multiple Lt sided subacute infarcts - embolic -left caudate head subacute hemorrhagic infarct likely from afib with sub therapeutic INR.    PT Comments    Patient seen for mobility progression. Pt requires assistance for safe OOB mobility due to impaired balance and R side inattention while navigating environment. Pt with LOB X2 and required increased assist to recover. Pt requires visual and tactile cues for completion of tasks due to continued receptive/expressive aphasia. Continue to recommend CIR for further skilled PT services to maximize independence and safety with mobility.    Follow Up Recommendations  CIR     Equipment Recommendations  None recommended by PT    Recommendations for Other Services       Precautions / Restrictions Precautions Precautions: Fall    Mobility  Bed Mobility Overal bed mobility: Needs Assistance Bed Mobility: Supine to Sit     Supine to sit: Supervision     General bed mobility comments: for safety  Transfers Overall transfer level: Needs assistance Equipment used: None Transfers: Sit to/from Stand Sit to Stand: Min guard         General transfer comment: min guard for safety  Ambulation/Gait Ambulation/Gait assistance: Min assist(mod A with LOB X2) Gait Distance (Feet): (hallway ambulation) Assistive device: (assist at trunk with use of gait belt) Gait Pattern/deviations: Step-through pattern;Decreased step length - right;Decreased step length - left;Decreased dorsiflexion - right;Drifts right/left Gait velocity: decreased   General Gait Details: multimodal cues  for navigating environment and for increased awareness to R side; pt running into objects on R side and turning into doorways prematurely and running into door frame on R side; pt with LOB X2 once with turning to R side and once with side stepping    Stairs   Stairs assistance: Min assist Stair Management: Two rails;Alternating pattern;Forwards   General stair comments: pt with exaggerated R hip flexion to clear R foot when ascending    Wheelchair Mobility    Modified Rankin (Stroke Patients Only)       Balance Overall balance assessment: Needs assistance Sitting-balance support: Feet supported Sitting balance-Leahy Scale: Good     Standing balance support: No upper extremity supported;During functional activity Standing balance-Leahy Scale: Poor               High level balance activites: Side stepping;Direction changes;Turns              Cognition Arousal/Alertness: Awake/alert Behavior During Therapy: WFL for tasks assessed/performed;Flat affect Overall Cognitive Status: Impaired/Different from baseline Area of Impairment: Following commands;Problem solving                       Following Commands: Follows one step commands inconsistently;Follows one step commands with increased time     Problem Solving: Difficulty sequencing;Requires verbal cues;Requires tactile cues General Comments: pt able to follow simple commands inconsistently with visual and tactile cues       Exercises      General Comments        Pertinent Vitals/Pain Pain Assessment: Faces Faces Pain Scale: No hurt    Home Living  Prior Function            PT Goals (current goals can now be found in the care plan section) Progress towards PT goals: Progressing toward goals    Frequency    Min 4X/week      PT Plan Current plan remains appropriate    Co-evaluation              AM-PAC PT "6 Clicks" Daily Activity  Outcome  Measure  Difficulty turning over in bed (including adjusting bedclothes, sheets and blankets)?: None Difficulty moving from lying on back to sitting on the side of the bed? : None Difficulty sitting down on and standing up from a chair with arms (e.g., wheelchair, bedside commode, etc,.)?: Unable Help needed moving to and from a bed to chair (including a wheelchair)?: A Little Help needed walking in hospital room?: A Little Help needed climbing 3-5 steps with a railing? : A Little 6 Click Score: 18    End of Session Equipment Utilized During Treatment: Gait belt Activity Tolerance: Patient tolerated treatment well Patient left: in chair;with call bell/phone within reach;with chair alarm set Nurse Communication: Mobility status PT Visit Diagnosis: Other abnormalities of gait and mobility (R26.89);Other symptoms and signs involving the nervous system (R29.898)     Time: 9604-5409 PT Time Calculation (min) (ACUTE ONLY): 19 min  Charges:  $Gait Training: 8-22 mins                     Erline Levine, PTA Acute Rehabilitation Services Pager: [phone] Office: [phone]     Carolynne Edouard 09/14/2018, 2:08 PM

## 2018-09-14 NOTE — Progress Notes (Signed)
CSW acknowledging that patient will need SNF placement. Barriers to discharge include: Patient has no insurance. Patient has no current decision maker at this time. Patient also is HIV Positive and medications are expensive. LOG placement for patient will be attempted, assessment to follow when complete.  MD aware of barriers to discharge. CSW to follow.  Blenda Nicely, [place] Clinical Social Worker [phone]

## 2018-09-15 NOTE — Progress Notes (Signed)
   Subjective: No acute events overnight. Due to global aphasia, Mr. Seufert continues to have difficulty answering my questions. He replied "yes" to everything I ask.   Objective:  Vital signs in last 24 hours: Vitals:   09/14/18 1511 09/14/18 1959 09/14/18 2348 09/15/18 0410  BP: 114/80 112/79 100/69 96/69  Pulse: (!) 48 (!) 49 (!) 48 62  Resp: 17 18 18 18   Temp: 98.3 F (36.8 C) 98.3 F (36.8 C) 98.3 F (36.8 C) 97.7 F (36.5 C)  TempSrc: Oral Oral Oral Oral  SpO2: 99% 98% 98% 96%   Physical Exam  Constitutional: He appears well-developed and well-nourished.  HENT:  Head: Normocephalic and atraumatic.  Eyes: EOM are normal.  Neck: Normal range of motion.  Cardiovascular: Normal rate, regular rhythm and normal heart sounds.  Pulmonary/Chest: Effort normal and breath sounds normal.  Musculoskeletal:  No LE edema noted  Neurological: He is alert.  He continues to have both expressive and receptive aphasia. He is unable to follow any of my commands.  Skin: Skin is warm and dry.  Psychiatric:  Unable to determine mood due to global aphasia.   Vitals reviewed.   Assessment/Plan:  Principal Problem:   CVA (cerebral vascular accident) (HCC) Active Problems:   Persistent atrial fibrillation   AKI (acute kidney injury) (HCC)   Global aphasia   Bipolar 1 disorder (HCC)   Alcoholism (HCC)   Essential hypertension   Hyperlipidemia LDL goal <70   Mr. Parthasarathy is a 62 year old male with known atrial fibrillation who presented with global aphasia and found to have left ACA/MCA territory infarct with bilateral white matter changes. Etiology likely embolism from atrial fibrillation. Social work is currently searching SNF placement as he will likely need long-term care.  Left ACA/MCA Infarct 1. ContinueASA 325 mg PO 3. Continue home Xarelto 20 mg QD 2. Continue PT/OT/ST  PersistentA-fib with BJY:[id number] 1.Continuehome sotalol 80 mg BID  Dispo: Anticipated discharge  in approximately2 days to SNF.  Synetta Shadow, MD 09/15/2018, 6:08 AM Pager: [phone]

## 2018-09-15 NOTE — Progress Notes (Signed)
I spoke to [name] about skilled nursing facility placement stating that he would benefit from 24 hour nursing care to help him recover. Although he could not fully express himself, he was able to say "So I go tomorrow, we ready." To which I responded that it would likely be Monday. I asked him if I could speak freely to his sisters, and he stated "yes." I spoke to his two sisters Paschal Dopp ([phone] former nurse) and Michelene Gardener [phone]) about his current condition and need for SNF placement. They drove 3 hours today and would likely be returning home after their visit. They stated that they have not spoken to their brother in over 2 years due to an unknown family issue. They believed that he had been living by himself or in low income housing prior to being admitted. They are unsure whether he has health insurance or was properly taking any of his home medications. They would like to be informed if his condition changes or if he gets placed in a SNF.

## 2018-09-16 ENCOUNTER — Other Ambulatory Visit: Payer: Self-pay

## 2018-09-16 NOTE — Progress Notes (Signed)
   Subjective: Arthur [name] continues to have expressive aphasia.  He is however able to answer a couple questions with appropriate one-word responses.  He states "no" having any acute complaints.  Objective:  Vital signs in last 24 hours: Vitals:   09/15/18 2100 09/15/18 2309 09/16/18 0312 09/16/18 0729  BP:  102/73 113/71 93/70  Pulse:  (!) 101 99   Resp:  20 20 20   Temp:  98 F (36.7 C) 98.1 F (36.7 C) 97.7 F (36.5 C)  TempSrc:  Oral Oral Axillary  SpO2:  98% 99% 99%  Weight: 92.8 kg     Height: 6' (1.829 m)      Physical Exam  Constitutional: He is well-developed, well-nourished, and in no distress.  HENT:  Head: Normocephalic and atraumatic.  Eyes: EOM are normal.  Neck: Normal range of motion.  Cardiovascular: Normal rate, regular rhythm and normal heart sounds.  Pulmonary/Chest: Effort normal and breath sounds normal.  Abdominal: Soft. Bowel sounds are normal.  Musculoskeletal:  No lower extremity edema or tenderness noted.  Neurological:  Arthur [name] is able to answer a couple questions with appropriate one-word responses.  When asked to lift his arm he does not do so.  He is able to mimic body movements such as raising his arm.  Skin: Skin is warm and dry.  Psychiatric:  Unable to determine affect her mood due to his aphasia.  Nursing note and vitals reviewed.   Assessment/Plan:  Principal Problem:   CVA (cerebral vascular accident) (HCC) Active Problems:   Persistent atrial fibrillation   AKI (acute kidney injury) (HCC)   Global aphasia   Bipolar 1 disorder (HCC)   Alcoholism (HCC)   Essential hypertension   Hyperlipidemia LDL goal <70  Arthur [name] is a 62 year old male with known atrial fibrillation who presented with global aphasia and found to have left ACA/MCA territory infarct with bilateral white matter changes.Etiology likely embolism from atrial fibrillation.Social work is currently searching SNF placement as he will likely need long-term  care.  Left ACA/MCA Infarct 1. ContinueASA 325 mg PO 3.Continuehome Xarelto 20 mg QD 2. Continue PT/OT/ST  PersistentA-fib with ZDG:[id number] 1.Continuehome sotalol 80 mg BID  Dispo: Anticipated discharge in approximately1-2 days to SNF.  Synetta Shadow, MD 09/16/2018, 11:20 AM Pager: [phone]

## 2018-09-17 ENCOUNTER — Encounter (HOSPITAL_COMMUNITY): Payer: Self-pay | Admitting: Internal Medicine

## 2018-09-17 NOTE — Progress Notes (Signed)
Physical Therapy Treatment Patient Details Name: Arthur [name] MRN: [id number] DOB: 05/15/1956 Today's Date: 09/17/2018    History of Present Illness Pt is a 62 y/o male with PMH of HIV, alcohol abuse, HTN, bipolar disease, atrial fibrillation on chronic Xarelot, and history of TIA who presented expressive/receptive aphasia and intermittent confusion. Pt found to have multiple Lt sided subacute infarcts - embolic -left caudate head subacute hemorrhagic infarct likely from afib with sub therapeutic INR.    PT Comments    Patient progressing slowly towards PT goals. Seems to be more vocal today stating automatic phrases appropriately ~50% of the time. Tolerated gait training and stair training with Min A for balance/safety. Pt with right inattention, which seems to be improving as pt not running into things as much today- walks close to right wall. Requires seated rest break. Does well following gestural cues/commands. Discharge recommendation updated to SNF as pt needing more assist at d/c. Denied by CIR. Will follow.   Follow Up Recommendations  SNF     Equipment Recommendations  None recommended by PT    Recommendations for Other Services       Precautions / Restrictions Precautions Precautions: Fall Restrictions Weight Bearing Restrictions: No    Mobility  Bed Mobility Overal bed mobility: Needs Assistance Bed Mobility: Supine to Sit;Sit to Supine     Supine to sit: Supervision;HOB elevated Sit to supine: Supervision;HOB elevated   General bed mobility comments: for safety  Transfers Overall transfer level: Needs assistance Equipment used: None Transfers: Sit to/from Stand Sit to Stand: Min guard         General transfer comment: min guard for safety  Ambulation/Gait Ambulation/Gait assistance: Min assist Gait Distance (Feet): (hallway ambulation) Assistive device: None Gait Pattern/deviations: Step-through pattern;Decreased step length - right;Decreased step  length - left;Decreased dorsiflexion - right;Drifts right/left Gait velocity: decreased   General Gait Details: multimodal cues for navigating environment and for increased awareness to Rt side; pt running into objects less on Rt side today. 1 seated rest break.   Stairs Stairs: Yes Stairs assistance: Min assist Stair Management: Two rails;Alternating pattern;Forwards Number of Stairs: 2(+3 steps x2 bouts) General stair comments: Slow to negotiate steps; compensating with increased right hip flexion to clear right foot onto step.   Wheelchair Mobility    Modified Rankin (Stroke Patients Only) Modified Rankin (Stroke Patients Only) Pre-Morbid Rankin Score: No symptoms Modified Rankin: Moderately severe disability     Balance Overall balance assessment: Needs assistance Sitting-balance support: Feet supported;No upper extremity supported Sitting balance-Leahy Scale: Good     Standing balance support: No upper extremity supported;During functional activity Standing balance-Leahy Scale: Fair               High level balance activites: Turns;Direction changes              Cognition Arousal/Alertness: Awake/alert Behavior During Therapy: WFL for tasks assessed/performed Overall Cognitive Status: Difficult to assess                         Following Commands: Follows one step commands inconsistently;Follows one step commands with increased time     Problem Solving: Requires verbal cues General Comments: Seems to be more vocal today- getting better with automatic phrases "hi" "thank you" "ready to go?"      Exercises      General Comments        Pertinent Vitals/Pain Pain Assessment: Faces Faces Pain Scale: No hurt    Home Living  Prior Function            PT Goals (current goals can now be found in the care plan section) Progress towards PT goals: Progressing toward goals    Frequency    Min  3X/week      PT Plan Discharge plan needs to be updated;Frequency needs to be updated    Co-evaluation              AM-PAC PT "6 Clicks" Daily Activity  Outcome Measure  Difficulty turning over in bed (including adjusting bedclothes, sheets and blankets)?: None Difficulty moving from lying on back to sitting on the side of the bed? : None Difficulty sitting down on and standing up from a chair with arms (e.g., wheelchair, bedside commode, etc,.)?: A Little Help needed moving to and from a bed to chair (including a wheelchair)?: A Little Help needed walking in hospital room?: A Little Help needed climbing 3-5 steps with a railing? : A Little 6 Click Score: 20    End of Session Equipment Utilized During Treatment: Gait belt Activity Tolerance: Patient tolerated treatment well Patient left: in bed;with call bell/phone within reach;with bed alarm set Nurse Communication: Mobility status PT Visit Diagnosis: Other abnormalities of gait and mobility (R26.89);Other symptoms and signs involving the nervous system (R29.898)     Time: 1035-1050 PT Time Calculation (min) (ACUTE ONLY): 15 min  Charges:  $Gait Training: 8-22 mins                     Mylo Red, PT, DPT Acute Rehabilitation Services Pager [phone] Office [phone]       Arthur [name] 09/17/2018, 11:17 AM

## 2018-09-17 NOTE — Progress Notes (Addendum)
   Subjective: Arthur [name] expressive aphasia does seem to have improved today. He is able to say "thank-you" and "your welcome" appropriately. He answers "I'm fine." When pointing to a pencil, he is unable to state the name. He denies any acute complaints.  Objective:  Vital signs in last 24 hours: Vitals:   09/16/18 1620 09/16/18 2016 09/17/18 0611 09/17/18 0744  BP: 93/67 100/78 101/79 96/70  Pulse: 64 (!) 58 85 66  Resp: 20  19 18   Temp: 98 F (36.7 C) 97.8 F (36.6 C) 97.9 F (36.6 C) 98.9 F (37.2 C)  TempSrc: Oral Oral Oral Oral  SpO2: 100% 98% 99% 99%  Weight:      Height:       Physical Exam  Constitutional: He appears well-developed and well-nourished.  HENT:  Head: Normocephalic and atraumatic.  Eyes: EOM are normal.  Neck: Normal range of motion.  Cardiovascular: Normal rate, regular rhythm and normal heart sounds.  Pulmonary/Chest: Effort normal and breath sounds normal.  Abdominal: Soft. Bowel sounds are normal.  Musculoskeletal:  No LE edema or tenderness noted bilaterally.   Neurological: He is alert.  He is unable to follow verbal commands but does say one word answers appropriately. Ex: He is able to say "thank-you" and "your welcome."  Skin: Skin is warm and dry.  Psychiatric: He has a normal mood and affect. His behavior is normal.  Nursing note and vitals reviewed.   Assessment/Plan:  Principal Problem:   CVA (cerebral vascular accident) (HCC) Active Problems:   Persistent atrial fibrillation   AKI (acute kidney injury) (HCC)   Global aphasia   Bipolar 1 disorder (HCC)   Alcoholism (HCC)   Essential hypertension   Hyperlipidemia LDL goal <70  Arthur [name] is a 62 year old male with known atrial fibrillation who presented with aphasia and found to have left ACA/MCA territory infarct with bilateral white matter changes.Etiology likely embolism from atrial fibrillation.Social work is currently searching SNF placement as he will likely need  long-term care.  Left ACA/MCA Infarct 1. ContinueASA 81 mg PO 3.Continuehome Xarelto 20 mg QD 2. Continue PT/OT/ST  PersistentA-fib with WJX:BJYNWGNF 1.Continuehome sotalol 80 mg BID  Dispo: Anticipated discharge in approximately1-2days to SNF.  Synetta Shadow, MD 09/17/2018, 9:29 AM Pager: [phone]

## 2018-09-18 NOTE — Discharge Summary (Addendum)
Name: Arthur [name] MRN: [id number] DOB: Jun 13, 1956 62 y.o. PCP: Beola Cord, MD  Date of Admission: 09/11/2018 12:05 PM Date of Discharge: 09/27/2018 Attending Physician: Gust Rung, DO  Discharge Diagnosis: 1.  Global aphasia secondary to a left ACA/MCA infarct 2.  Atrial fibrillation with RVR 3.  Acute kidney injury 4.  Lactic acidosis 5. History of alcoholism 6. HIV with undetectable viral load  Discharge Medications: Allergies as of 09/27/2018   No Known Allergies     Medication List    STOP taking these medications   hydrOXYzine 50 MG tablet Commonly known as:  ATARAX/VISTARIL   prazosin 2 MG capsule Commonly known as:  MINIPRESS   sildenafil 100 MG tablet Commonly known as:  VIAGRA     TAKE these medications   acetaminophen 325 MG tablet Commonly known as:  TYLENOL Take 2 tablets (650 mg total) by mouth every 6 (six) hours as needed for mild pain (or temp > 37.5 C (99.5 F)).   ARIPiprazole 15 MG tablet Commonly known as:  ABILIFY Take 15 mg by mouth daily.   aspirin 81 MG EC tablet Take 1 tablet (81 mg total) by mouth daily. Start taking on:  09/28/2018   bictegravir-emtricitabine-tenofovir AF 50-200-25 MG Tabs tablet Commonly known as:  BIKTARVY Take 1 tablet by mouth daily.   DULoxetine 60 MG capsule Commonly known as:  CYMBALTA TAKE 2 CAPSULES (120 MG) BY MOUTH DAILY What changed:    how much to take  how to take this  when to take this  additional instructions   ferrous sulfate 325 (65 FE) MG EC tablet Take 325 mg by mouth daily.   mirtazapine 15 MG tablet Commonly known as:  REMERON TAKE 1 TABLET(15 MG) BY MOUTH AT BEDTIME FOR DEPRESSION OR SLEEP What changed:  See the new instructions.   rivaroxaban 20 MG Tabs tablet Commonly known as:  XARELTO Take 1 tablet (20 mg total) by mouth daily with supper. [facility]   rosuvastatin 10 MG tablet Commonly known as:  CRESTOR Take 1 tablet (10 mg total) by mouth at  bedtime. For high cholesterol   senna-docusate 8.6-50 MG tablet Commonly known as:  Senokot-S Take 1 tablet by mouth 2 (two) times daily.   sotalol 80 MG tablet Commonly known as:  BETAPACE Take 1 tablet (80 mg total) by mouth every 12 (twelve) hours.   tamsulosin 0.4 MG Caps capsule Commonly known as:  FLOMAX Take 1 capsule (0.4 mg total) by mouth 2 (two) times daily. For prostate health   topiramate 50 MG tablet Commonly known as:  TOPAMAX Take 50 mg by mouth at bedtime.       Disposition and follow-up:   Arthur [name] was [facility] in St[facility] condition.  At the hospital follow up visit please address:  1.  His global aphasia to identify improvement.  He will need continued physical therapy, speech therapy, occupational therapy.  He will need to continue his aspirin 81 mg by mouth indefinitely.  He should also continue his Xarelto 20 mg and sotalol 80 mg tablets to decrease his risk of another embolic stroke.  2.  Arthur [name] has allowed [place] to update his sisters Ms. Paschal Dopp [phone] former nurse) and Ms. Michelene Gardener [phone]) about his medical problems.  Please continue to keep them informed.  3.  Labs / imaging needed at time of follow-up: None  4.  Pending labs/ test needing follow-up: None  Hospital Course by problem list: 1.  Global aphasia secondary to a left ACA/MCA infarct: Arthur [name] presented to the emergency department with global aphasia and intermittent confusion.  He was found at a bank where he was unable to communicate effectively with a bank teller.  On head CT he was found to have an acute/subacute left ACA territory infarcts and scattered white matter changes.  His stroke was likely secondary to uncontrolled atrial fibrillation and subsequent embolic episode.  MRI showed similar infarcts with evidence of an associated hemorrhage of the left caudate and lentiform nuclei.  Neurology was consulted and recommended a  repeat CT which showed a subacute hemorrhage likely to be more than 33 week old.  Given the low likelihood of an active bleed he was restarted on his home Xarelto to decrease his risk of another embolic stroke.  He was also placed on aspirin 325 mg tablets QD and decreased to 81 mg which he will continue indefinitely.  An EEG was performed and showed generalized encephalopathy consistent with his acute stroke.  There were no signs of active seizure activity.  Throughout his admission his global aphasia did improve and he was able to express himself with one-word appropriate answers.  He was discharged to a skilled nursing facility in stable condition.  2.  A. fib with RVR: On arrival to the emergency department he was found to have atrial fibrillation with RVR with normal vital signs.  He spontaneously converted.  He was placed on telemetry and monitored.  After he was transferred to the unit he was again found to again be in atrial fibrillation with RVR.  His home sotalol 80 mg twice daily dose was restarted.  He converted back into normal sinus rhythm and remained without signs atrial fibrillation throughout the rest of his admission.   3.  Acute kidney injury: On presentation he was found to have a creatinine of 1.85 from baseline of 1.04.  We ordered a renal ultrasound which did not show any acute abnormalities.  A bladder scan was performed and did not show retention of urine.  He was given 1 L of IV fluids and continued on maintenance IV fluids.  His creatinine normalized within 2 days of being admitted and his IV fluids were stopped.  4.  Lactic acidosis: He was initially found to have lactic acidosis of 6.88 on presentation.  It is not entirely clear of why he had this lactic acidosis but perhaps it was due to his acute stroke.  On repeat his lactic acid had decreased to 1.6 and essentially normalized.  5. History of alcoholism: Arthur [name] has a history of alcoholism and we were unable to determine  whether he had continued drinking alcohol in the last several days.  He was placed on CIWA protocol without medication.  He did not develop acute withdrawal throughout his admission.  CIWA protocol was discontinued prior to discharge.  6. HIV: He is continued on his home dose of Biktarvy and was found to have an undetectable viral load during admission.  He will need to continue this post discharge.  Discharge Vitals:   BP (!) 136/97 (BP Location: Left Arm)   Pulse (!) 56   Temp 98.3 F (36.8 C) (Oral)   Resp 16   Ht 6' (1.829 m)   Wt 92.8 kg   SpO2 100%   BMI 27.75 kg/m   Pertinent Labs, Studies, and Procedures:  10/1 CXR: FINDINGS: Mediastinum and hilar structures normal. Lungs are clear. No pleural effusion or pneumothorax. No acute bony abnormality.  IMPRESSION: No acute cardiopulmonary disease.  10/1 Head CT: IMPRESSION: 1. Acute/subacute left ACA territory infarcts involving the left lentiform nucleus, left superior temporal gyrus, left postcentral gyrus, and left basal ganglia. 2. Scattered white matter disease is worse left than right. 3. Left mastoid effusion. No obstructing nasopharyngeal lesion is present. 4. ASPECTS is 6/10  10/1 Brain MRI: IMPRESSION: 1. Multifocal signal abnormality involving the left cerebral hemisphere, consistent with late subacute to chronic left MCA territory infarcts. Evidence for associated hemorrhage at the left caudate and lentiform nuclei. No visible hemorrhage within this region on prior CT. Short interval follow-up CT to ensure no evolving hemorrhage at this level suggested. 2. Additional signal abnormality at the right temporal and parietal convexities, likely related to chronic ischemia as well. 3. No other acute intracranial abnormality.  10/1 CT Angio Head/Neck: 1. Discordant ASPECTS and CT Perfusion findings suggesting a subacute left MCA infarct with pseudo-normalized perfusion. 2. Mild irregularity of left MCA M2  branches, most apparent on series 8 image 28, with No left MCA branch or large vessel occlusion identified. 3. Mild for age calcified carotid atherosclerosis in the head and neck. Mild posterior circulation atherosclerosis. No atherosclerotic stenosis identified. 4. Incidental left subclavian venous stenosis.  10/1 Renal US: FINDINGS: Right Kidney: Length: 10 cm. Echogenicity within normal limits. No mass or hydronephrosis visualized. Left Kidney: Length: 10.8 cm. Echogenicity within normal limits. No mass or hydronephrosis visualized. Bladder: Appears normal for degree of bladder distention. IMPRESSION: Normal renal ultrasound.  10/2 Head CT:  IMPRESSION: Findings of late subacute infarct in the left MCA territory affecting lateral lenticulostriate and the inferior division. Ring enhancement at the left caudate head is best attributed to nonacute hematoma when correlated with MRI.  CBC Latest Ref Rng & Units 09/11/2018 09/11/2018 06/05/2018  WBC 4.0 - 10.5 K/uL - 8.8 5.4  Hemoglobin 13.0 - 17.0 g/dL 21.3 08.6 57.8  Hematocrit 39.0 - 52.0 % 50.0 48.7 39.6  Platelets 150 - 400 K/uL - 238 191    CBC Latest Ref Rng & Units 09/11/2018 09/11/2018 06/05/2018  WBC 4.0 - 10.5 K/uL - 8.8 5.4  Hemoglobin 13.0 - 17.0 g/dL 46.9 62.9 52.8  Hematocrit 39.0 - 52.0 % 50.0 48.7 39.6  Platelets 150 - 400 K/uL - 238 191   BMP Latest Ref Rng & Units 09/13/2018 09/12/2018 09/11/2018  Glucose 70 - 99 mg/dL 98 95 413(K)  BUN 8 - 23 mg/dL 11 16 23   Creatinine 0.61 - 1.24 mg/dL 4.40 1.02(V) 2.53(G)  BUN/Creat Ratio 6 - 22 (calc) - - -  Sodium 135 - 145 mmol/L 132(L) 136 135  Potassium 3.5 - 5.1 mmol/L 3.8 4.2 4.4  Chloride 98 - 111 mmol/L 103 104 101  CO2 22 - 32 mmol/L 22 22 -  Calcium 8.9 - 10.3 mg/dL 6.4(Q) 9.0 -   Lab Results  Component Value Date   HIV1RNAQUANT <20 09/11/2018   Lab Results  Component Value Date   CD4TABS 790 09/11/2018   CD4TABS 1,020 06/05/2018   CD4TABS 870 12/07/2017     Signed: Synetta Shadow, MD 09/18/2018, 8:02 AM   Pager: [phone]

## 2018-09-18 NOTE — Progress Notes (Signed)
   Subjective: Arthur [name] continues to have expressive aphasia which does seem to be improving.  He is able to answer questions in several word responses appropriately.  He denies any acute complaints but review of systems is difficult to determine because of his aphasia.  Objective:  Vital signs in last 24 hours: Vitals:   09/17/18 2012 09/17/18 2017 09/17/18 2315 09/18/18 0324  BP: 110/73 (!) 149/88 (!) 144/79 118/77  Pulse: (!) 58 60 66 61  Resp: 18 18 20 20   Temp: 98.6 F (37 C) 98.2 F (36.8 C) 98.6 F (37 C) 98.5 F (36.9 C)  TempSrc: Oral Oral Oral Oral  SpO2: 100% 98% 98% 96%  Weight:      Height:       Physical Exam  Constitutional: He appears well-developed and well-nourished.  HENT:  Head: Normocephalic and atraumatic.  Eyes: EOM are normal.  Neck: Normal range of motion.  Cardiovascular: Normal rate, regular rhythm and normal heart sounds.  Pulmonary/Chest: Effort normal and breath sounds normal.  Abdominal: Soft. Bowel sounds are normal.  Musculoskeletal:  No lower extremity edema or tenderness noted on exam.  Neurological: He is alert.  He continues to have expressive aphasia.  He is able to answer my questions in several words appropriately.  He is unable to follow verbal commands.  Skin: Skin is warm and dry.  Psychiatric:  Unable to determine mood and affect because of continued aphasia.  Nursing note and vitals reviewed.   Assessment/Plan:  Principal Problem:   CVA (cerebral vascular accident) [facility]) Active Problems:   Persistent atrial fibrillation   Global aphasia   Bipolar 1 disorder (HCC)   Alcoholism (HCC)   Essential hypertension   Hyperlipidemia LDL goal <70  Arthur [name] is a 62 year old male with known atrial fibrillation who presented with aphasia and found to have left ACA/MCA territory infarct with bilateral white matter changes.Etiology likely embolism from atrial fibrillation. I spoke with social work yesterday who noted that it would  take several days for Arthur [name] to find a SNF placement given that he is uninsured.  We will continue to monitor his status while he waits for placement.   Left ACA/MCA Infarct 1. ContinueASA 81 mg PO 2.Continuehome Xarelto 20 mg QD 3. Continue rosuvastatin 40 mg QD 4. Continue PT/OT/ST  A-fib:Currently in NSR 1.Continuehome sotalol 80 mg BID  Dispo: Anticipated discharge in approximately 2-3 days to SNF.   Arthur Shadow, MD 09/18/2018, 6:39 AM Pager: [phone]

## 2018-09-18 NOTE — Care Management Note (Signed)
Case Management Note  Patient Details  Name: Arthur [name] MRN: [id number] Date of Birth: 22-Oct-1956  Subjective/Objective:   Pt admitted with CVA. He is from home alone. Pt IADL prior to admission. Pt without insurance. Pt has been very aphasic and supposedly estranged from his family.                 Action/Plan: On 09/17/2018 CM met with the patient, his sister and a friend from [employer]. Pt was able to nod yes and no to answers he was asked. CM asked if he was ok with his sister participating in decision making for rehab and he nodded yes.  Per his friend patient may live in [address] 8 apartment. (friend to try and find out this information). CM went over with them that the patient doesn't have insurance and Medicaid would need to be applied for so he can get some rehab before returning home. All voiced understanding (pt nodded). CM informed financial counseling that family available.  Plan is for SNF rehab under an LOG from the hospital until patients medicaid begins. CM following.   Expected Discharge Date:  09/19/18               Expected Discharge Plan:  Skilled Nursing Facility  In-House Referral:  Clinical Social Work  Discharge planning Services  CM Consult  Post Acute Care Choice:    Choice offered to:     DME Arranged:    DME Agency:     HH Arranged:    [place] Agency:     Status of Service:  In process, will continue to follow  If discussed at Long Length of Stay Meetings, dates discussed:    Additional Comments:  Pollie Friar, RN 09/18/2018, 3:31 PM

## 2018-09-18 NOTE — Progress Notes (Signed)
  Speech Language Pathology Treatment: Cognitive-Linquistic  Patient Details Name: Arthur [name] MRN: [id number] DOB: [birth year] Today's Date: 09/18/2018 Time: 0920-0940 SLP Time Calculation (min) (ACUTE ONLY): 20 min  Assessment / Plan / Recommendation Clinical Impression  Pt seen to target symbolic language in comprehension and expression. He is still quite perseverative on the word president, but more responsive to a variety of cueing methods. In automatic speech tasks pt was about 75% appropriate (name, greeting, Y/N questions about wants and needs) but often responded "I dont know". He was able to count to three with max visual and verbal cues. He had no comprehension of reading single words. Could not match objects to words, but could match objects to line drawings under words with 100% accuracy and started to approximate reading written words with severe phonemic paraphasias, somewhat stimulable today for articulatory cues when eye contact was good. Would be good to take him out of the room and work on functional language in a different setting with opportunities to make requests.    HPI HPI: 62 year old male with known atrial fibrillation and subtherapeutic INR presenting globally mute.  MRI shows Multifocal signal abnormality involving the left cerebral hemisphere, consistent with late subacute to chronic left MCA territory infarcts. Evidence for associated hemorrhage at the left caudate and lentiform nuclei. No visible hemorrhage within this region on prior CT.       SLP Plan  Continue with current plan of care       Recommendations                   Oral Care Recommendations: Oral care BID Follow up Recommendations: None SLP Visit Diagnosis: Aphasia (R47.01) Plan: Continue with current plan of care       GO                Sumayya Muha, Riley Nearing 09/18/2018, 11:04 AM

## 2018-09-18 NOTE — Progress Notes (Signed)
Occupational Therapy Treatment Patient Details Name: Arthur [name] MRN: [id number] DOB: [birth year] Today's Date: 09/18/2018    History of present illness Pt is a 62 y/o male with PMH of HIV, alcohol abuse, HTN, bipolar disease, atrial fibrillation on chronic Xarelot, and history of TIA who presented expressive/receptive aphasia and intermittent confusion. Pt found to have multiple Lt sided subacute infarcts - embolic -left caudate head subacute hemorrhagic infarct likely from afib with sub therapeutic INR.   OT comments  Patient willing to participate in OT session.  Patient presenting with increased verbalizations today, although continues to be significantly limited by global aphasia.  He is able to complete hand washing at sink with minimal assistance after hand over hand support to initiate task, and wash face with min guard assistance.  Completed mobility with min guard.  Fatigues easily.  DC recommendation updated to SNF as pt denied by CIR.  Will continue to follow.    Follow Up Recommendations  SNF;Supervision/Assistance - 24 hour    Equipment Recommendations  Other (comment)(TBD at next venue of care)    Recommendations for Other Services      Precautions / Restrictions Precautions Precautions: Fall Restrictions Weight Bearing Restrictions: No       Mobility Bed Mobility Overal bed mobility: Needs Assistance Bed Mobility: Supine to Sit;Sit to Supine     Supine to sit: Supervision;HOB elevated Sit to supine: Supervision;HOB elevated   General bed mobility comments: for safety  Transfers Overall transfer level: Needs assistance Equipment used: None Transfers: Sit to/from Stand Sit to Stand: Min guard         General transfer comment: min guard for safety    Balance Overall balance assessment: Needs assistance Sitting-balance support: Feet supported;No upper extremity supported Sitting balance-Leahy Scale: Good     Standing balance support: No upper  extremity supported;During functional activity Standing balance-Leahy Scale: Fair Standing balance comment: min guard for safety                           ADL either performed or assessed with clinical judgement   ADL Overall ADL's : Needs assistance/impaired     Grooming: Minimal assistance;Standing;Wash/dry hands;Wash/dry face Grooming Details (indicate cue type and reason): min assist to sequence steps for washing hands at sink, therapist initating getting soap and rubbing hands then pt able to complete tasks              Lower Body Dressing: Minimal assistance;Sit to/from stand Lower Body Dressing Details (indicate cue type and reason): donning/doffing socks with supervision seated, min assist in standing  Toilet Transfer: Min guard;Ambulation(simulated in room )           Functional mobility during ADLs: Min guard       Vision   Additional Comments: continues to be difficult to assess d/t cognition   Perception     Praxis      Cognition Arousal/Alertness: Awake/alert Behavior During Therapy: WFL for tasks assessed/performed Overall Cognitive Status: Difficult to assess Area of Impairment: Following commands;Problem solving                       Following Commands: Follows one step commands inconsistently;Follows one step commands with increased time     Problem Solving: Requires verbal cues;Difficulty sequencing;Requires tactile cues General Comments: pt more vocal today, continues to present with global aphasia        Exercises     Shoulder Instructions  General Comments      Pertinent Vitals/ Pain       Pain Assessment: Faces Faces Pain Scale: No hurt  Home Living                                          Prior Functioning/Environment              Frequency  Min 2X/week        Progress Toward Goals  OT Goals(current goals can now be found in the care plan section)  Progress towards OT  goals: Progressing toward goals  Acute Rehab OT Goals Patient Stated Goal: unable to state OT Goal Formulation: Patient unable to participate in goal setting Time For Goal Achievement: 09/26/18 Potential to Achieve Goals: Good  Plan Frequency remains appropriate;Discharge plan needs to be updated    Co-evaluation                 AM-PAC PT "6 Clicks" Daily Activity     Outcome Measure   Help from another person eating meals?: None Help from another person taking care of personal grooming?: A Little Help from another person toileting, which includes using toliet, bedpan, or urinal?: A Little Help from another person bathing (including washing, rinsing, drying)?: A Lot Help from another person to put on and taking off regular upper body clothing?: A Little Help from another person to put on and taking off regular lower body clothing?: A Little 6 Click Score: 18    End of Session Equipment Utilized During Treatment: Gait belt  OT Visit Diagnosis: Unsteadiness on feet (R26.81);Other symptoms and signs involving cognitive function;Cognitive communication deficit (R41.841) Symptoms and signs involving cognitive functions: Cerebral infarction   Activity Tolerance Patient tolerated treatment well   Patient Left with call bell/phone within reach;in bed;with bed alarm set   Nurse Communication Mobility status        Time: 1610-9604 OT Time Calculation (min): 13 min  Charges: OT General Charges $OT Visit: 1 Visit OT Treatments $Self Care/Home Management : 8-22 mins  Chancy Milroy, OT Acute Rehabilitation Services Pager [phone] Office [phone]    Chancy Milroy 09/18/2018, 4:55 PM

## 2018-09-19 NOTE — Progress Notes (Signed)
  Speech Language Pathology Treatment: Cognitive-Linquistic  Patient Details Name: Arthur [name] MRN: [id number] DOB: 04-07-1956 Today's Date: 09/19/2018 Time: 6962-9528 SLP Time Calculation (min) (ACUTE ONLY): 37 min  Assessment / Plan / Recommendation Clinical Impression  Pt continues to present with severe fluent expressive aphasia and impaired comprehension. Given mod-max verbal and visual cues, pt matched one out of two written words to the associated object. Slightly improved ability to imitate speech noted with exaggerated articulatory cues; he repeated his full name, pen, straw, coke. Pt also demonstrated slightly improved reading ability; [birth year] (his birthday[birth year] banana. Perseveration of "the president" and "vice president" persisted throughout session, to which SLP provided corrective feedback. Pt safely ambulated to nursing station, was appropriate with greeting and when prompted by clinician, he requested "something to drink." He also followed commands to "wait here" and "turn around." Upon return to his room, he wrote his name independently. Pt will benefit from continued ST focused on comprehension and intentional written and verbal expression.   HPI HPI: 62 year old male with known atrial fibrillation and subtherapeutic INR presenting globally mute.  MRI shows Multifocal signal abnormality involving the left cerebral hemisphere, consistent with late subacute to chronic left MCA territory infarcts. Evidence for associated hemorrhage at the left caudate and lentiform nuclei. No visible hemorrhage within this region on prior CT.       SLP Plan  Continue with current plan of care       Recommendations                   Oral Care Recommendations: Oral care BID Follow up Recommendations: Skilled Nursing facility SLP Visit Diagnosis: Aphasia (R47.01) Plan: Continue with current plan of care       Suzzette Righter, Student SLP                     Suzzette Righter 09/19/2018, 12:05 PM

## 2018-09-19 NOTE — Clinical Social Work Note (Signed)
Clinical Social Work Assessment  Patient Details  Name: Arthur [name] MRN: [id number] Date of Birth: 13-Sep-1956  Date of referral:  09/19/18               Reason for consult:  Facility Placement                Permission sought to share information with:  Facility Medical sales representative, Family Supports Permission granted to share information::  Yes, Verbal Permission Granted  Name::     Eliott Nine  Agency::  SNF  Relationship::  Friend, Sisters  Contact Information:     Housing/Transportation Living arrangements for the past 2 months:  Actor of Information:  Development worker, community, Siblings, Engineer, mining Patient Interpreter Needed:  None Criminal Activity/Legal Involvement Pertinent to Current Situation/Hospitalization:  No - Comment as needed Significant Relationships:  Friend, Siblings Lives with:  Self Do you feel safe going back to the place where you live?  Yes Need for family participation in patient care:  Yes (Comment)  Care giving concerns:  Patient from home alone, but will need short term rehab at discharge.   Social Worker assessment / plan:  CSW alerted by medical team that patient will need SNF placement. Patient has no insurance, so he will be LOG placement. Patient is aphasic at this time, agreeable for patient's sister to assist with making decisions for him. Patient will need Medicaid application filed prior to LOG placement. CSW coordinating with Shanda Bumps with Financial Counseling to assist in patient's Medicaid application. CSW has completed referral and faxed out.  Employment status:  Disabled (Comment on whether or not currently receiving Disability) Insurance information:  Self Pay (Medicaid Pending) PT Recommendations:  Inpatient Rehab Consult Information / Referral to community resources:  Skilled Nursing Facility  Patient/Family's Response to care:  Patient agreeable to SNF placement, and agreeable for patient's sisters to assist in making  decisions for him.  Patient/Family's Understanding of and Emotional Response to Diagnosis, Current Treatment, and Prognosis:  Patient's understanding at this time can't be fully assessed due to aphasia, but he nods appropriately when asked questions. Patient seems to be aware that he will need additional support at discharge, and had been independent prior to admission.   Emotional Assessment Appearance:  Appears stated age Attitude/Demeanor/Rapport:  Unable to Assess Affect (typically observed):  Unable to Assess Orientation:  Oriented to Self Alcohol / Substance use:  Not Applicable Psych involvement (Current and /or in the community):  No (Comment)  Discharge Needs  Concerns to be addressed:  Care Coordination, Lack of Support, Financial / Insurance Concerns Readmission within the last 30 days:  No Current discharge risk:  Physical Impairment, Dependent with Mobility, Lives alone Barriers to Discharge:  Continued Medical Work up, Inadequate or no insurance, Family Issues   Baldemar Lenis, [place] 09/19/2018, 3:20 PM

## 2018-09-19 NOTE — Progress Notes (Signed)
Physical Therapy Treatment Patient Details Name: Arthur [name] MRN: [id number] DOB: 1956-01-15 Today's Date: 09/19/2018    History of Present Illness Pt is a 62 y/o male with PMH of HIV, alcohol abuse, HTN, bipolar disease, atrial fibrillation on chronic Xarelot, and history of TIA who presented expressive/receptive aphasia and intermittent confusion. Pt found to have multiple Lt sided subacute infarcts - embolic -left caudate head subacute hemorrhagic infarct likely from afib with sub therapeutic INR.    PT Comments    Pt continues to make slow progress with functional mobility; however, remains limited secondary to cognitive deficits (see below) and poor safety awareness. He responds verbally with automated phrases that are only appropriate ~50% of the time. He continues to require min A for ambulation for safety and stability. Pt would continue to benefit from skilled physical therapy services at this time while admitted and after d/c to address the below listed limitations in order to improve overall safety and independence with functional mobility.   Follow Up Recommendations  SNF     Equipment Recommendations  None recommended by PT    Recommendations for Other Services       Precautions / Restrictions Precautions Precautions: Fall Restrictions Weight Bearing Restrictions: No    Mobility  Bed Mobility Overal bed mobility: Needs Assistance Bed Mobility: Supine to Sit;Sit to Supine     Supine to sit: Supervision;HOB elevated Sit to supine: Supervision;HOB elevated   General bed mobility comments: for safety  Transfers Overall transfer level: Needs assistance Equipment used: None Transfers: Sit to/from Stand Sit to Stand: Min guard         General transfer comment: min guard for safety  Ambulation/Gait Ambulation/Gait assistance: Min assist Gait Distance (Feet): (hallway ambulation) Assistive device: None Gait Pattern/deviations: Step-through  pattern;Decreased step length - right;Decreased step length - left;Drifts right/left;Decreased stride length Gait velocity: decreased Gait velocity interpretation: 1.31 - 2.62 ft/sec, indicative of limited community ambulator General Gait Details: multimodal cues for navigating environment and for increased awareness to R side; pt continues to run into objects/obstacles on R side. Pt with intermittent awareness throughout, able to find his room to return to at end   [employer]    Modified Rankin (Stroke Patients Only) Modified Rankin (Stroke Patients Only) Pre-Morbid Rankin Score: No symptoms Modified Rankin: Moderately severe disability     Balance Overall balance assessment: Needs assistance Sitting-balance support: Feet supported;No upper extremity supported Sitting balance-Leahy Scale: Good     Standing balance support: No upper extremity supported;During functional activity Standing balance-Leahy Scale: Fair Standing balance comment: min guard for safety                            Cognition Arousal/Alertness: Awake/alert Behavior During Therapy: WFL for tasks assessed/performed Overall Cognitive Status: Difficult to assess Area of Impairment: Following commands;Problem solving                       Following Commands: Follows one step commands inconsistently;Follows one step commands with increased time     Problem Solving: Requires verbal cues;Difficulty sequencing;Requires tactile cues General Comments: continues to present with global aphasia      Exercises      General Comments        Pertinent Vitals/Pain Pain Assessment: Faces Faces Pain Scale: No hurt    Home Living  Prior Function            PT Goals (current goals can now be found in the care plan section) Acute Rehab PT Goals PT Goal Formulation: Patient unable to participate in goal setting Time For Goal  Achievement: 09/26/18 Potential to Achieve Goals: Good Progress towards PT goals: Progressing toward goals    Frequency    Min 3X/week      PT Plan Current plan remains appropriate    Co-evaluation              AM-PAC PT "6 Clicks" Daily Activity  Outcome Measure  Difficulty turning over in bed (including adjusting bedclothes, sheets and blankets)?: None Difficulty moving from lying on back to sitting on the side of the bed? : None Difficulty sitting down on and standing up from a chair with arms (e.g., wheelchair, bedside commode, etc,.)?: A Little Help needed moving to and from a bed to chair (including a wheelchair)?: A Little Help needed walking in hospital room?: A Little Help needed climbing 3-5 steps with a railing? : A Little 6 Click Score: 20    End of Session Equipment Utilized During Treatment: Gait belt Activity Tolerance: Patient tolerated treatment well Patient left: in bed;with call bell/phone within reach;with bed alarm set Nurse Communication: Mobility status PT Visit Diagnosis: Other abnormalities of gait and mobility (R26.89);Other symptoms and signs involving the nervous system (W96.045)     Time: 4098-1191 PT Time Calculation (min) (ACUTE ONLY): 10 min  Charges:  $Gait Training: 8-22 mins                     Deborah Chalk, [place], DPT  Acute Rehabilitation Services Pager [phone] Office [phone]     Alessandra Bevels Ishaq Maffei 09/19/2018, 11:30 AM

## 2018-09-19 NOTE — Progress Notes (Signed)
   Subjective: No acute events overnight.  Arthur [name] is able to answer questions in few word appropriate answers.  I am unable to determine full review of systems given continued aphasia.  He does however say "no" to questions about shortness of breath, abdominal pain, chest pain.  Objective:  Vital signs in last 24 hours: Vitals:   09/19/18 0042 09/19/18 0357 09/19/18 0800 09/19/18 0900  BP: 97/68 100/71 110/80 114/79  Pulse: (!) 51 (!) 51 (!) 59 (!) 51  Resp: '18 18 18 16  '$ Temp: 97.7 F (36.5 C) 97.6 F (36.4 C) 97.8 F (36.6 C) 97.6 F (36.4 C)  TempSrc: Oral Oral Oral Oral  SpO2: 98% 100% 100% 100%  Weight:      Height:       Physical Exam  Constitutional: He appears well-developed and well-nourished.  HENT:  Head: Normocephalic and atraumatic.  Eyes: Pupils are equal, round, and reactive to light. EOM are normal.  Neck: Normal range of motion. Neck supple.  Cardiovascular: Regular rhythm and normal heart sounds.  Pulmonary/Chest: Effort normal and breath sounds normal.  Neurological: He is alert.  Arthur [name] is able to answer questions with appropriate few word answers.  When asked to raise his right arm, he raises both arms.  This is a large improvement from when I first met him on admission.  He still has trouble naming certain objects such as a pencil and plush toy.   Skin: Skin is warm.  Psychiatric:  Cannot determine mood or affect due to continued aphasia.  Nursing note and vitals reviewed.   Assessment/Plan:  Principal Problem:   CVA (cerebral vascular accident) [facility]) Active Problems:   Persistent atrial fibrillation   Global aphasia   Bipolar 1 disorder ([place])   Alcoholism ([place])   Essential hypertension   Hyperlipidemia LDL goal <70  Arthur [name] is a 62 year old male with known atrial fibrillation who presented with aphasia and found to have left ACA/MCA territory infarct with bilateral white matter changes.Etiology likely embolism from atrial  fibrillation. Per case management note, Arthur [name] will be discharged to a SNF rehab under an LOG from the hospital until patient's Medicaid begins. We are awaiting SNF placement.    Left ACA/MCA Infarct 1. ContinueASA'81mg'$  PO 2.Continuehome Xarelto 20 mg QD 3. Continue rosuvastatin 40 mg QD 4. Continue PT/OT/ST  A-fib:Currently in NSR 1.Continuehome sotalol 80 mg BID  Dispo: Anticipated discharge in approximately 2-3 days to SNF.   Carroll Sage, MD 09/19/2018, 11:45 AM Pager: [phone]

## 2018-09-20 NOTE — Progress Notes (Signed)
   Subjective: Arthur [name] continues to have expressive aphasia.  He Is able to answer few questions with one-word answers.  I a neck m unable to adequately determine review of systems due to his continued aphasia.  Objective:  Vital signs in last 24 hours: Vitals:   09/19/18 2357 09/20/18 0358 09/20/18 0732 09/20/18 1156  BP: 100/76 98/78 108/85 98/71  Pulse: 62 (!) 52 66 96  Resp:  18 18 18   Temp:  (!) 97.5 F (36.4 C) 97.6 F (36.4 C) 97.9 F (36.6 C)  TempSrc:  Oral Oral   SpO2:  98% 100% 98%  Weight:      Height:       Physical Exam  Constitutional: He is well-developed, well-nourished, and in no distress.  HENT:  Head: Normocephalic and atraumatic.  Eyes: EOM are normal.  Cardiovascular: Normal rate, regular rhythm, normal heart sounds and intact distal pulses.  Pulmonary/Chest: Effort normal and breath sounds normal.  Abdominal: Soft. Bowel sounds are normal.  Musculoskeletal:  No LE edema or tenderness to palpation.  Neurological: He is alert.  He is able to say "I am waiting "when asked how he is doing.  He is unable to follow verbal commands such as raise her arm.  He also cannot name a pencil when asked what it is.  Skin: Skin is warm and dry.  Psychiatric: Affect and judgment normal.  Nursing note and vitals reviewed.   Assessment/Plan:  Principal Problem:   CVA (cerebral vascular accident) [facility]) Active Problems:   Persistent atrial fibrillation   Global aphasia   Bipolar 1 disorder ([facility])   Alcoholism ([facility])   Essential hypertension   Hyperlipidemia LDL goal <70  Arthur [name] is a 62 year old male with known atrial fibrillation who presented with aphasia and found to have left ACA/MCA territory infarct with bilateral white matter changes.Etiology likely embolism from atrial fibrillation. Awaiting SNF placement.    Left ACA/MCA Infarct 1. ContinueASA81mg  PO 2.Continuehome Xarelto 20 mg QD 3. Continue rosuvastatin 40 mg QD 4. Continue  PT/OT/ST  A-fib:Currently in NSR 1.Continuehome sotalol 80 mg BID  Dispo: Anticipated discharge in approximately2-3 days to SNF.  Synetta Shadow, MD 09/20/2018, 1:08 PM Pager: [phone]

## 2018-09-20 NOTE — Progress Notes (Signed)
  Speech Language Pathology Treatment: Cognitive-Linquistic  Patient Details Name: Arthur [name] MRN: [id number] DOB: [birth year] Today's Date: 09/20/2018 Time: 4098-1191 SLP Time Calculation (min) (ACUTE ONLY): 30 min  Assessment / Plan / Recommendation Clinical Impression  Pt was pleasant and focused throughout aphasia treatment session. He provided appropriate greeting and was better able to follow command to "look at me" when clincian provided exaggerated articulatory cues to facilitate verbal expression. During confrontation naming task, pt independently named coins ("cent, ten cents, 25 cents") and "fifty dollar" bill. Word repetition and matching functional worsds to objects continues to present significant challenges. He exhibited 50% accuracy when matching line drawing pictures to physical objects and nearly 100% accuracy during a sorting task (matching different colored/shaped blocks). Use of max visual and auditory cues and shorter sentences promote most effective comprehension and overall communication with this pt. ST will continue to follow acutely to provide treatment with comprehension and verbal expression.   HPI HPI: 62 year old male with known atrial fibrillation and subtherapeutic INR presenting globally mute.  MRI shows Multifocal signal abnormality involving the left cerebral hemisphere, consistent with late subacute to chronic left MCA territory infarcts. Evidence for associated hemorrhage at the left caudate and lentiform nuclei. No visible hemorrhage within this region on prior CT.       SLP Plan  Continue with current plan of care       Recommendations                   Oral Care Recommendations: Oral care BID Follow up Recommendations: Skilled Nursing facility;24 hour supervision/assistance SLP Visit Diagnosis: Aphasia (R47.01) Plan: Continue with current plan of care       Suzzette Righter, Student SLP                 Suzzette Righter 09/20/2018, 3:10  PM

## 2018-09-21 NOTE — Progress Notes (Signed)
Physical Therapy Treatment Patient Details Name: Arthur [name] MRN: [id number] DOB: [birth year] Today's Date: 09/21/2018    History of Present Illness Pt is a 62 y/o male with PMH of HIV, alcohol abuse, HTN, bipolar disease, atrial fibrillation on chronic Xarelot, and history of TIA who presented expressive/receptive aphasia and intermittent confusion. Pt found to have multiple Lt sided subacute infarcts - embolic -left caudate head subacute hemorrhagic infarct likely from afib with sub therapeutic INR.    PT Comments    Pt making fair progress. He continues to demonstrate significant cognitive deficits with poor safety awareness. Attempted cognitive tasks with mobility with ~25% appropriate/accurate responses. Pt would continue to benefit from skilled physical therapy services at this time while admitted and after d/c to address the below listed limitations in order to improve overall safety and independence with functional mobility.    Follow Up Recommendations  SNF     Equipment Recommendations  None recommended by PT    Recommendations for Other Services       Precautions / Restrictions Precautions Precautions: Fall Restrictions Weight Bearing Restrictions: No    Mobility  Bed Mobility Overal bed mobility: Needs Assistance Bed Mobility: Supine to Sit;Sit to Supine     Supine to sit: Supervision;HOB elevated Sit to supine: Supervision;HOB elevated   General bed mobility comments: for safety  Transfers Overall transfer level: Needs assistance Equipment used: None Transfers: Sit to/from Stand Sit to Stand: Min guard         General transfer comment: pt stood from EOB x1 with LOB posteriorly, requiring him to return to sitting EOB, second attempt successful  Ambulation/Gait Ambulation/Gait assistance: Min guard;Min assist Gait Distance (Feet): (hallway ambulation) Assistive device: None Gait Pattern/deviations: Step-through pattern;Decreased step length -  right;Decreased step length - left;Drifts right/left;Decreased stride length Gait velocity: decreased   General Gait Details: multimodal cues for navigating environment and for increased awareness to R side; pt continues to run into objects/obstacles on R side. Pt with intermittent awareness throughout, able to find his room to return to at end   Stairs Stairs: Yes Stairs assistance: Min assist Stair Management: Two rails;Alternating pattern;Forwards Number of Stairs: 2(2 x1 and 3 x1) General stair comments: slow and cautious, min A for stability and safety   Wheelchair Mobility    Modified Rankin (Stroke Patients Only) Modified Rankin (Stroke Patients Only) Pre-Morbid Rankin Score: No symptoms Modified Rankin: Moderately severe disability     Balance Overall balance assessment: Needs assistance Sitting-balance support: Feet supported;No upper extremity supported Sitting balance-Leahy Scale: Good     Standing balance support: No upper extremity supported;During functional activity Standing balance-Leahy Scale: Fair                              Cognition Arousal/Alertness: Awake/alert Behavior During Therapy: WFL for tasks assessed/performed Overall Cognitive Status: Difficult to assess Area of Impairment: Following commands;Problem solving                       Following Commands: Follows one step commands inconsistently;Follows one step commands with increased time     Problem Solving: Requires verbal cues;Difficulty sequencing;Requires tactile cues General Comments: continues to present with global aphasia      Exercises      General Comments        Pertinent Vitals/Pain Pain Assessment: Faces Faces Pain Scale: No hurt    Home Living  Prior Function            PT Goals (current goals can now be found in the care plan section) Acute Rehab PT Goals PT Goal Formulation: Patient unable to participate in  goal setting Time For Goal Achievement: 09/26/18 Potential to Achieve Goals: Good Progress towards PT goals: Progressing toward goals    Frequency    Min 3X/week      PT Plan Current plan remains appropriate    Co-evaluation              AM-PAC PT "6 Clicks" Daily Activity  Outcome Measure  Difficulty turning over in bed (including adjusting bedclothes, sheets and blankets)?: None Difficulty moving from lying on back to sitting on the side of the bed? : None Difficulty sitting down on and standing up from a chair with arms (e.g., wheelchair, bedside commode, etc,.)?: A Lot Help needed moving to and from a bed to chair (including a wheelchair)?: A Little Help needed walking in hospital room?: A Little Help needed climbing 3-5 steps with a railing? : A Little 6 Click Score: 19    End of Session Equipment Utilized During Treatment: Gait belt Activity Tolerance: Patient tolerated treatment well Patient left: in bed;with call bell/phone within reach;with bed alarm set Nurse Communication: Mobility status PT Visit Diagnosis: Other abnormalities of gait and mobility (R26.89);Other symptoms and signs involving the nervous system (R29.898)     Time: 4098-1191 PT Time Calculation (min) (ACUTE ONLY): 16 min  Charges:  $Gait Training: 8-22 mins                     Deborah Chalk, [place], DPT  Acute Rehabilitation Services Pager [phone] Office [phone]     Arthur [name] 09/21/2018, 4:41 PM

## 2018-09-21 NOTE — Progress Notes (Signed)
Patient become more confused as the day goes by and refused care. He refused to take his Statin and Xarelto. Reorient and educate patient but still refused. Will continue to monitor patient.

## 2018-09-21 NOTE — Progress Notes (Signed)
   Subjective: No acute events overnight.  Global aphasia improving.  He is able to answer questions with several word answers he is however unable to name a pencil or other objects.  Objective:  Vital signs in last 24 hours: Vitals:   09/21/18 0721 09/21/18 1212 09/21/18 1225 09/21/18 1230  BP: 90/67 (!) 74/56 110/76 100/70  Pulse: 63 65    Resp: 18 18    Temp: (!) 97.5 F (36.4 C) 98.4 F (36.9 C)    TempSrc: Oral Oral    SpO2: 100% 100%    Weight:      Height:       Physical Exam  Constitutional: He appears well-developed and well-nourished.  HENT:  Head: Normocephalic and atraumatic.  Eyes: Pupils are equal, round, and reactive to light. EOM are normal.  Neck: Normal range of motion. Neck supple.  Cardiovascular: Normal rate, regular rhythm and normal heart sounds.  Pulmonary/Chest: Effort normal and breath sounds normal.  Musculoskeletal:  No lower extremity edema or tenderness bilaterally.  Neurological: He is alert.  He answer my questions and appropriate 1-2 word answers.  He is unable to name objects.  He is unable to follow verbal commands.  Skin: Skin is warm and dry.  Psychiatric:  Pleasant appearing male.  Unable to determine affect due to aphasia.  Nursing note and vitals reviewed.   Assessment/Plan:  Principal Problem:   CVA (cerebral vascular accident) [facility]) Active Problems:   Persistent atrial fibrillation   Global aphasia   Bipolar 1 disorder (HCC)   Alcoholism (HCC)   Essential hypertension   Hyperlipidemia LDL goal <70  Mr. Scioneaux is a 62 year old male with known atrial fibrillation who presented with aphasia and found to have left ACA/MCA territory infarct with bilateral white matter changes.Etiology likely embolism from atrial fibrillation. Per case management note, Mr. Darroch will be discharged to a SNF rehab under an LOG from the hospital until patient's Medicaid begins. We are awaiting SNF placement.    Left ACA/MCA Infarct 1.  ContinueASA81mg  PO 2.Continuehome Xarelto 20 mg QD 3. Continue rosuvastatin 40 mg QD 4. Continue PT/OT/ST  A-fib:Currently in NSR 1.Continuehome sotalol 80 mg BID  Dispo: Anticipated discharge in approximately2-3 days to SNF.  Synetta Shadow, MD 09/21/2018, 12:44 PM Pager: [phone]

## 2018-09-21 NOTE — Plan of Care (Signed)
  Problem: Clinical Measurements: Goal: Ability to maintain clinical measurements within normal limits will improve Outcome: Progressing Goal: Will remain free from infection Outcome: Progressing Goal: Respiratory complications will improve Outcome: Progressing Goal: Cardiovascular complication will be avoided Outcome: Progressing   Problem: Coping: Goal: Level of anxiety will decrease Outcome: Progressing   Problem: Pain Managment: Goal: General experience of comfort will improve Outcome: Progressing   Problem: Safety: Goal: Ability to remain free from injury will improve Outcome: Progressing   Problem: Ischemic Stroke/TIA Tissue Perfusion: Goal: Complications of ischemic stroke/TIA will be minimized Outcome: Progressing

## 2018-09-22 DIAGNOSIS — I4891 Unspecified atrial fibrillation: Secondary | ICD-10-CM

## 2018-09-22 NOTE — Progress Notes (Signed)
   Subjective: Arthur [name] is doing well this AM. He continues to have receptive aphasia but does seem to occasionally respond appropriately. Overnight there were some issues with the patient declining his medications. Overall he remains medical stable and we are awaiting SNF placement. Plan discussed with the patient.  Objective: Vital signs in last 24 hours: Vitals:   09/21/18 1556 09/21/18 1952 09/21/18 2326 09/22/18 0839  BP: 113/80 114/77 116/73 97/63  Pulse: (!) 55 (!) 58 (!) 55 62  Resp: 18 18 18 18   Temp: (!) 97.4 F (36.3 C) 98.8 F (37.1 C) 98 F (36.7 C) 98.2 F (36.8 C)  TempSrc: Oral Oral Oral Oral  SpO2: 100% 99% 98% 99%  Weight:      Height:       General: Well nourished male in no acute distress Pulm: Good air movement with no wheezing or crackles  CV: RRR, no murmurs, no rubs  Abdomen: Soft, non-distended, no tenderness to palpation   Assessment/Plan:  Arthur [name] is a 62 year old male with known atrial fibrillation who presented with aphasia and found to have left ACA/MCA territory infarct with bilateral white matter changes.Etiology likely embolism from atrial fibrillation. We are awaiting SNF placement.  Left ACA/MCA Infarct 1. ContinueASA81mg  PO 2.Continuehome Xarelto 20 mg QD 3. Continue rosuvastatin 40 mg QD 4. Continue PT/OT/ST  A-fib:Currently in NSR 1.Continuehome sotalol 80 mg BID  Dispo: Medically stable. Awaiting SNF placement.  Levora Dredge, MD 09/22/2018, 10:28 AM

## 2018-09-22 NOTE — Progress Notes (Signed)
Patient refused his V/S taken, educated on the importance but still refused it, continue to monitor him, became agitated and was assisted to calm down

## 2018-09-23 DIAGNOSIS — I959 Hypotension, unspecified: Secondary | ICD-10-CM

## 2018-09-23 NOTE — Progress Notes (Addendum)
   Subjective: Arthur [name] appears to be doing well this morning.  No acute events overnight.  He reports that he is ready for food.  He continues to have receptive aphasia but will sometimes respond appropriately.  He occasionally follows commands. Awaiting placement to a skilled nursing facility.  Patient is in agreement with the plan.  Objective:  Vital signs in last 24 hours: Vitals:   09/22/18 1932 09/22/18 2344 09/23/18 0401 09/23/18 0428  BP: 97/64 99/61 (!) 75/47 (!) 86/48  Pulse: (!) 58 96 (!) 52   Resp: 16 16 16    Temp: 98.1 F (36.7 C) 98.5 F (36.9 C) 98.6 F (37 C)   TempSrc: Oral Oral Oral   SpO2: 97% 98% 98%   Weight:      Height:        General: Well nourished, well appearing, NAD  Cardiac: RRR, normal S1, S2, no murmurs, rubs or gallops  Pulmonary: Lungs CTA bilaterally, no wheezing, rhonchi or rales  Abdomen: Soft, non-tender, +bowel sounds, no guarding or masses noted  Neuro: Alert, not oriented, moves all extremities  Assessment/Plan:  Principal Problem:   CVA (cerebral vascular accident) (HCC) Active Problems:   Bipolar 1 disorder (HCC)   Alcoholism (HCC)   Essential hypertension   Persistent atrial fibrillation   Global aphasia   Hyperlipidemia LDL goal <70  Arthur [name] is a 62 year old male with a history of atrial fibrillation who presented with a aphasia was found to have left ACA/PCA territory infarction with bilateral white matter changes.  Patient is medically stable and we are awaiting SNF placement.  Left ACA/MCA infarct -Continue ASA 81 mg p.o. -Continue Xarelto 20 mg daily - Continue rosuvastatin 40 mg daily - Continue PT/OT/ST - Social work on board to assist with SNF placement  Atrial fibrillation: Patient is currently in normal sinus rhythm. -Continue sotalol 80 mg twice daily   Hypotension:   Patient is hypotensive, 75/47 BP of 86/48.  He has not had any symptoms or new changes from this. Will continue to monitor for now.  FEN: No  fluids, replete lytes prn, regular diet  VTE ppx: Xarelto Code Status: FULL    Dispo: Anticipated discharge in approximately 1-2 day(s) pending SNF placement  Arthur Severance, MD 09/23/2018, 5:38 AM Pager: [phone]

## 2018-09-24 NOTE — Progress Notes (Signed)
  Speech Language Pathology Treatment: Cognitive-Linquistic  Patient Details Name: Arthur [name] MRN: [id number] DOB: Jun 07, 1956 Today's Date: 09/24/2018 Time: 4098-1191 SLP Time Calculation (min) (ACUTE ONLY): 30 min  Assessment / Plan / Recommendation Clinical Impression  Provided opportunities in a functional task for spontaneous language, expression of wants and needs, basic verbal and functional problem solving and following one step commands. Pt needed moderate verbal and visual cues on all accounts during trip to gift shop while way finding, using the elevator, choosing a snack and paying. Max verbal and articulatory cues provided for pt to name his snack "fig newton" with success at end of session. Will continue efforts.    HPI HPI: 62 year old male with known atrial fibrillation and subtherapeutic INR presenting globally mute.  MRI shows Multifocal signal abnormality involving the left cerebral hemisphere, consistent with late subacute to chronic left MCA territory infarcts. Evidence for associated hemorrhage at the left caudate and lentiform nuclei. No visible hemorrhage within this region on prior CT.       SLP Plan  Continue with current plan of care       Recommendations                   Follow up Recommendations: Skilled Nursing facility;24 hour supervision/assistance SLP Visit Diagnosis: Aphasia (R47.01) Plan: Continue with current plan of care       GO               Harlon Ditty, MA CCC-SLP  Acute Rehabilitation Services Pager [phone] Office [phone]  Claudine Mouton 09/24/2018, 1:15 PM

## 2018-09-24 NOTE — Progress Notes (Signed)
Physical Therapy Treatment Patient Details Name: Arthur [name] MRN: [id number] DOB: [birth year] Today's Date: 09/24/2018    History of Present Illness Pt is a 62 y/o male with PMH of HIV, alcohol abuse, HTN, bipolar disease, atrial fibrillation on chronic Xarelot, and history of TIA who presented expressive/receptive aphasia and intermittent confusion. Pt found to have multiple Lt sided subacute infarcts - embolic -left caudate head subacute hemorrhagic infarct likely from afib with sub therapeutic INR.    PT Comments    Patient progressing with standing balance over time with activities for increased L lateral weight shift.  Still running into objects on R hand side and needs assist to maneuver around it.  Will need SNF rehab at d/c.    Follow Up Recommendations  SNF     Equipment Recommendations  None recommended by PT    Recommendations for Other Services       Precautions / Restrictions Precautions Precautions: Fall    Mobility  Bed Mobility         Supine to sit: Modified independent (Device/Increase time) Sit to supine: Modified independent (Device/Increase time)      Transfers   Equipment used: None Transfers: Sit to/from Stand Sit to Stand: Supervision         General transfer comment: assist for safety; pt with increased BOS and some instability in initial standing  Ambulation/Gait Ambulation/Gait assistance: Min assist Gait Distance (Feet): 125 Feet Assistive device: None Gait Pattern/deviations: Step-through pattern;Decreased stride length;Drifts right/left;Staggering right     General Gait Details: increased veering to R and difficulty navigating around obstacles on R without min A.    Stairs             Wheelchair Mobility    Modified Rankin (Stroke Patients Only) Modified Rankin (Stroke Patients Only) Pre-Morbid Rankin Score: No symptoms Modified Rankin: Moderately severe disability     Balance Overall balance assessment:  Needs assistance   Sitting balance-Leahy Scale: Good     Standing balance support: No upper extremity supported Standing balance-Leahy Scale: Fair Standing balance comment: stands with wife based stance with increase R weight shift               High Level Balance Comments: standing step taps forward and facing to L with R LE for improved L lateral weight shift, side stepping, high stepping (forward and back), and toe and heel walking at rail in hallway            Cognition Arousal/Alertness: Awake/alert Behavior During Therapy: [facility] for tasks assessed/performed Overall Cognitive Status: Difficult to assess Area of Impairment: Following commands;Problem solving                       Following Commands: Follows one step commands inconsistently;Follows one step commands with increased time              Exercises      General Comments        Pertinent Vitals/Pain Faces Pain Scale: No hurt    Home Living                      Prior Function            PT Goals (current goals can now be found in the care plan section) Progress towards PT goals: Progressing toward goals    Frequency    Min 3X/week      PT Plan Current plan remains appropriate    Co-evaluation  AM-PAC PT "6 Clicks" Daily Activity  Outcome Measure  Difficulty turning over in bed (including adjusting bedclothes, sheets and blankets)?: None Difficulty moving from lying on back to sitting on the side of the bed? : None Difficulty sitting down on and standing up from a chair with arms (e.g., wheelchair, bedside commode, etc,.)?: Unable Help needed moving to and from a bed to chair (including a wheelchair)?: A Little Help needed walking in hospital room?: A Little Help needed climbing 3-5 steps with a railing? : A Lot 6 Click Score: 17    End of Session Equipment Utilized During Treatment: Gait belt Activity Tolerance: Patient tolerated treatment  well Patient left: in bed;with call bell/phone within reach;with bed alarm set   PT Visit Diagnosis: Other abnormalities of gait and mobility (R26.89);Other symptoms and signs involving the nervous system (R29.898)     Time: 1610-9604 PT Time Calculation (min) (ACUTE ONLY): 14 min  Charges:  $Gait Training: 8-22 mins                     Sheran Lawless, PT Acute Rehabilitation Services [phone] 09/24/2018    Elray Mcgregor 09/24/2018, 4:02 PM

## 2018-09-24 NOTE — Progress Notes (Signed)
Occupational Therapy Treatment Patient Details Name: Arthur [name] MRN: [id number] DOB: [birth year] Today's Date: 09/24/2018    History of present illness Pt is a 62 y/o male with PMH of HIV, alcohol abuse, HTN, bipolar disease, atrial fibrillation on chronic Xarelot, and history of TIA who presented expressive/receptive aphasia and intermittent confusion. Pt found to have multiple Lt sided subacute infarcts - embolic -left caudate head subacute hemorrhagic infarct likely from afib with sub therapeutic INR.   OT comments  Pt progressing towards established OT goals. Pt performing oral care at sink with Min guard A for safety and Max cues for sequencing and problem solving. Pt continues to present with aphasia, decreased cognition, and poor balance. Continue to recommend dc to post-acute rehab and will continue to follow acutely as admitted.    Follow Up Recommendations  SNF;Supervision/Assistance - 24 hour    Equipment Recommendations  Other (comment)(Defer to next venue)    Recommendations for Other Services      Precautions / Restrictions Precautions Precautions: Fall       Mobility Bed Mobility Overal bed mobility: Modified Independent       Supine to sit: Modified independent (Device/Increase time) Sit to supine: Modified independent (Device/Increase time)      Transfers Overall transfer level: Needs assistance Equipment used: None Transfers: Sit to/from Stand Sit to Stand: Supervision         General transfer comment: assist for safety    Balance Overall balance assessment: Needs assistance Sitting-balance support: Feet supported;No upper extremity supported Sitting balance-Leahy Scale: Good     Standing balance support: No upper extremity supported Standing balance-Leahy Scale: Fair Standing balance comment: stands with wife based stance with increase R weight shift               High Level Balance Comments: standing step taps forward and facing  to L with R LE for improved L lateral weight shift, side stepping, high stepping (forward and back), and toe and heel walking at rail in hallway           ADL either performed or assessed with clinical judgement   ADL Overall ADL's : Needs assistance/impaired     Grooming: Min guard;Standing;Cueing for sequencing(Max cues) Grooming Details (indicate cue type and reason): Min Guard A for safety at sink. Max cues for oral care. Pt requiring simple, direct cues to complete task                                     Vision       Perception     Praxis      Cognition Arousal/Alertness: Awake/alert Behavior During Therapy: WFL for tasks assessed/performed Overall Cognitive Status: Difficult to assess Area of Impairment: Following commands;Problem solving                       Following Commands: Follows one step commands inconsistently;Follows one step commands with increased time       General Comments: Requiring simple, direct cues during oral care (i.e. brush your teeth, pick up tooth brush, or rinse your mouth out and spit). Pt answering many questions with "I don't know". Pt also placing tooth paste into his mouth and then stating he brushed his teeth.         Exercises     Shoulder Instructions       General Comments VSS    Pertinent Vitals/  Pain       Pain Assessment: Faces Faces Pain Scale: No hurt Pain Intervention(s): Monitored during session  Home Living                                          Prior Functioning/Environment              Frequency  Min 2X/week        Progress Toward Goals  OT Goals(current goals can now be found in the care plan section)  Progress towards OT goals: Progressing toward goals  Acute Rehab OT Goals Patient Stated Goal: unable to state OT Goal Formulation: Patient unable to participate in goal setting Time For Goal Achievement: 09/26/18 Potential to Achieve Goals:  Good ADL Goals Pt Will Perform Grooming: standing;with modified independence Pt Will Perform Lower Body Dressing: with modified independence;sit to/from stand Pt Will Transfer to Toilet: with modified independence;ambulating;regular height toilet Additional ADL Goal #1: Pt will follow 2-3 step commands with minimal cueing in order to participate in ADLs. Additional ADL Goal #2: Pt will sequence ADL tasks with minimal cueing.  Plan Discharge plan remains appropriate    Co-evaluation                 AM-PAC PT "6 Clicks" Daily Activity     Outcome Measure   Help from another person eating meals?: None Help from another person taking care of personal grooming?: A Little Help from another person toileting, which includes using toliet, bedpan, or urinal?: A Little Help from another person bathing (including washing, rinsing, drying)?: A Little Help from another person to put on and taking off regular upper body clothing?: A Little Help from another person to put on and taking off regular lower body clothing?: A Little 6 Click Score: 19    End of Session Equipment Utilized During Treatment: Gait belt  OT Visit Diagnosis: Unsteadiness on feet (R26.81);Other symptoms and signs involving cognitive function;Cognitive communication deficit (R41.841) Symptoms and signs involving cognitive functions: Cerebral infarction   Activity Tolerance Patient tolerated treatment well   Patient Left in bed;with call bell/phone within reach;with bed alarm set   Nurse Communication Mobility status        Time: 1610-9604 OT Time Calculation (min): 12 min  Charges: OT General Charges $OT Visit: 1 Visit OT Treatments $Self Care/Home Management : 8-22 mins  Rashon Rezek MSOT, OTR/L Acute Rehab Pager: [phone] Office: [phone]   Theodoro Grist Torrez Renfroe 09/24/2018, 5:45 PM

## 2018-09-24 NOTE — Progress Notes (Addendum)
   Subjective: Arthur [name] is doing well today, no acute events overnight.  He is still having some recent receptive aphasia.  He denies any new pain or symptoms today.  He is eating his meal with no issues when I saw him.  We are still waiting for SNF placement.  He is in agreement with the plan.  Objective:  Vital signs in last 24 hours: Vitals:   09/23/18 1602 09/23/18 2053 09/24/18 0013 09/24/18 0438  BP: 97/65 101/71 91/61 90/61   Pulse: (!) 50 (!) 50 94 (!) 49  Resp: 18 18 18 18   Temp: 99 F (37.2 C) 98.1 F (36.7 C) 98.7 F (37.1 C) 98.4 F (36.9 C)  TempSrc: Oral Oral Oral Oral  SpO2: 97% 98% 97% 99%  Weight:      Height:        General: Well nourished, well appearing, NAD  Cardiac: RRR, normal S1, S2, no murmurs, rubs or gallops  Pulmonary: Lungs CTA bilaterally, no wheezing, rhonchi or rales  Abdomen: Soft, non-tender, +bowel sounds, no guarding or masses noted  Neuro: Alert, not oriented, moves all extremities  Assessment/Plan:  Principal Problem:   CVA (cerebral vascular accident) (HCC) Active Problems:   Bipolar 1 disorder (HCC)   Alcoholism (HCC)   Essential hypertension   Persistent atrial fibrillation   Global aphasia   Hyperlipidemia LDL goal <70  Arthur [name] is a 62 year old male with a history of atrial fibrillation who presented with a aphasia was found to have left ACA/PCA territory infarction with bilateral white matter changes.  Patient is medically stable and we are awaiting SNF placement.  Left ACA/MCA infarct: No new changes since yesterday.  Patient is stable. -Continue ASA 81 mg p.o. -Continue Xarelto 20 mg daily - Continue rosuvastatin 40 mg daily - Continue PT/OT/ST - Social work on board to assist with SNF placement  Atrial fibrillation: Patient is currently in normal sinus rhythm. -Continue sotalol 80 mg twice daily   Hypotension:    Blood pressure has improved today is stable around 90-100 systolic blood pressure.  Patient has not  been having any symptoms.  We will continue to monitor.   FEN: No fluids, replete lytes prn, regular diet  VTE ppx: Xarelto Code Status: FULL    Dispo: Discharge is pending SNF placement.  Claudean Severance, MD 09/24/2018, 6:03 AM Pager: [phone]

## 2018-09-25 LAB — GLUCOSE, CAPILLARY: GLUCOSE-CAPILLARY: 86 mg/dL (ref 70–99)

## 2018-09-25 NOTE — Progress Notes (Signed)
  Speech Language Pathology Treatment: Cognitive-Linquistic  Patient Details Name: Arthur [name] MRN: [id number] DOB: 07-19-1956 Today's Date: 09/25/2018 Time: 1000-1035 SLP Time Calculation (min) (ACUTE ONLY): 35 min  Assessment / Plan / Recommendation Clinical Impression  Pt seen to target verbal expression and auditory comprehension in functional and structured language tasks. Pt needed max contextual and visual cues to appropriately communicate wants and needs with use of remote, toileting, mobility and washing hands. Visual cues for sequencing needed with basic functional multistep tasks (hand washing). Utilized playing cards to transition from automatic counting task to naming face cards with max articulatory cues with eventual 70% accuracy. Pt making gradual progress with language.   HPI HPI: 62 year old male with known atrial fibrillation and subtherapeutic INR presenting globally mute.  MRI shows Multifocal signal abnormality involving the left cerebral hemisphere, consistent with late subacute to chronic left MCA territory infarcts. Evidence for associated hemorrhage at the left caudate and lentiform nuclei. No visible hemorrhage within this region on prior CT.       SLP Plan  Continue with current plan of care       Recommendations                   Plan: Continue with current plan of care       GO               Arthur Ditty, MA CCC-SLP  Acute Rehabilitation Services Pager [phone] Office [phone]  Claudine Mouton 09/25/2018, 10:41 AM

## 2018-09-25 NOTE — Progress Notes (Signed)
  Speech Language Pathology Treatment: Cognitive-Linquistic  Patient Details Name: Arthur [name] MRN: [id number] DOB: 07/21/1956 Today's Date: 09/25/2018 Time: 4098-1191 SLP Time Calculation (min) (ACUTE ONLY): 13 min  Assessment / Plan / Recommendation Clinical Impression  Pt was seen for additional communication therapy, with focus on cognitive-linguistic goals and carryover from earlier session. Pt needed Min cues for sequencing of cards (2-10) when given in two sets of five. Min cues were given for labeling numbers when in sequence; Max cues given for numbers in isolation and face cards. Pt gave a social greeting upon exit with Min cues. Will continue to follow to maximize functional communication.    HPI HPI: 62 year old male with known atrial fibrillation and subtherapeutic INR presenting globally mute.  MRI shows Multifocal signal abnormality involving the left cerebral hemisphere, consistent with late subacute to chronic left MCA territory infarcts. Evidence for associated hemorrhage at the left caudate and lentiform nuclei. No visible hemorrhage within this region on prior CT.       SLP Plan  Continue with current plan of care       Recommendations                   Follow up Recommendations: Skilled Nursing facility;24 hour supervision/assistance SLP Visit Diagnosis: Aphasia (R47.01) Plan: Continue with current plan of care       GO                Maxcine Ham 09/25/2018, 4:28 PM  Maxcine Ham, M.A. CCC-SLP Acute Herbalist [phone] Office [phone]

## 2018-09-25 NOTE — Progress Notes (Signed)
   Subjective: Arthur [name] continues to be comfortable, no acute events overnight.  He has receiving his food now and seems excited for it.  He still confused and will intermittently follow commands.  He does still have receptive aphasia.  We discussed that he will be going to SNF.  And he seems to be in agreement with the plan.  Objective:  Vital signs in last 24 hours: Vitals:   09/24/18 1701 09/24/18 1930 09/24/18 2304 09/25/18 0325  BP: 116/79 112/78 111/73 118/78  Pulse: (!) 55 (!) 52 (!) 52 (!) 45  Resp: 18 18 18 18   Temp: 98.1 F (36.7 C) 99.1 F (37.3 C) 98 F (36.7 C) 97.8 F (36.6 C)  TempSrc: Oral Oral Oral Oral  SpO2: 99% 98% 95% 100%  Weight:      Height:        General: Well nourished, well appearing, NAD Cardiac: RRR, normal S1, S2, no murmurs, rubs or gallops Pulmonary: Lungs CTA bilaterally, no wheezing, rhonchi or rales  Abdomen: Soft, non-tender, +bowel sounds, no guarding or masses noted    Assessment/Plan:  Principal Problem:   CVA (cerebral vascular accident) (HCC) Active Problems:   Bipolar 1 disorder (HCC)   Alcoholism (HCC)   Essential hypertension   Persistent atrial fibrillation   Global aphasia   Hyperlipidemia LDL goal <70 This is a 62 year old male with a history of atrial fibrillation (on chronic xeralto) who presented with aphasia and was found to have a left ACA/MCA territory infarction with bilateral white matter changes. He continues to be medically stable and we are awaiting SNF placement.   Left ACA/MCA infarct: Patient continues to be stable with no new findings. Awaiting SNF placement.  -Continue ASA 81 mg p.o. -Continue Xarelto 20 mg daily - Continue rosuvastatin 40 mg daily - Continue PT/OT/ST - Social work on board to assist with SNF placement  Atrial fibrillation:Patient is currently in normal sinus rhythm. -Continue sotalol 80 mg twice daily  Hypotension: This has improved, BP around 110/70. No new symptoms at this  time.   FEN: No fluids, replete lytes prn, regular diet  VTE ppx: Xeralto Code Status: FULL   Dispo: Anticipated discharge is pending SNF.   Claudean Severance, MD 09/25/2018, 6:25 AM Pager: [phone]

## 2018-09-26 ENCOUNTER — Telehealth: Payer: Self-pay

## 2018-09-26 ENCOUNTER — Telehealth: Payer: Self-pay | Admitting: Pharmacist

## 2018-09-26 MED ORDER — BICTEGRAVIR-EMTRICITAB-TENOFOV 50-200-25 MG PO TABS: 1.0000 | ORAL_TABLET | Freq: Every day | ORAL | 0 refills | Status: DC

## 2018-09-26 MED FILL — BIKTARVY 50-200-25 MG TABS: 50-200-25 | 30 days supply | Qty: 30 | Fill #0

## 2018-09-26 NOTE — Progress Notes (Signed)
   Subjective: Arthur [name] continues to have global aphasia.  He is able to say a few words which are appropriate for certain questions.  He is in an overall cheerful mood.  He continues to say " waiting" when asked what he is doing and how he feels.   Objective:  Vital signs in last 24 hours: Vitals:   09/25/18 1552 09/25/18 1930 09/25/18 2336 09/26/18 0318  BP: 107/74 118/79 122/67 129/60  Pulse: 60 (!) 55 (!) 59 (!) 55  Resp: 18 18 20 18   Temp: 98.3 F (36.8 C) 98 F (36.7 C) 98.3 F (36.8 C) 98.1 F (36.7 C)  TempSrc: Oral Oral Oral Oral  SpO2: 97% 97% 98% 98%  Weight:      Height:       Physical Exam  Constitutional: He appears well-developed and well-nourished.  HENT:  Head: Normocephalic and atraumatic.  Eyes: EOM are normal.  Neck: Normal range of motion.  Cardiovascular: Normal rate, regular rhythm and normal heart sounds.  Pulmonary/Chest: Effort normal and breath sounds normal.  Musculoskeletal: He exhibits no edema or tenderness.  Neurological: He is alert.  Skin: Skin is warm and dry.  Psychiatric: He has a normal mood and affect.  Nursing note and vitals reviewed.   Assessment/Plan:  Principal Problem:   CVA (cerebral vascular accident) [facility]) Active Problems:   Persistent atrial fibrillation   Global aphasia   Bipolar 1 disorder (HCC)   Alcoholism (HCC)   Essential hypertension   Hyperlipidemia LDL goal <70  Arthur [name] is a 62 year old male with known atrial fibrillation who presented with aphasia and found to have left ACA/MCA territory infarct with bilateral white matter changes.Etiology likely embolism from atrial fibrillation. Awaiting SNF placement.  Transitions of care pharmacy has agreed to  provide his Biktarvy with no co-pay.  Left ACA/MCA Infarct 1. ContinueASA81mg  PO 2.Continuehome Xarelto 20 mg QD 3. Continue rosuvastatin 40 mg QD 4. Continue PT/OT/ST  A-fib:Currently in NSR 1.Continuehome sotalol 80 mg BID  Dispo:  Anticipated discharge in approximately2-3 days to SNF.  Synetta Shadow, MD 09/26/2018, 6:34 AM Pager: [phone]

## 2018-09-26 NOTE — Telephone Encounter (Signed)
Received a call from Marisue Ivan, Child psychotherapist at [facility], regarding this patient's HIV medication, Biktarvy.  I was able to get him an immediate 30 day supply of Biktarvy through [facility] patient assistance. Irving Burton, ID pharmacist at [facility], will obtain [facility] and bring to patient before he is discharged.  Information for Biktarvy Rx: ID: [id number] BIN: [id number] Group: [id number] PCN: [id number]

## 2018-09-26 NOTE — Progress Notes (Signed)
Physical Therapy Treatment Patient Details Name: Arthur [name] MRN: [id number] DOB: 11-Oct-1956 Today's Date: 09/26/2018    History of Present Illness Pt is a 62 y/o male with PMH of HIV, alcohol abuse, HTN, bipolar disease, atrial fibrillation on chronic Xarelot, and history of TIA who presented expressive/receptive aphasia and intermittent confusion. Pt found to have multiple Lt sided subacute infarcts - embolic -left caudate head subacute hemorrhagic infarct likely from afib with sub therapeutic INR.    PT Comments    Pt making steady progress with functional mobility. Continues to be very limited secondary to cognitive deficits and poor safety awareness. Continuing to recommend pt d/c to SNF for further therapy services.   Follow Up Recommendations  SNF     Equipment Recommendations  None recommended by PT    Recommendations for Other Services       Precautions / Restrictions Precautions Precautions: Fall Restrictions Weight Bearing Restrictions: No    Mobility  Bed Mobility Overal bed mobility: Modified Independent                Transfers Overall transfer level: Needs assistance Equipment used: None Transfers: Sit to/from Stand Sit to Stand: Supervision         General transfer comment: assist for safety  Ambulation/Gait Ambulation/Gait assistance: Min assist;Min guard Gait Distance (Feet): (hallway ambulation) Assistive device: None Gait Pattern/deviations: Step-through pattern;Decreased stride length;Drifts right/left Gait velocity: decreased   General Gait Details: attempted to work on safety awareness with ambulation this session; however, pt continues to required frequent cueing. Also with difficulty finding his room at the end of this session (when this has not been an issue previously)   Stairs Stairs: Yes Stairs assistance: Min guard Stair Management: Two rails;Alternating pattern;Forwards Number of Stairs: 2(2 full steps x1, 3 half steps  x1) General stair comments: improved stability with use of bilateral hand rails and min guard for safety   Wheelchair Mobility    Modified Rankin (Stroke Patients Only) Modified Rankin (Stroke Patients Only) Pre-Morbid Rankin Score: No symptoms Modified Rankin: Moderately severe disability     Balance Overall balance assessment: Needs assistance Sitting-balance support: Feet supported;No upper extremity supported Sitting balance-Leahy Scale: Good     Standing balance support: No upper extremity supported Standing balance-Leahy Scale: Fair               High level balance activites: Side stepping;Backward walking;Direction changes;Head turns High Level Balance Comments: pt able to take side steps bilaterally and backwards walking with demonstration and min guard to min A for safety            Cognition Arousal/Alertness: Awake/alert Behavior During Therapy: WFL for tasks assessed/performed Overall Cognitive Status: Difficult to assess Area of Impairment: Following commands;Problem solving                       Following Commands: Follows one step commands inconsistently;Follows one step commands with increased time     Problem Solving: Requires verbal cues;Difficulty sequencing;Requires tactile cues        Exercises      General Comments        Pertinent Vitals/Pain Pain Assessment: Faces Faces Pain Scale: No hurt    Home Living                      Prior Function            PT Goals (current goals can now be found in the care plan section) Acute  Rehab PT Goals PT Goal Formulation: Patient unable to participate in goal setting Time For Goal Achievement: 10/10/18 Potential to Achieve Goals: Good(goals reassessed and updated on 10/16) Progress towards PT goals: Progressing toward goals    Frequency    Min 3X/week      PT Plan Current plan remains appropriate    Co-evaluation              AM-PAC PT "6 Clicks"  Daily Activity  Outcome Measure  Difficulty turning over in bed (including adjusting bedclothes, sheets and blankets)?: None Difficulty moving from lying on back to sitting on the side of the bed? : None Difficulty sitting down on and standing up from a chair with arms (e.g., wheelchair, bedside commode, etc,.)?: Unable Help needed moving to and from a bed to chair (including a wheelchair)?: A Little Help needed walking in hospital room?: A Little Help needed climbing 3-5 steps with a railing? : A Little 6 Click Score: 18    End of Session Equipment Utilized During Treatment: Gait belt Activity Tolerance: Patient tolerated treatment well Patient left: in bed;with call bell/phone within reach;with bed alarm set Nurse Communication: Mobility status PT Visit Diagnosis: Other abnormalities of gait and mobility (R26.89);Other symptoms and signs involving the nervous system (Z61.096)     Time: 0454-0981 PT Time Calculation (min) (ACUTE ONLY): 13 min  Charges:  $Gait Training: 8-22 mins                     Deborah Chalk, [place], DPT  Acute Rehabilitation Services Pager [phone] Office [phone]     Alessandra Bevels Dow Blahnik 09/26/2018, 1:48 PM

## 2018-09-26 NOTE — Progress Notes (Signed)
Per Note from [facility], patient's Susanne Borders will be available through the [facility] 10/17 with no copay. Spoke with Social Worker, Marisue Ivan and she is aware.   Thank you,   Sharin Mons, PharmD, BCPS Infectious Diseases Clinical Pharmacist Phone: [phone]

## 2018-09-27 DIAGNOSIS — Z7982 Long term (current) use of aspirin: Secondary | ICD-10-CM

## 2018-09-27 MED ORDER — ASPIRIN 81 MG PO TBEC: 81.0000 mg | DELAYED_RELEASE_TABLET | Freq: Every day | ORAL | 0 refills | Status: AC

## 2018-09-27 MED ORDER — SENNOSIDES-DOCUSATE SODIUM 8.6-50 MG PO TABS: 1.0000 | ORAL_TABLET | Freq: Two times a day (BID) | ORAL | 0 refills | Status: AC

## 2018-09-27 MED ORDER — ACETAMINOPHEN 325 MG PO TABS: 650.0000 mg | ORAL_TABLET | Freq: Four times a day (QID) | ORAL | 0 refills | Status: AC | PRN

## 2018-09-27 NOTE — Care Management Note (Signed)
Case Management Note  Patient Details  Name: Arthur [name] MRN: [id number] Date of Birth: 28-Feb-1956  Subjective/Objective:                    Action/Plan: Pt discharging to [facility] today. CM notified his friend Loraine Leriche and sister Okey Dupre. CM signing off.   Expected Discharge Date:  09/27/18               Expected Discharge Plan:  Skilled Nursing Facility  In-House Referral:  Clinical Social Work  Discharge planning Services  CM Consult  Post Acute Care Choice:    Choice offered to:     DME Arranged:    DME Agency:     HH Arranged:    HH Agency:     Status of Service:  Completed, signed off  If discussed at [employer], dates discussed:    Additional Comments:  Kermit Balo, RN 09/27/2018, 1:57 PM

## 2018-09-27 NOTE — Progress Notes (Signed)
   Subjective: No acute events overnight. He continues to have global aphasia and difficulty speaking appropriate sentences.   Objective:  Vital signs in last 24 hours: Vitals:   09/26/18 1531 09/26/18 1943 09/26/18 2336 09/27/18 0335  BP: 92/65 106/64 112/60 (!) 119/59  Pulse: 80 81 79 71  Resp: 18 18 18 18   Temp: 98.1 F (36.7 C) 98 F (36.7 C) 98.3 F (36.8 C) 98 F (36.7 C)  TempSrc: Oral Oral Oral Oral  SpO2: 98% 98% 97% 98%  Weight:      Height:       Physical Exam  Constitutional: He appears well-developed and well-nourished.  HENT:  Head: Normocephalic and atraumatic.  Eyes: EOM are normal.  Neck: Normal range of motion.  Cardiovascular: Normal rate, regular rhythm and normal heart sounds.  Pulmonary/Chest: Effort normal and breath sounds normal.  Musculoskeletal: He exhibits no edema or tenderness.  Neurological:  He is alert. He cannot describe the word "pencil" when asked to say the item. He cannot follow verbal commands.   Skin: Skin is warm and dry.  Psychiatric: He has a normal mood and affect. His behavior is normal.  Nursing note and vitals reviewed.   Assessment/Plan:  Principal Problem:   CVA (cerebral vascular accident) [facility]) Active Problems:   Persistent atrial fibrillation   Global aphasia   Bipolar 1 disorder (HCC)   Alcoholism (HCC)   Essential hypertension   Hyperlipidemia LDL goal <70  Arthur [name] is a 62 year old male with known atrial fibrillation who presented with aphasia and found to have left ACA/MCA territory infarct with bilateral white matter changes.Etiology likely embolism from atrial fibrillation. He is awaiting SNF placement.  Left ACA/MCA Infarct 1. ContinueASA81mg  PO 2.Continuehome Xarelto 20 mg QD 3. Continue rosuvastatin 40 mg QD 4. Continue PT/OT/ST  A-fib:Currently in NSR 1.Continuehome sotalol 80 mg BID  Dispo: Anticipated discharge in approximately2-3 days to SNF.  Synetta Shadow,  MD 09/27/2018, 6:13 AM Pager: [phone]

## 2018-09-27 NOTE — Clinical Social Work Placement (Signed)
Nurse to call report to [phone], Room 409A     CLINICAL SOCIAL WORK PLACEMENT  NOTE  Date:  09/27/2018  Patient Details  Name: Arthur [name] MRN: [id number] Date of Birth: 1956-05-07  Clinical Social Work is seeking post-discharge placement for this patient at the Skilled  Nursing Facility level of care (*CSW will initial, date and re-position this form in  chart as items are completed):  Yes   Patient/family provided with [facility] Clinical Social Work Department's list of facilities offering this level of care within the geographic area requested by the patient (or if unable, by the patient's family).  Yes   Patient/family informed of their freedom to choose among providers that offer the needed level of care, that participate in Medicare, Medicaid or managed care program needed by the patient, have an available bed and are willing to accept the patient.  Yes   Patient/family informed of [facility]'s ownership interest in [facility] and [facility], as well as of the fact that they are under no obligation to receive care at these facilities.  PASRR submitted to EDS on       PASRR number received on 09/27/18     Existing PASRR number confirmed on       FL2 transmitted to all facilities in geographic area requested by pt/family on 09/27/18     FL2 transmitted to all facilities within larger geographic area on       Patient informed that his/her managed care company has contracts with or will negotiate with certain facilities, including the following:        Yes   Patient/family informed of bed offers received.  Patient chooses bed at [facility]     Physician recommends and patient chooses bed at      Patient to be transferred to [place] on 09/27/18.  Patient to be transferred to facility by [facility]     Patient family notified on 09/27/18 of transfer.  Name of family member notified:  Rose     PHYSICIAN       Additional Comment:     _______________________________________________ Baldemar Lenis, LCSW 09/27/2018, 2:44 PM
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
## 2018-09-27 NOTE — Plan of Care (Signed)
Min assist with adls 

## 2018-10-01 ENCOUNTER — Encounter (INDEPENDENT_AMBULATORY_CARE_PROVIDER_SITE_OTHER): Payer: Self-pay | Admitting: *Deleted

## 2018-10-01 ENCOUNTER — Other Ambulatory Visit: Payer: Self-pay | Admitting: Pharmacist

## 2018-10-01 VITALS — BP 107/78 | HR 82

## 2018-10-01 DIAGNOSIS — Z006 Encounter for examination for normal comparison and control in clinical research program: Secondary | ICD-10-CM

## 2018-10-01 DIAGNOSIS — B20 Human immunodeficiency virus [HIV] disease: Secondary | ICD-10-CM

## 2018-10-01 MED ORDER — BICTEGRAVIR-EMTRICITAB-TENOFOV 50-200-25 MG PO TABS: 1.0000 | ORAL_TABLET | Freq: Every day | ORAL | 5 refills | Status: DC

## 2018-10-01 NOTE — Research (Signed)
[name] showed up today from his SNF. They had transported him for his research visit. He can follow a few commands but is unable to express himself or answer most questions appropriately. He is mobile and does not seem to have any deficits physically. He continues on [place] daily there. He denies any pain or weakness but difficult to evaluate. I scheduled him for an MD visit along with research at the next visit in January.

## 2018-10-02 ENCOUNTER — Encounter: Payer: Self-pay | Admitting: Infectious Disease

## 2018-10-09 NOTE — Telephone Encounter (Signed)
Medication review.    Laurell Josephs, RN

## 2018-10-17 MED FILL — BIKTARVY 50-200-25 MG TABS: 50-200-25 | 30 days supply | Qty: 30 | Fill #0

## 2018-11-12 ENCOUNTER — Telehealth: Payer: Self-pay | Admitting: Internal Medicine

## 2018-11-12 MED ORDER — ROSUVASTATIN CALCIUM 40 MG PO TABS: 40.0000 mg | ORAL_TABLET | Freq: Every day | ORAL | Status: DC

## 2018-11-12 NOTE — Telephone Encounter (Signed)
I called over to [facility], patient is still there, spoke with patient's nurse and discussed potential change to Crestor 40mg  QD.  They will discuss change with his current supervising MD and implement if he approves.  Also Sp[facility] about Potential study disqualification alert that Epic prompted when I attempted to change dose, he is now out of the study after his stroke- no issue.

## 2018-11-12 NOTE — Telephone Encounter (Signed)
-----   Message from Geni BersAmber H Carter, RN sent at 10/29/2018 12:05 PM EST ----- Regarding: RE: Statin Dose at discharge Not sure, you would need to contact the patient- I'm a stroke quality nurse and noticed the discrepancy on my inpatient chart review.   Thanks.  Amber ----- Message ----- From: Synetta ShadowPrince, Jamie M, MD Sent: 10/26/2018  11:11 AM EST To: Geni BersAmber H Carter, RN, Rosalin Hawkingebra H Stowe, RN, # Subject: RE: Statin Dose at discharge                   Amber,  Do I need to send in a new prescription for the higher dose of Crestor? If yes, to which pharmacy?  Best, Julian HyJamie Prince ----- Message ----- From: Gust RungHoffman, Alyha Marines C, DO Sent: 10/25/2018   1:30 PM EST To: Synetta ShadowJamie M Prince, MD Subject: RE: Statin Dose at discharge                   Asher MuirJamie, you may need to send in a new Rx for the crestor, but I am not sure to what pharmacy. ----- Message ----- From: Synetta ShadowPrince, Jamie M, MD Sent: 10/25/2018   1:11 PM EST To: Geni BersAmber H Carter, RN, Rosalin Hawkingebra H Stowe, RN, # Subject: RE: Statin Dose at discharge                   Amber,  Thank you for reaching out. Per chart review, it looks like he was on rosuvastatin 10 mg QD prior to his recent admission. The statin titrated up to 40 mg because his LDL was not at goal. He should have been kept on this medication (rosuvastatin 40 mg QD) at discharge but it seems to have been restarted on the lower dose. He should be on rosuvastatin 40 mg QD indefinitely.  Thanks!   Best, Julian HyJamie Prince MD  ----- Message ----- From: Geni Bersarter, Amber H, RN Sent: 10/25/2018  10:04 AM EST To: Rosalin Hawkingebra H Stowe, RN, Gust RungErik C Luretta Everly, DO, # Subject: Statin Dose at discharge                       Just wanted to clarify if there was a reason this patient was discharged on 10/17 on Crestor 10mg  daily. Per neuro recommendations, patient was on crestor 40 mg daily in the hospital, but was ordered to continue 10 mg daily at discharge.   Thanks! Thank you! Rosanna RandyAmber Carter, RN, Stroke Doctor, general practiceQuality  Coordinator, Stroke Center 10/25/2018 10:07 AM

## 2018-11-23 MED FILL — BIKTARVY 50-200-25 MG TABS: 50-200-25 | 30 days supply | Qty: 30 | Fill #1

## 2019-01-08 ENCOUNTER — Ambulatory Visit (INDEPENDENT_AMBULATORY_CARE_PROVIDER_SITE_OTHER): Payer: No Typology Code available for payment source | Admitting: Infectious Disease

## 2019-01-08 ENCOUNTER — Encounter: Payer: Self-pay | Admitting: Infectious Disease

## 2019-01-08 VITALS — BP 97/68 | HR 55 | Temp 98.0°F

## 2019-01-08 DIAGNOSIS — F1021 Alcohol dependence, in remission: Secondary | ICD-10-CM

## 2019-01-08 DIAGNOSIS — I10 Essential (primary) hypertension: Secondary | ICD-10-CM

## 2019-01-08 DIAGNOSIS — Z79899 Other long term (current) drug therapy: Secondary | ICD-10-CM

## 2019-01-08 DIAGNOSIS — F319 Bipolar disorder, unspecified: Secondary | ICD-10-CM

## 2019-01-08 DIAGNOSIS — Z9884 Bariatric surgery status: Secondary | ICD-10-CM

## 2019-01-08 DIAGNOSIS — Z23 Encounter for immunization: Secondary | ICD-10-CM

## 2019-01-08 DIAGNOSIS — I63331 Cerebral infarction due to thrombosis of right posterior cerebral artery: Secondary | ICD-10-CM

## 2019-01-08 DIAGNOSIS — F332 Major depressive disorder, recurrent severe without psychotic features: Secondary | ICD-10-CM

## 2019-01-08 DIAGNOSIS — B20 Human immunodeficiency virus [HIV] disease: Secondary | ICD-10-CM

## 2019-01-08 DIAGNOSIS — I2510 Atherosclerotic heart disease of native coronary artery without angina pectoris: Secondary | ICD-10-CM

## 2019-01-08 DIAGNOSIS — I69320 Aphasia following cerebral infarction: Secondary | ICD-10-CM

## 2019-01-08 NOTE — Progress Notes (Signed)
Chief complaints: There for follow-up for HIV disease  Subjective:    Patient ID: Arthur [name], male    DOB: [birth year], 63 y.o.   MRN: [id number]  HPI  63  year old with HIV disease, prior morbid obesity status post gastric bypass surgery, hypertension depression and history of alcoholism who unfortunately suffered a massive cerebrovascular accident which has left him with expressive aphasia.  He is [facility].  He is brought to our clinic by skilled nursing facility.  He appears to be highly adherent to his Arthur [name] which is on his medical record.  He does need to have labs renewed today and also to renew his HIV medication assistance program.  Arthur [name] was quite pleasant and seemed to try to engage though his expressive aphasia but limited what he could tell me in response to my questions.        Past Medical History:  Diagnosis Date  . Acute kidney injury (HCC) 09/11/2018  . AKI (acute kidney injury) (HCC) 09/11/2018  . Alcohol abuse 12/21/2016  . Atrial fibrillation (HCC)   . Bipolar disorder (HCC)   . CVA (cerebral vascular accident) (HCC) 09/11/2018  . Depression   . History of blood transfusion 12-12-1998  . History of radiation exposure 08/17/2017  . HIV (human immunodeficiency virus infection) (HCC)   . HTN (hypertension) 11/17/2016  . Hyperlipidemia LDL goal <70   . 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 Lactic acidosis 09/11/2018  . Late syphilis   . Lower back pain 11/25/2015  . 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 Syncope due to orthostatic hypotension 10/04/2017  . TIA (transient ischemic attack) 11/17/2016    Past Surgical History:  Procedure Laterality Date  . broken right leg Right 2000  . GASTRIC BYPASS  2012   [facility]    Family History  Problem Relation Age of Onset  . Heart disease Mother 1040       MI age 64.  Died age 64  . Stroke Mother   . Heart disease Father          Died age 4170s MI  . Heart disease Sister        No details      Social History   Socioeconomic History  . Marital status: Single    Spouse name: Not on file  . Number of children: Not on file  . Years of education: Not on file  . Highest education level: Not on file  Occupational History  . Not on file  Social Needs  . Financial resource strain: Not on file  . Food insecurity:    Worry: Not on file    Inability: Not on file  . Transportation needs:    Medical: Not on file    Non-medical: Not on file  Tobacco Use  . Smoking status: Never Smoker  . Smokeless tobacco: Never Used  Substance and Sexual Activity  . Alcohol use: No    Comment: no EtOH in 20 days 09/26/17  . Drug use: No  . Sexual activity: Not Currently    Partners: Male  Lifestyle  . Physical activity:    Days per week: Not on file    Minutes per session: Not on file  . Stress: Not on file  Relationships  . Social connections:    Talks on phone: Not on file    Gets together: Not on file    Attends religious service: Not on file  Active member of club or organization: Not on file    Attends meetings of clubs or organizations: Not on file    Relationship status: Not on file  Other Topics Concern  . 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    No Known Allergies   Current Outpatient Medications:  .  ARIPiprazole (ABILIFY) 15 MG tablet, Take 15 mg by mouth daily., Disp: , Rfl:  .  aspirin 81 MG EC tablet, Take 1 tablet (81 mg total) by mouth daily., Disp: 30 tablet, Rfl: 0 .  bictegravir-emtricitabine-tenofovir AF (BIKTARVY) 50-200-25 MG TABS tablet, Take 1 tablet by mouth daily., Disp: 30 tablet, Rfl: 5 .  DULoxetine (CYMBALTA) 60 MG capsule, TAKE 2 CAPSULES (120 MG) BY MOUTH DAILY (Patient taking differently: Take 120 mg by mouth daily. ), Disp: 60 capsule, Rfl: 3 .  ferrous sulfate 325 (65 FE) MG EC tablet, Take 325 mg by mouth daily., Disp: , Rfl:  .  mirtazapine  (REMERON) 15 MG tablet, TAKE 1 TABLET(15 MG) BY MOUTH AT BEDTIME FOR DEPRESSION OR SLEEP (Patient taking differently: Take 15 mg by mouth at bedtime. ), Disp: 30 tablet, Rfl: 5 .  rivaroxaban (XARELTO) 20 MG TABS tablet, Take 1 tablet (20 mg total) by mouth daily with supper. [facility], Disp: 30 tablet, Rfl: 11 .  rosuvastatin (CRESTOR) 40 MG tablet, Take 1 tablet (40 mg total) by mouth daily. For high cholesterol, Disp: , Rfl:  .  senna-docusate (SENOKOT-S) 8.6-50 MG tablet, Take 1 tablet by mouth 2 (two) times daily., Disp: 60 tablet, Rfl: 0 .  sotalol (BETAPACE) 80 MG tablet, Take 1 tablet (80 mg total) by mouth every 12 (twelve) hours., Disp: 60 tablet, Rfl: 5 .  tamsulosin (FLOMAX) 0.4 MG CAPS capsule, Take 1 capsule (0.4 mg total) by mouth 2 (two) times daily. For prostate health, Disp: 60 capsule, Rfl: 11 .  topiramate (TOPAMAX) 50 MG tablet, Take 50 mg by mouth at bedtime. , Disp: , Rfl:    Review of Systems  Unable to perform ROS: Other  Constitutional: Negative for activity change, appetite change, diaphoresis, fatigue and unexpected weight change.  HENT: Negative for congestion, rhinorrhea, sinus pressure, sneezing, sore throat and trouble swallowing.   Eyes: Negative for photophobia and visual disturbance.  Respiratory: Negative for chest tightness, shortness of breath, wheezing and stridor.   Cardiovascular: Positive for palpitations. Negative for chest pain and leg swelling.  Gastrointestinal: Negative for abdominal distention, anal bleeding, blood in stool, constipation and nausea.  Genitourinary: Negative for difficulty urinating, discharge, dysuria, flank pain and hematuria.  Musculoskeletal: Negative for back pain, gait problem and joint swelling.  Skin: Negative for color change, pallor, rash and wound.  Neurological: Negative for dizziness, tremors, weakness and light-headedness.  Hematological: Negative for adenopathy. Does not bruise/bleed easily.    Psychiatric/Behavioral: Positive for dysphoric mood. Negative for agitation, behavioral problems, confusion, decreased concentration, self-injury and suicidal ideas. The patient is nervous/anxious.        Objective:   Physical Exam  Constitutional: He is oriented to person, place, and time. He appears well-developed and well-nourished. No distress.  HENT:  Head: Normocephalic and atraumatic.  Mouth/Throat: Oropharynx is clear and moist.  Eyes: Pupils are equal, round, and reactive to light. Conjunctivae and EOM are normal. No scleral icterus.  Neck: Normal range of motion. Neck supple. No JVD present.  Cardiovascular: Normal rate and regular rhythm.  Pulmonary/Chest: Effort normal and breath sounds normal. No respiratory distress. He has no wheezes.  Abdominal: Soft. Bowel sounds are normal. He exhibits no distension. There is no abdominal tenderness.  Musculoskeletal: Normal range of motion.        General: No tenderness or edema.     Left hip: Normal.     Lumbar back: He exhibits normal range of motion, no tenderness and no bony tenderness.  Neurological: He is alert and oriented to person, place, and time. He has normal reflexes. He exhibits normal muscle tone. Coordination normal.  Skin: Skin is warm and dry. No rash noted. He is not diaphoretic. No erythema. No pallor.  Psychiatric: He has a normal mood and affect. His behavior is normal.  Expressive aphasia          Assessment & Plan:    HIV disease: continue  BIKTARVY and check labs today and renew HMAP  CVA with expressive aphasia: Continue supportive care he does seem to be in good spirits.  Depression with bipolar disorder and also hx of alcoholism.  Seems to be less of an issue at least difficult to assess he seems quite pleasant today when I interacted with him   post gastric bypass surgery hypoglycemia: he needs to adhere to strict diet which hopefully is doing in the skilled nurse facility and also taking  acarbose  CAD: followed by  Dr. Antoine Poche

## 2019-01-09 ENCOUNTER — Encounter: Payer: Self-pay | Admitting: Infectious Disease

## 2019-01-09 LAB — T-HELPER CELL (CD4) - (RCID CLINIC ONLY)
CD4 % Helper T Cell: 40 % (ref 33–55)
CD4 T Cell Abs: 690 /uL (ref 400–2700)

## 2019-01-09 LAB — URINE CYTOLOGY ANCILLARY ONLY
Chlamydia: NEGATIVE
Neisseria Gonorrhea: NEGATIVE

## 2019-01-10 LAB — CBC WITH DIFFERENTIAL/PLATELET
Absolute Monocytes: 281 cells/uL (ref 200–950)
BASOS PCT: 0.6 %
Basophils Absolute: 22 cells/uL (ref 0–200)
Eosinophils Absolute: 122 cells/uL (ref 15–500)
Eosinophils Relative: 3.4 %
HCT: 35.6 % — ABNORMAL LOW (ref 38.5–50.0)
Hemoglobin: 11.7 g/dL — ABNORMAL LOW (ref 13.2–17.1)
Lymphs Abs: 1724 cells/uL (ref 850–3900)
MCH: 33 pg (ref 27.0–33.0)
MCHC: 32.9 g/dL (ref 32.0–36.0)
MCV: 100.3 fL — ABNORMAL HIGH (ref 80.0–100.0)
MONOS PCT: 7.8 %
MPV: 11 fL (ref 7.5–12.5)
Neutro Abs: 1451 cells/uL — ABNORMAL LOW (ref 1500–7800)
Neutrophils Relative %: 40.3 %
PLATELETS: 145 10*3/uL (ref 140–400)
RBC: 3.55 10*6/uL — AB (ref 4.20–5.80)
RDW: 14 % (ref 11.0–15.0)
TOTAL LYMPHOCYTE: 47.9 %
WBC: 3.6 10*3/uL — ABNORMAL LOW (ref 3.8–10.8)

## 2019-01-10 LAB — RPR: RPR: NONREACTIVE

## 2019-01-10 LAB — COMPLETE METABOLIC PANEL WITH GFR
AG RATIO: 1.5 (calc) (ref 1.0–2.5)
ALBUMIN MSPROF: 3.6 g/dL (ref 3.6–5.1)
ALT: 26 U/L (ref 9–46)
AST: 32 U/L (ref 10–35)
Alkaline phosphatase (APISO): 64 U/L (ref 40–115)
BILIRUBIN TOTAL: 0.4 mg/dL (ref 0.2–1.2)
BUN: 12 mg/dL (ref 7–25)
CALCIUM: 8.9 mg/dL (ref 8.6–10.3)
CO2: 21 mmol/L (ref 20–32)
Chloride: 111 mmol/L — ABNORMAL HIGH (ref 98–110)
Creat: 1.11 mg/dL (ref 0.70–1.25)
GFR, EST AFRICAN AMERICAN: 81 mL/min/{1.73_m2} (ref >=60)
GFR, EST NON AFRICAN AMERICAN: 70 mL/min/{1.73_m2} (ref >=60)
GLOBULIN: 2.4 g/dL (ref 1.9–3.7)
GLUCOSE: 77 mg/dL (ref 65–99)
Potassium: 4.6 mmol/L (ref 3.5–5.3)
SODIUM: 139 mmol/L (ref 135–146)
TOTAL PROTEIN: 6 g/dL — AB (ref 6.1–8.1)

## 2019-01-10 LAB — LIPID PANEL
CHOL/HDL RATIO: 2.2 (calc) (ref <5.0)
CHOLESTEROL: 102 mg/dL (ref <200)
HDL: 46 mg/dL (ref >40)
LDL CHOLESTEROL (CALC): 43 mg/dL
NON-HDL CHOLESTEROL (CALC): 56 mg/dL (ref <130)
Triglycerides: 57 mg/dL (ref <150)

## 2019-01-10 LAB — HIV-1 RNA QUANT-NO REFLEX-BLD
HIV 1 RNA QUANT: 29 {copies}/mL — AB
HIV-1 RNA Quant, Log: 1.46 Log copies/mL — ABNORMAL HIGH

## 2019-01-30 ENCOUNTER — Telehealth: Payer: Self-pay | Admitting: Pharmacy Technician

## 2019-01-30 NOTE — Telephone Encounter (Signed)
[facility] Patient Advocate Encounter  Patient discharged from the hospital and is currently residing at [facility].  Spoke his nurse, Wilkie Aye, he receives all of his medications daily from their in-house pharmacy.  Currently taking on a daily basis: Abilify Biktarvy  Crestor Sennakot,  Aspirin.    His Biktarvy was previously filled at [facility] and mailed to the facility but will now be dispensed daily with his regular medications from [facility].   Beulah Gandy, CPhT Specialty Pharmacy Patient [facility] Phone: [phone] Fax: [phone] 01/30/2019 11:46 AM

## 2019-04-12 NOTE — Telephone Encounter (Signed)
A user error has taken place: encounter opened in error, closed for administrative reasons.

## 2019-07-09 ENCOUNTER — Ambulatory Visit: Payer: Medicaid Other | Admitting: Infectious Disease

## 2019-10-08 NOTE — Telephone Encounter (Signed)
[facility] Patient Advocate Encounter  [facility] faxed a withdrawal letter indicating that the patient has been removed from the program on 09/27/2019.

## 2021-11-19 ENCOUNTER — Other Ambulatory Visit (HOSPITAL_COMMUNITY)
Admission: RE | Admit: 2021-11-19 | Discharge: 2021-11-19 | Disposition: A | Discharge status: Home / Self Care | Payer: Medicare Other | Source: Ambulatory Visit | Attending: Infectious Disease | Admitting: Infectious Disease

## 2021-11-19 ENCOUNTER — Ambulatory Visit (INDEPENDENT_AMBULATORY_CARE_PROVIDER_SITE_OTHER): Payer: Medicare Other | Admitting: Infectious Disease

## 2021-11-19 ENCOUNTER — Encounter: Payer: Self-pay | Admitting: Infectious Disease

## 2021-11-19 ENCOUNTER — Other Ambulatory Visit: Payer: Self-pay

## 2021-11-19 VITALS — BP 130/82 | HR 96 | Temp 98.6°F | Wt 180.0 lb

## 2021-11-19 DIAGNOSIS — E785 Hyperlipidemia, unspecified: Secondary | ICD-10-CM | POA: Insufficient documentation

## 2021-11-19 DIAGNOSIS — I10 Essential (primary) hypertension: Secondary | ICD-10-CM

## 2021-11-19 DIAGNOSIS — I63331 Cerebral infarction due to thrombosis of right posterior cerebral artery: Secondary | ICD-10-CM | POA: Diagnosis not present

## 2021-11-19 DIAGNOSIS — B20 Human immunodeficiency virus [HIV] disease: Secondary | ICD-10-CM | POA: Diagnosis present

## 2021-11-19 MED ORDER — BIKTARVY 50-200-25 MG PO TABS: 1.0000 | ORAL_TABLET | Freq: Every day | ORAL | 11 refills | Status: DC

## 2021-11-19 NOTE — Progress Notes (Signed)
Chief complaints: Arthur [name] is here for follow-up for his HIV disease on medication  Subjective:    Patient ID: Arthur [name], male    DOB: [birth year], 65 y.o.   MRN: [id number]  HPI  65 year old with HIV disease, prior morbid obesity status post gastric bypass surgery, hypertension depression and history of alcoholism who unfortunately suffered a massive cerebrovascular accident which has left him with expressive aphasia.  He continues to reside in [facility].  He is brought to our clinic by skilled nursing facility.  He has not been seen actually been our clinic for nearly 2 years.  He believes he is up-to-date on COVID-19 vaccination and flu shot.  He is still suffering from apparent expressive aphasia though he is able to talk to me more today.  He tells me he is eager to leave the facility he is in currently.          Past Medical History:  Diagnosis Date   Acute kidney injury (HCC) 09/11/2018   AKI (acute kidney injury) (HCC) 09/11/2018   Alcohol abuse 12/21/2016   Atrial fibrillation (HCC)    Bipolar disorder (HCC)    CVA (cerebral vascular accident) (HCC) 09/11/2018   Depression    History of blood transfusion 12-12-1998   History of radiation exposure 08/17/2017   HIV (human immunodeficiency virus infection) (HCC)    HTN (hypertension) 11/17/2016   Hyperlipidemia LDL goal <70    Hypoglycemia after GI (gastrointestinal) surgery 06/20/2016   Lactic acidosis 09/11/2018   Late syphilis    Lower back pain 11/25/2015   Recurrent major depression-severe (HCC) 06/20/2016   Syncope due to orthostatic hypotension 10/04/2017   TIA (transient ischemic attack) 11/17/2016    Past Surgical History:  Procedure Laterality Date   broken right leg Right 2000   GASTRIC BYPASS  2012   [facility]    Family History  Problem Relation Age of Onset   Heart disease Mother 42       MI age 87.  Died age 99   Stroke Mother    Heart disease Father        Died age 57s  MI   Heart disease Sister        No details      Social History   Socioeconomic History   Marital status: Single    Spouse name: Not on file   Number of children: Not on file   Years of education: Not on file   Highest education level: Not on file  Occupational History   Not on file  Tobacco Use   Smoking status: Never   Smokeless tobacco: Never  Vaping Use   Vaping Use: Never used  Substance and Sexual Activity   Alcohol use: No    Comment: no EtOH in 20 days 09/26/17   Drug use: No   Sexual activity: Not Currently    Partners: Male  Other Topics Concern   Not on file  Social History Narrative   Retired Child psychotherapist.   Social Determinants of Health   Financial Resource Strain: Not on file  Food Insecurity: Not on file  Transportation Needs: Not on file  Physical Activity: Not on file  Stress: Not on file  Social Connections: Not on file    No Known Allergies   Current Outpatient Medications:    ARIPiprazole (ABILIFY) 15 MG tablet, Take 15 mg by mouth daily., Disp: , Rfl:    aspirin 81 MG EC tablet, Take 1 tablet (81  mg total) by mouth daily., Disp: 30 tablet, Rfl: 0   bictegravir-emtricitabine-tenofovir AF (BIKTARVY) 50-200-25 MG TABS tablet, Take 1 tablet by mouth daily., Disp: 30 tablet, Rfl: 5   DULoxetine (CYMBALTA) 60 MG capsule, TAKE 2 CAPSULES (120 MG) BY MOUTH DAILY (Patient taking differently: Take 120 mg by mouth daily. ), Disp: 60 capsule, Rfl: 3   ferrous sulfate 325 (65 FE) MG EC tablet, Take 325 mg by mouth daily., Disp: , Rfl:    mirtazapine (REMERON) 15 MG tablet, TAKE 1 TABLET(15 MG) BY MOUTH AT BEDTIME FOR DEPRESSION OR SLEEP (Patient taking differently: Take 15 mg by mouth at bedtime. ), Disp: 30 tablet, Rfl: 5   rivaroxaban (XARELTO) 20 MG TABS tablet, Take 1 tablet (20 mg total) by mouth daily with supper. [facility], Disp: 30 tablet, Rfl: 11   rosuvastatin (CRESTOR) 40 MG tablet, Take 1 tablet (40 mg total) by mouth daily. For high  cholesterol, Disp: , Rfl:    senna-docusate (SENOKOT-S) 8.6-50 MG tablet, Take 1 tablet by mouth 2 (two) times daily., Disp: 60 tablet, Rfl: 0   sotalol (BETAPACE) 80 MG tablet, Take 1 tablet (80 mg total) by mouth every 12 (twelve) hours., Disp: 60 tablet, Rfl: 5   tamsulosin (FLOMAX) 0.4 MG CAPS capsule, Take 1 capsule (0.4 mg total) by mouth 2 (two) times daily. For prostate health, Disp: 60 capsule, Rfl: 11   topiramate (TOPAMAX) 50 MG tablet, Take 50 mg by mouth at bedtime. , Disp: , Rfl:    Review of Systems  Unable to perform ROS: Other      Objective:   Physical Exam Constitutional:      Appearance: He is well-developed.  HENT:     Head: Normocephalic and atraumatic.  Eyes:     Conjunctiva/sclera: Conjunctivae normal.  Cardiovascular:     Rate and Rhythm: Normal rate and regular rhythm.  Pulmonary:     Effort: Pulmonary effort is normal. No respiratory distress.     Breath sounds: No wheezing.  Abdominal:     General: There is no distension.     Palpations: Abdomen is soft.  Musculoskeletal:        General: No tenderness. Normal range of motion.     Cervical back: Normal range of motion and neck supple.  Skin:    General: Skin is warm and dry.     Coloration: Skin is not pale.     Findings: No erythema or rash.  Neurological:     Mental Status: He is alert and oriented to person, place, and time.  Psychiatric:        Attention and Perception: Attention normal.        Mood and Affect: Mood normal.        Speech: Speech is delayed.        Behavior: Behavior normal. Behavior is cooperative.        Cognition and Memory: Memory is impaired. He exhibits impaired recent memory and impaired remote memory.     Comments: He still has apparent expressive aphasia          Assessment & Plan:   HIV disease:  I am checking a viral load CD4 count CMP and CBC with differential  I am continue his BIKTARVY prescription which have sent to [facility] which is the pharmacy  of record for [facility].  Stroke with expressive aphasia: Continue supportive care and counseling  Continue statin blood pressure control  Coronary artery disease: Followed by cardiology.  Come back to see me in 6 months time.

## 2021-11-19 NOTE — Addendum Note (Signed)
Addended by: Harley Alto on: 11/19/2021 10:17 AM   Modules accepted: Orders

## 2021-11-22 LAB — URINE CYTOLOGY ANCILLARY ONLY
Chlamydia: NEGATIVE
Comment: NEGATIVE
Comment: NORMAL
Neisseria Gonorrhea: NEGATIVE

## 2021-11-22 LAB — CBC WITH DIFFERENTIAL/PLATELET
Absolute Monocytes: 432 cells/uL (ref 200–950)
Basophils Absolute: 32 cells/uL (ref 0–200)
Basophils Relative: 0.6 %
Eosinophils Absolute: 103 cells/uL (ref 15–500)
Eosinophils Relative: 1.9 %
HCT: 39.5 % (ref 38.5–50.0)
Hemoglobin: 12.6 g/dL — ABNORMAL LOW (ref 13.2–17.1)
Lymphs Abs: 3094 cells/uL (ref 850–3900)
MCH: 30.6 pg (ref 27.0–33.0)
MCHC: 31.9 g/dL — ABNORMAL LOW (ref 32.0–36.0)
MCV: 95.9 fL (ref 80.0–100.0)
MPV: 10.7 fL (ref 7.5–12.5)
Monocytes Relative: 8 %
Neutro Abs: 1739 cells/uL (ref 1500–7800)
Neutrophils Relative %: 32.2 %
Platelets: 193 10*3/uL (ref 140–400)
RBC: 4.12 10*6/uL — ABNORMAL LOW (ref 4.20–5.80)
RDW: 13.5 % (ref 11.0–15.0)
Total Lymphocyte: 57.3 %
WBC: 5.4 10*3/uL (ref 3.8–10.8)

## 2021-11-22 LAB — LIPID PANEL
Cholesterol: 117 mg/dL (ref <200)
HDL: 51 mg/dL (ref >=40)
LDL Cholesterol (Calc): 52 mg/dL (calc)
Non-HDL Cholesterol (Calc): 66 mg/dL (calc) (ref <130)
Total CHOL/HDL Ratio: 2.3 (calc) (ref <5.0)
Triglycerides: 55 mg/dL (ref <150)

## 2021-11-22 LAB — T-HELPER CELLS (CD4) COUNT (NOT AT ARMC)
Absolute CD4: 1037 cells/uL (ref 490–1740)
CD4 T Helper %: 34 % (ref 30–61)
Total lymphocyte count: 3060 cells/uL (ref 850–3900)

## 2021-11-22 LAB — COMPLETE METABOLIC PANEL WITH GFR
AG Ratio: 1.4 (calc) (ref 1.0–2.5)
ALT: 15 U/L (ref 9–46)
AST: 18 U/L (ref 10–35)
Albumin: 4.1 g/dL (ref 3.6–5.1)
Alkaline phosphatase (APISO): 70 U/L (ref 35–144)
BUN: 13 mg/dL (ref 7–25)
CO2: 23 mmol/L (ref 20–32)
Calcium: 9.2 mg/dL (ref 8.6–10.3)
Chloride: 107 mmol/L (ref 98–110)
Creat: 1.19 mg/dL (ref 0.70–1.35)
Globulin: 2.9 g/dL (calc) (ref 1.9–3.7)
Glucose, Bld: 120 mg/dL — ABNORMAL HIGH (ref 65–99)
Potassium: 4 mmol/L (ref 3.5–5.3)
Sodium: 138 mmol/L (ref 135–146)
Total Bilirubin: 0.6 mg/dL (ref 0.2–1.2)
Total Protein: 7 g/dL (ref 6.1–8.1)
eGFR: 67 mL/min/{1.73_m2} (ref >=60)

## 2021-11-22 LAB — RPR: RPR Ser Ql: NONREACTIVE

## 2021-11-22 LAB — HIV-1 RNA QUANT-NO REFLEX-BLD
HIV 1 RNA Quant: <20 Copies/mL — ABNORMAL HIGH
HIV-1 RNA Quant, Log: <1.3 Log cps/mL — ABNORMAL HIGH

## 2022-06-01 ENCOUNTER — Ambulatory Visit: Payer: Medicare Other | Admitting: Infectious Disease

## 2022-06-06 ENCOUNTER — Encounter: Payer: Self-pay | Admitting: Infectious Disease

## 2022-06-06 ENCOUNTER — Ambulatory Visit (INDEPENDENT_AMBULATORY_CARE_PROVIDER_SITE_OTHER): Payer: Medicare Other | Admitting: Infectious Disease

## 2022-06-06 ENCOUNTER — Other Ambulatory Visit: Payer: Self-pay

## 2022-06-06 ENCOUNTER — Telehealth: Payer: Self-pay

## 2022-06-06 ENCOUNTER — Other Ambulatory Visit (HOSPITAL_COMMUNITY)
Admission: RE | Admit: 2022-06-06 | Discharge: 2022-06-06 | Disposition: A | Discharge status: Home / Self Care | Payer: Medicare Other | Source: Ambulatory Visit | Attending: Infectious Disease | Admitting: Infectious Disease

## 2022-06-06 VITALS — BP 104/73 | HR 80 | Temp 97.9°F | Ht 69.0 in | Wt 186.0 lb

## 2022-06-06 DIAGNOSIS — E785 Hyperlipidemia, unspecified: Secondary | ICD-10-CM | POA: Insufficient documentation

## 2022-06-06 DIAGNOSIS — I10 Essential (primary) hypertension: Secondary | ICD-10-CM | POA: Diagnosis present

## 2022-06-06 DIAGNOSIS — R4701 Aphasia: Secondary | ICD-10-CM

## 2022-06-06 DIAGNOSIS — A529 Late syphilis, unspecified: Secondary | ICD-10-CM | POA: Diagnosis present

## 2022-06-06 DIAGNOSIS — B20 Human immunodeficiency virus [HIV] disease: Secondary | ICD-10-CM

## 2022-06-06 DIAGNOSIS — I63331 Cerebral infarction due to thrombosis of right posterior cerebral artery: Secondary | ICD-10-CM | POA: Insufficient documentation

## 2022-06-06 LAB — CBC WITH DIFFERENTIAL/PLATELET
Basophils Absolute: 31 cells/uL (ref 0–200)
Eosinophils Absolute: 143 cells/uL (ref 15–500)
Eosinophils Relative: 2.3 %
HCT: 40.5 % (ref 38.5–50.0)
Hemoglobin: 12.7 g/dL — ABNORMAL LOW (ref 13.2–17.1)
Lymphs Abs: 3863 cells/uL (ref 850–3900)
MCH: 29.6 pg (ref 27.0–33.0)
MCV: 94.4 fL (ref 80.0–100.0)
Neutro Abs: 1693 cells/uL (ref 1500–7800)
Platelets: 224 10*3/uL (ref 140–400)
RBC: 4.29 10*6/uL (ref 4.20–5.80)
RDW: 14.5 % (ref 11.0–15.0)
Total Lymphocyte: 62.3 %
WBC: 6.2 10*3/uL (ref 3.8–10.8)

## 2022-06-06 MED ORDER — BIKTARVY 50-200-25 MG PO TABS: 1.0000 | ORAL_TABLET | Freq: Every day | ORAL | 11 refills | Status: DC

## 2022-06-07 LAB — LIPID PANEL: Cholesterol: 115 mg/dL (ref <200)

## 2022-06-07 LAB — COMPLETE METABOLIC PANEL WITH GFR
ALT: 11 U/L (ref 9–46)
Albumin: 4.2 g/dL (ref 3.6–5.1)
Calcium: 9.1 mg/dL (ref 8.6–10.3)
Creat: 1.5 mg/dL — ABNORMAL HIGH (ref 0.70–1.35)
Sodium: 141 mmol/L (ref 135–146)
Total Protein: 6.8 g/dL (ref 6.1–8.1)
eGFR: 51 mL/min/{1.73_m2} — ABNORMAL LOW (ref >=60)

## 2022-06-07 LAB — URINE CYTOLOGY ANCILLARY ONLY
Chlamydia: NEGATIVE
Comment: NEGATIVE
Comment: NORMAL
Neisseria Gonorrhea: NEGATIVE

## 2022-06-07 LAB — T-HELPER CELLS (CD4) COUNT (NOT AT ARMC)
CD4 % Helper T Cell: 32 % — ABNORMAL LOW (ref 33–65)
CD4 T Cell Abs: 1121 /uL (ref 400–1790)

## 2022-06-08 ENCOUNTER — Telehealth: Payer: Self-pay

## 2022-06-08 LAB — LIPID PANEL
HDL: 47 mg/dL (ref >=40)
LDL Cholesterol (Calc): 54 mg/dL (calc)
Non-HDL Cholesterol (Calc): 68 mg/dL (calc) (ref <130)
Total CHOL/HDL Ratio: 2.4 (calc) (ref <5.0)
Triglycerides: 60 mg/dL (ref <150)

## 2022-06-08 LAB — CBC WITH DIFFERENTIAL/PLATELET
Absolute Monocytes: 471 cells/uL (ref 200–950)
Basophils Relative: 0.5 %
MCHC: 31.4 g/dL — ABNORMAL LOW (ref 32.0–36.0)
MPV: 10.9 fL (ref 7.5–12.5)
Monocytes Relative: 7.6 %
Neutrophils Relative %: 27.3 %

## 2022-06-08 LAB — COMPLETE METABOLIC PANEL WITH GFR
AG Ratio: 1.6 (calc) (ref 1.0–2.5)
AST: 15 U/L (ref 10–35)
Alkaline phosphatase (APISO): 81 U/L (ref 35–144)
BUN/Creatinine Ratio: 8 (calc) (ref 6–22)
BUN: 12 mg/dL (ref 7–25)
CO2: 23 mmol/L (ref 20–32)
Chloride: 110 mmol/L (ref 98–110)
Globulin: 2.6 g/dL (calc) (ref 1.9–3.7)
Glucose, Bld: 87 mg/dL (ref 65–99)
Potassium: 4.3 mmol/L (ref 3.5–5.3)
Total Bilirubin: 0.5 mg/dL (ref 0.2–1.2)

## 2022-06-08 LAB — HIV-1 RNA QUANT-NO REFLEX-BLD
HIV 1 RNA Quant: NOT DETECTED Copies/mL
HIV-1 RNA Quant, Log: NOT DETECTED Log cps/mL

## 2022-06-08 LAB — RPR: RPR Ser Ql: NONREACTIVE

## 2022-06-08 NOTE — Telephone Encounter (Signed)
-----   Message from Randall Hiss, MD sent at 06/07/2022  8:24 AM EDT ----- [facility] [place] creatinine is up a bit ----- Message ----- From: Janace Hoard Lab Results In Sent: 06/06/2022  10:59 PM EDT To: Randall Hiss, MD

## 2022-06-08 NOTE — Telephone Encounter (Signed)
Patient is a resident at [facility], called facility to speak with his nurse and make her aware of results. No answer, left voicemail requesting callback.  Sandie Ano, RN

## 2022-06-10 NOTE — Telephone Encounter (Signed)
Left vm x 2-asking facility to call [facility] Triage back to discuss results.

## 2022-06-13 NOTE — Telephone Encounter (Signed)
Attempted to reach patient's nurse at [facility]. Line continued to ring without answer, unable to leave voicemail.   Sandie Ano, RN

## 2022-06-15 NOTE — Telephone Encounter (Signed)
Spoke with Loletha Carrow, nurse at [facility]. Relayed that patient's creatinine was 1.50 on 06/06/22.  No new orders from provider, asked Shantel to make facility provider aware.   Sandie Ano, RN

## 2023-05-03 ENCOUNTER — Ambulatory Visit: Payer: Medicare Other | Admitting: Infectious Disease

## 2023-05-04 NOTE — Progress Notes (Signed)
Chief complaints: For HIV disease on medications Subjective:    Patient ID: Arthur [name], male    DOB: March 11, 1956, 67 y.o.   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  He continues to reside in [facility].  He is brought to our clinic by skilled nursing facility.   [name] seems to be improving as far as his aphasia and is able to interact and answer multiple questions.  He still is continuing to stay in the facility.  He has no complaints today        Past Medical History:  Diagnosis Date   Acute kidney injury (HCC) 09/11/2018   AKI (acute kidney injury) (HCC) 09/11/2018   Alcohol abuse 12/21/2016   Atrial fibrillation (HCC)    Bipolar disorder (HCC)    CVA (cerebral vascular accident) (HCC) 09/11/2018   Depression    History of blood transfusion 12-12-1998   History of radiation exposure 08/17/2017   HIV (human immunodeficiency virus infection) (HCC)    HTN (hypertension) 11/17/2016   Hyperlipidemia LDL goal <70    Hypoglycemia after GI (gastrointestinal) surgery 06/20/2016   Lactic acidosis 09/11/2018   Late syphilis    Lower back pain 11/25/2015   Recurrent major depression-severe (HCC) 06/20/2016   Syncope due to orthostatic hypotension 10/04/2017   TIA (transient ischemic attack) 11/17/2016    Past Surgical History:  Procedure Laterality Date   broken right leg Right 2000   GASTRIC BYPASS  2012   [facility]    Family History  Problem Relation Age of Onset   Heart disease Mother 68       MI age 58.  Died age 49   Stroke Mother    Heart disease Father        Died age 89s MI   Heart disease Sister        No details      Social History   Socioeconomic History   Marital status: Single    Spouse name: Not on file    Number of children: Not on file   Years of education: Not on file   Highest education level: Not on file  Occupational History   Not on file  Tobacco Use   Smoking status: Never   Smokeless tobacco: Never  Vaping Use   Vaping Use: Never used  Substance and Sexual Activity   Alcohol use: No    Comment: no EtOH in 20 days 09/26/17   Drug use: No   Sexual activity: Not Currently    Partners: Male  Other Topics Concern   Not on file  Social History Narrative   Retired Child psychotherapist.   Social Determinants of Health   Financial Resource Strain: Not on file  Food Insecurity: Not on file  Transportation Needs: Not on file  Physical Activity: Not on file  Stress: Not on file  Social Connections: Not on file    No Known Allergies   Current Outpatient Medications:    acetaminophen (TYLENOL) 325 MG tablet, Take 650 mg by mouth every 6 (six) hours as needed for mild pain or fever (Every 6 hours as needed for pain or temp >100. Don't Exceed 4 grams of tylenol in 24 hours)., Disp: , Rfl:    Acetaminophen 325 MG CAPS, Take 650 mg by mouth as needed (every  4 hours as needed for pain, headache, or fever > 100F)., Disp: , Rfl:    ARIPiprazole (ABILIFY) 15 MG tablet, Take 15 mg by mouth daily. (Patient not taking: Reported on 06/06/2022), Disp: , Rfl:    aspirin 81 MG EC tablet, Take 1 tablet (81 mg total) by mouth daily., Disp: 30 tablet, Rfl: 0   aspirin-acetaminophen-caffeine (EXCEDRIN MIGRAINE) 250-250-65 MG tablet, Take 1 tablet by mouth daily as needed for headache (Don't exceed 4 grams of tylenol in 24 hours)., Disp: , Rfl:    B COMPLEX VITAMINS PO, Take 1 tablet by mouth daily., Disp: , Rfl:    bictegravir-emtricitabine-tenofovir AF (BIKTARVY) 50-200-25 MG TABS tablet, Take 1 tablet by mouth daily., Disp: 30 tablet, Rfl: 11   buPROPion (WELLBUTRIN) 75 MG tablet, Take 75 mg by mouth every morning., Disp: , Rfl:    Calcium Carbonate Antacid (MAALOX PO), Take 30 mLs by mouth as needed  (Every 4 hours PRN for indigestion complaints)., Disp: , Rfl:    Cholecalciferol 50 MCG (2000 UT) TABS, Take 1 tablet by mouth daily., Disp: , Rfl:    cyanocobalamin 1000 MCG tablet, Take 1,000 mcg by mouth daily., Disp: , Rfl:    DENTA 5000 PLUS 1.1 % CREA dental cream, Take 1 Application by mouth daily., Disp: , Rfl:    DULoxetine (CYMBALTA) 20 MG capsule, Take 20 mg by mouth daily., Disp: , Rfl:    DULoxetine (CYMBALTA) 60 MG capsule, TAKE 2 CAPSULES (120 MG) BY MOUTH DAILY (Patient not taking: Reported on 06/06/2022), Disp: 60 capsule, Rfl: 3   Ferrous Gluconate 324 (37.5 Fe) MG TABS, Take 1 tablet by mouth 3 (three) times a week. Give 1 tablet by mouth one time a day every Mon, wed, Thur for supplementation for 6 weeks give w/ OJ, Disp: , Rfl:    ferrous sulfate 325 (65 FE) MG EC tablet, Take 325 mg by mouth daily. (Patient not taking: Reported on 06/06/2022), Disp: , Rfl:    mirtazapine (REMERON) 15 MG tablet, TAKE 1 TABLET(15 MG) BY MOUTH AT BEDTIME FOR DEPRESSION OR SLEEP (Patient not taking: Reported on 06/06/2022), Disp: 30 tablet, Rfl: 5   OLANZapine (ZYPREXA) 2.5 MG tablet, Take 2.5 mg by mouth at bedtime., Disp: , Rfl:    rivaroxaban (XARELTO) 20 MG TABS tablet, Take 1 tablet (20 mg total) by mouth daily with supper. [facility], Disp: 30 tablet, Rfl: 11   rosuvastatin (CRESTOR) 40 MG tablet, Take 1 tablet (40 mg total) by mouth daily. For high cholesterol (Patient not taking: Reported on 06/06/2022), Disp: , Rfl:    rosuvastatin (CRESTOR) 5 MG tablet, Take 5 mg by mouth daily., Disp: , Rfl:    senna-docusate (SENOKOT-S) 8.6-50 MG tablet, Take 1 tablet by mouth 2 (two) times daily., Disp: 60 tablet, Rfl: 0   Skin Protectants, Misc. (EUCERIN) cream, Apply topically as needed for dry skin (Apply to skin/feet/legs topically as needed for dry skin prn daily)., Disp: , Rfl:    sotalol (BETAPACE) 80 MG tablet, Take 1 tablet (80 mg total) by mouth every 12 (twelve) hours. (Patient not taking:  Reported on 06/06/2022), Disp: 60 tablet, Rfl: 5   tamsulosin (FLOMAX) 0.4 MG CAPS capsule, Take 1 capsule (0.4 mg total) by mouth 2 (two) times daily. For prostate health (Patient taking differently: Take 0.4 mg by mouth daily. Give 1 capsule by mouth one time a day every other day), Disp: 60 capsule, Rfl: 11   topiramate (TOPAMAX) 50 MG tablet, Take 100 mg by mouth at  bedtime., Disp: , Rfl:    Review of Systems  Unable to perform ROS: Other       Objective:   Physical Exam Constitutional:      Appearance: He is well-developed.  HENT:     Head: Normocephalic and atraumatic.  Eyes:     Conjunctiva/sclera: Conjunctivae normal.  Cardiovascular:     Rate and Rhythm: Normal rate and regular rhythm.  Pulmonary:     Effort: Pulmonary effort is normal. No respiratory distress.     Breath sounds: No wheezing.  Abdominal:     General: There is no distension.     Palpations: Abdomen is soft.  Musculoskeletal:        General: No tenderness. Normal range of motion.     Cervical back: Normal range of motion and neck supple.  Skin:    General: Skin is warm and dry.     Coloration: Skin is not pale.     Findings: No erythema or rash.  Neurological:     Mental Status: He is alert and oriented to person, place, and time.  Psychiatric:        Mood and Affect: Mood normal.        Behavior: Behavior normal.        Thought Content: Thought content normal.        Judgment: Judgment normal.           Assessment & Plan:  HIV disease:  I will add order HIV viral load CD4 count CBC with differential CMP, RPR GC and chlamydia and I will continue  [employer],  prescription   With expressive aphasia:seems to be improving  Hyperlipidemia we will continue his Crestor  Hypertension will continue sotalol.   I have personally spent 26  minutes involved in face-to-face and non-face-to-face activities for this patient on the day of the visit. Professional time spent includes the  following activities: Preparing to see the patient (review of tests), Obtaining and/or reviewing separately obtained history (admission/discharge record), Performing a medically appropriate examination and/or evaluation , Ordering medications/tests/procedures, referring and communicating with other health care professionals, Documenting clinical information in the EMR, Independently interpreting results (not separately reported), Communicating results to the patient/family/caregiver, Counseling and educating the patient/family/caregiver and Care coordination (not separately reported).

## 2023-05-05 ENCOUNTER — Other Ambulatory Visit: Payer: Self-pay

## 2023-05-05 ENCOUNTER — Encounter: Payer: Self-pay | Admitting: Infectious Disease

## 2023-05-05 ENCOUNTER — Ambulatory Visit (INDEPENDENT_AMBULATORY_CARE_PROVIDER_SITE_OTHER): Payer: Medicare Other | Admitting: Infectious Disease

## 2023-05-05 VITALS — BP 97/69 | HR 78 | Temp 97.7°F | Wt 188.0 lb

## 2023-05-05 DIAGNOSIS — R4701 Aphasia: Secondary | ICD-10-CM | POA: Diagnosis not present

## 2023-05-05 DIAGNOSIS — B20 Human immunodeficiency virus [HIV] disease: Secondary | ICD-10-CM | POA: Diagnosis present

## 2023-05-05 DIAGNOSIS — I63331 Cerebral infarction due to thrombosis of right posterior cerebral artery: Secondary | ICD-10-CM | POA: Diagnosis not present

## 2023-05-05 DIAGNOSIS — Z79899 Other long term (current) drug therapy: Secondary | ICD-10-CM

## 2023-05-05 DIAGNOSIS — Z113 Encounter for screening for infections with a predominantly sexual mode of transmission: Secondary | ICD-10-CM

## 2023-05-05 DIAGNOSIS — I10 Essential (primary) hypertension: Secondary | ICD-10-CM | POA: Diagnosis not present

## 2023-05-05 MED ORDER — BIKTARVY 50-200-25 MG PO TABS: 1.0000 | ORAL_TABLET | Freq: Every day | ORAL | 11 refills | Status: DC

## 2023-05-05 MED ORDER — BIKTARVY 50-200-25 MG PO TABS
1.0000 | ORAL_TABLET | Freq: Every day | ORAL | 11 refills | Status: DC
Start: 2023-05-05 — End: 2023-11-16

## 2023-05-09 LAB — T-HELPER CELLS (CD4) COUNT (NOT AT ARMC)
Absolute CD4: 1390 cells/uL (ref 490–1740)
CD4 T Helper %: 32 % (ref 30–61)
Total lymphocyte count: 4308 cells/uL — ABNORMAL HIGH (ref 850–3900)

## 2023-05-09 LAB — CBC WITH DIFFERENTIAL/PLATELET
Absolute Monocytes: 568 cells/uL (ref 200–950)
Basophils Absolute: 43 cells/uL (ref 0–200)
Basophils Relative: 0.6 %
Eosinophils Absolute: 178 cells/uL (ref 15–500)
Eosinophils Relative: 2.5 %
HCT: 37.9 % — ABNORMAL LOW (ref 38.5–50.0)
Hemoglobin: 11.8 g/dL — ABNORMAL LOW (ref 13.2–17.1)
Lymphs Abs: 4225 cells/uL — ABNORMAL HIGH (ref 850–3900)
MCH: 29.3 pg (ref 27.0–33.0)
MCHC: 31.1 g/dL — ABNORMAL LOW (ref 32.0–36.0)
MCV: 94 fL (ref 80.0–100.0)
MPV: 10.4 fL (ref 7.5–12.5)
Monocytes Relative: 8 %
Neutro Abs: 2087 cells/uL (ref 1500–7800)
Neutrophils Relative %: 29.4 %
Platelets: 234 10*3/uL (ref 140–400)
RBC: 4.03 10*6/uL — ABNORMAL LOW (ref 4.20–5.80)
RDW: 13.3 % (ref 11.0–15.0)
Total Lymphocyte: 59.5 %
WBC: 7.1 10*3/uL (ref 3.8–10.8)

## 2023-05-09 LAB — COMPLETE METABOLIC PANEL WITH GFR
AG Ratio: 1.7 (calc) (ref 1.0–2.5)
ALT: 11 U/L (ref 9–46)
AST: 15 U/L (ref 10–35)
Albumin: 4.1 g/dL (ref 3.6–5.1)
Alkaline phosphatase (APISO): 72 U/L (ref 35–144)
BUN: 13 mg/dL (ref 7–25)
CO2: 22 mmol/L (ref 20–32)
Calcium: 9.1 mg/dL (ref 8.6–10.3)
Chloride: 110 mmol/L (ref 98–110)
Creat: 1.29 mg/dL (ref 0.70–1.35)
Globulin: 2.4 g/dL (calc) (ref 1.9–3.7)
Glucose, Bld: 91 mg/dL (ref 65–99)
Potassium: 4.5 mmol/L (ref 3.5–5.3)
Sodium: 140 mmol/L (ref 135–146)
Total Bilirubin: 0.5 mg/dL (ref 0.2–1.2)
Total Protein: 6.5 g/dL (ref 6.1–8.1)
eGFR: 61 mL/min/{1.73_m2} (ref >=60)

## 2023-05-09 LAB — LIPID PANEL
Cholesterol: 110 mg/dL (ref <200)
HDL: 51 mg/dL (ref >=40)
LDL Cholesterol (Calc): 45 mg/dL (calc)
Non-HDL Cholesterol (Calc): 59 mg/dL (calc) (ref <130)
Total CHOL/HDL Ratio: 2.2 (calc) (ref <5.0)
Triglycerides: 55 mg/dL (ref <150)

## 2023-05-09 LAB — HIV-1 RNA QUANT-NO REFLEX-BLD
HIV 1 RNA Quant: <20 Copies/mL — ABNORMAL HIGH
HIV-1 RNA Quant, Log: <1.3 Log cps/mL — ABNORMAL HIGH

## 2023-05-09 LAB — RPR: RPR Ser Ql: NONREACTIVE

## 2023-11-06 ENCOUNTER — Ambulatory Visit: Payer: Medicare Other | Admitting: Infectious Disease

## 2023-11-06 DIAGNOSIS — I63331 Cerebral infarction due to thrombosis of right posterior cerebral artery: Secondary | ICD-10-CM

## 2023-11-06 DIAGNOSIS — E785 Hyperlipidemia, unspecified: Secondary | ICD-10-CM

## 2023-11-06 DIAGNOSIS — B20 Human immunodeficiency virus [HIV] disease: Secondary | ICD-10-CM

## 2023-11-06 DIAGNOSIS — A529 Late syphilis, unspecified: Secondary | ICD-10-CM

## 2023-11-16 ENCOUNTER — Other Ambulatory Visit: Payer: Self-pay

## 2023-11-16 ENCOUNTER — Encounter: Payer: Self-pay | Admitting: Infectious Disease

## 2023-11-16 ENCOUNTER — Ambulatory Visit: Payer: Medicare Other | Admitting: Infectious Disease

## 2023-11-16 VITALS — BP 110/81 | HR 99 | Temp 97.6°F | Wt 189.0 lb

## 2023-11-16 DIAGNOSIS — E785 Hyperlipidemia, unspecified: Secondary | ICD-10-CM | POA: Diagnosis not present

## 2023-11-16 DIAGNOSIS — B20 Human immunodeficiency virus [HIV] disease: Secondary | ICD-10-CM

## 2023-11-16 DIAGNOSIS — I69320 Aphasia following cerebral infarction: Secondary | ICD-10-CM | POA: Diagnosis not present

## 2023-11-16 DIAGNOSIS — I10 Essential (primary) hypertension: Secondary | ICD-10-CM

## 2023-11-16 DIAGNOSIS — I4819 Other persistent atrial fibrillation: Secondary | ICD-10-CM

## 2023-11-16 DIAGNOSIS — R4701 Aphasia: Secondary | ICD-10-CM

## 2023-11-16 DIAGNOSIS — D649 Anemia, unspecified: Secondary | ICD-10-CM | POA: Diagnosis not present

## 2023-11-16 DIAGNOSIS — I63331 Cerebral infarction due to thrombosis of right posterior cerebral artery: Secondary | ICD-10-CM

## 2023-11-16 DIAGNOSIS — A529 Late syphilis, unspecified: Secondary | ICD-10-CM

## 2023-11-16 DIAGNOSIS — Z7185 Encounter for immunization safety counseling: Secondary | ICD-10-CM | POA: Diagnosis not present

## 2023-11-16 HISTORY — DX: Aphasia: R47.01

## 2023-11-16 MED ORDER — BIKTARVY 50-200-25 MG PO TABS: 1.0000 | ORAL_TABLET | Freq: Every day | ORAL | 11 refills | Status: DC

## 2023-11-16 NOTE — Progress Notes (Signed)
Subjective:  Complaint follow-up for HIV disease on medications.   Patient ID: Arthur [name], male    DOB: 1956-01-18, 67 y.o.   MRN: [id number]  HPI  Discussed the use of AI scribe software for clinical note transcription with the patient, who gave verbal consent to proceed.  History of Present Illness   The patient, with a history of aphasia and HIV, presents for a follow-up visit. He reports some improvement in his speech and writing abilities, but still requires assistance with bathing and complex tasks. He is currently residing in a facility where he receives help with activities of daily living.  The patient's HIV is well-controlled on Biktarvy, with a viral load less than twenty and a healthy CD4 count of 1390. However, recent labs showed a slight anemia, the cause of which is currently unknown. He is taking iron supplements, suggesting a possible iron deficiency.  The patient is also on a regimen of other medications, including aspirin, Cymbalta, Robaxin, Crestor for cholesterol, Flomax for prostate health, and Topamax, possibly for seizures. The patient is unsure if he has received his COVID and flu vaccines.       Past Medical History:  Diagnosis Date   Acute kidney injury (HCC) 09/11/2018   AKI (acute kidney injury) (HCC) 09/11/2018   Alcohol abuse 12/21/2016   Atrial fibrillation (HCC)    Bipolar disorder (HCC)    CVA (cerebral vascular accident) (HCC) 09/11/2018   Depression    History of blood transfusion 12-12-1998   History of radiation exposure 08/17/2017   HIV (human immunodeficiency virus infection) (HCC)    HTN (hypertension) 11/17/2016   Hyperlipidemia LDL goal <70    Hypoglycemia after GI (gastrointestinal) surgery 06/20/2016   Lactic acidosis 09/11/2018   Late syphilis    Lower back pain 11/25/2015   Recurrent major depression-severe (HCC) 06/20/2016   Syncope due to orthostatic hypotension 10/04/2017   TIA (transient ischemic attack) 11/17/2016    Past Surgical  History:  Procedure Laterality Date   broken right leg Right 2000   GASTRIC BYPASS  2012   [facility]    Family History  Problem Relation Age of Onset   Heart disease Mother 33       MI age 31.  Died age 61   Stroke Mother    Heart disease Father        Died age 4s MI   Heart disease Sister        No details      Social History   Socioeconomic History   Marital status: Single    Spouse name: Not on file   Number of children: Not on file   Years of education: Not on file   Highest education level: Not on file  Occupational History   Not on file  Tobacco Use   Smoking status: Never   Smokeless tobacco: Never  Vaping Use   Vaping status: Never Used  Substance and Sexual Activity   Alcohol use: No    Comment: no EtOH in 20 days 09/26/17   Drug use: No   Sexual activity: Not Currently    Partners: Male    Comment: declined condoms  Other Topics Concern   Not on file  Social History Narrative   Retired Child psychotherapist.   Social Determinants of Health   Financial Resource Strain: Not on file  Food Insecurity: Not on file  Transportation Needs: Not on file  Physical Activity: Not on file  Stress: Not on file  Social Connections:  Not on file    No Known Allergies   Current Outpatient Medications:    aspirin 81 MG EC tablet, Take 1 tablet (81 mg total) by mouth daily., Disp: 30 tablet, Rfl: 0   acetaminophen (TYLENOL) 325 MG tablet, Take 650 mg by mouth every 6 (six) hours as needed for mild pain or fever (Every 6 hours as needed for pain or temp >100. Don't Exceed 4 grams of tylenol in 24 hours)., Disp: , Rfl:    Acetaminophen 325 MG CAPS, Take 650 mg by mouth as needed (every 4 hours as needed for pain, headache, or fever > 100F)., Disp: , Rfl:    ARIPiprazole (ABILIFY) 15 MG tablet, Take 15 mg by mouth daily., Disp: , Rfl:    aspirin-acetaminophen-caffeine (EXCEDRIN MIGRAINE) 250-250-65 MG tablet, Take 1 tablet by mouth daily as needed for headache (Don't exceed 4  grams of tylenol in 24 hours)., Disp: , Rfl:    B COMPLEX VITAMINS PO, Take 1 tablet by mouth daily., Disp: , Rfl:    bictegravir-emtricitabine-tenofovir AF (BIKTARVY) 50-200-25 MG TABS tablet, Take 1 tablet by mouth daily., Disp: 30 tablet, Rfl: 11   buPROPion (WELLBUTRIN) 75 MG tablet, Take 75 mg by mouth every morning., Disp: , Rfl:    Calcium Carbonate Antacid (MAALOX PO), Take 30 mLs by mouth as needed (Every 4 hours PRN for indigestion complaints)., Disp: , Rfl:    Cholecalciferol 50 MCG (2000 UT) TABS, Take 1 tablet by mouth daily., Disp: , Rfl:    cyanocobalamin 1000 MCG tablet, Take 1,000 mcg by mouth daily., Disp: , Rfl:    DENTA 5000 PLUS 1.1 % CREA dental cream, Take 1 Application by mouth daily., Disp: , Rfl:    DULoxetine (CYMBALTA) 20 MG capsule, Take 20 mg by mouth daily., Disp: , Rfl:    DULoxetine (CYMBALTA) 60 MG capsule, TAKE 2 CAPSULES (120 MG) BY MOUTH DAILY, Disp: 60 capsule, Rfl: 3   Ferrous Gluconate 324 (37.5 Fe) MG TABS, Take 1 tablet by mouth 3 (three) times a week. Give 1 tablet by mouth one time a day every Mon, wed, Thur for supplementation for 6 weeks give w/ OJ, Disp: , Rfl:    ferrous sulfate 325 (65 FE) MG EC tablet, Take 325 mg by mouth daily., Disp: , Rfl:    mirtazapine (REMERON) 15 MG tablet, TAKE 1 TABLET(15 MG) BY MOUTH AT BEDTIME FOR DEPRESSION OR SLEEP, Disp: 30 tablet, Rfl: 5   OLANZapine (ZYPREXA) 2.5 MG tablet, Take 2.5 mg by mouth at bedtime., Disp: , Rfl:    rivaroxaban (XARELTO) 20 MG TABS tablet, Take 1 tablet (20 mg total) by mouth daily with supper. [facility], Disp: 30 tablet, Rfl: 11   rosuvastatin (CRESTOR) 40 MG tablet, Take 1 tablet (40 mg total) by mouth daily. For high cholesterol, Disp: , Rfl:    rosuvastatin (CRESTOR) 5 MG tablet, Take 5 mg by mouth daily., Disp: , Rfl:    senna-docusate (SENOKOT-S) 8.6-50 MG tablet, Take 1 tablet by mouth 2 (two) times daily., Disp: 60 tablet, Rfl: 0   Skin Protectants, Misc. (EUCERIN) cream, Apply  topically as needed for dry skin (Apply to skin/feet/legs topically as needed for dry skin prn daily)., Disp: , Rfl:    sotalol (BETAPACE) 80 MG tablet, Take 1 tablet (80 mg total) by mouth every 12 (twelve) hours., Disp: 60 tablet, Rfl: 5   tamsulosin (FLOMAX) 0.4 MG CAPS capsule, Take 1 capsule (0.4 mg total) by mouth 2 (two) times daily. For prostate health (  Patient taking differently: Take 0.4 mg by mouth daily. Give 1 capsule by mouth one time a day every other day), Disp: 60 capsule, Rfl: 11   topiramate (TOPAMAX) 50 MG tablet, Take 100 mg by mouth at bedtime., Disp: , Rfl:    Review of Systems  Unable to perform ROS: Other       Objective:   Physical Exam Constitutional:      Appearance: He is well-developed.  HENT:     Head: Normocephalic and atraumatic.  Eyes:     Conjunctiva/sclera: Conjunctivae normal.  Cardiovascular:     Rate and Rhythm: Normal rate and regular rhythm.  Pulmonary:     Effort: Pulmonary effort is normal. No respiratory distress.     Breath sounds: No wheezing.  Abdominal:     General: There is no distension.     Palpations: Abdomen is soft.  Musculoskeletal:        General: No tenderness. Normal range of motion.     Cervical back: Normal range of motion and neck supple.  Skin:    General: Skin is warm and dry.     Coloration: Skin is not pale.     Findings: No erythema or rash.  Neurological:     Mental Status: He is alert.     Comments: Expressive aphasia  Psychiatric:        Mood and Affect: Mood normal.        Behavior: Behavior normal.           Assessment & Plan:   Assessment and Plan    HIV Well controlled on Biktarvy with viral load less than 20 and CD4 count of 1390. -Continue Biktarvy. --check HIV RNA, CD4 routine labs  Anemia Mild anemia noted on recent CBC. Patient is currently on iron supplementation. -Order iron, B12, and thyroid labs to investigate potential causes of anemia. --of not he is on iron sulfate so I  would presume IDA is working diagnosis.   NOTE HE SHOULD NOT TAKE THE IRON WITH THE BIKTARVY UNLESS HE EATS A MEAL. THEY NEED TO BE SPACED OUT FROM ONE ANOTHER IF BIKTARVY is on a n EMPYTY STOMACH  Stroke with Aphasia Improvement noted in speech and writing. No active interventions discussed for aphasia. -Continue current management and monitor for further improvement with BP managment, Asa, crestor   Hyperlipidemia: --continue crestor --check lipid panel  Vaccinations Unclear if patient has received COVID-19 and influenza vaccines. -Verify vaccination status. If not vaccinated, administer vaccines.  Follow-up in 6 months.

## 2024-05-10 NOTE — Progress Notes (Signed)
 The ASCVD Risk score (Arnett DK, et al., 2019) failed to calculate for the following reasons:   Risk score cannot be calculated because patient has a medical history suggesting prior/existing ASCVD  Arlon Bergamo, BSN, RN

## 2024-05-14 NOTE — Progress Notes (Unsigned)
 Chief complaint: follow-up for HIV disease on medications  Subjective:    Patient ID: Arthur [name], male    DOB: January 14, 1956, 68 y.o.   MRN: [id number]  HPI  Discussed the use of AI scribe software for clinical note transcription with the patient, who gave verbal consent to proceed.  History of Present Illness   Arthur [name] is a 68 year old male with HIV who presents for routine follow-up.  He is on Biktarvy  for HIV management. His last HIV viral load was less than 20, indicating it is undetectable, and his CD4 count was 1,390. His medication regimen includes duloxetine , Flomax , midodrine, olanzapine, Crestor , Senna, Topamax , and Xarelto . He is back on Crestor  for cholesterol management. He resides in a facility.      Past Medical History:  Diagnosis Date   Acute kidney injury (HCC) 09/11/2018   AKI (acute kidney injury) (HCC) 09/11/2018   Alcohol abuse 12/21/2016   Atrial fibrillation (HCC)    Bipolar disorder (HCC)    CVA (cerebral vascular accident) (HCC) 09/11/2018   Depression    Expressive aphasia 11/16/2023   History of blood transfusion 12/12/1998   History of radiation exposure 08/17/2017   HIV (human immunodeficiency virus infection) (HCC)    HTN (hypertension) 11/17/2016   Hyperlipidemia LDL goal <70    Hypoglycemia after GI (gastrointestinal) surgery 06/20/2016   Lactic acidosis 09/11/2018   Late syphilis    Lower back pain 11/25/2015   Recurrent major depression-severe (HCC) 06/20/2016   Syncope due to orthostatic hypotension 10/04/2017   TIA (transient ischemic attack) 11/17/2016    Past Surgical History:  Procedure Laterality Date   broken right leg Right 2000   GASTRIC BYPASS  2012   [facility]    Family History  Problem Relation Age of Onset   Heart disease Mother 59       MI age 48.  Died age 36   Stroke Mother    Heart disease Father        Died age 7s MI   Heart disease Sister        No details      Social History   Socioeconomic History    Marital status: Single    Spouse name: Not on file   Number of children: Not on file   Years of education: Not on file   Highest education level: Not on file  Occupational History   Not on file  Tobacco Use   Smoking status: Never   Smokeless tobacco: Never  Vaping Use   Vaping status: Never Used  Substance and Sexual Activity   Alcohol use: No    Comment: no EtOH in 20 days 09/26/17   Drug use: No   Sexual activity: Not Currently    Partners: Male    Comment: declined condoms  Other Topics Concern   Not on file  Social History Narrative   Retired Child psychotherapist.   Social Drivers of Corporate investment banker Strain: Not on file  Food Insecurity: Not on file  Transportation Needs: Not on file  Physical Activity: Not on file  Stress: Not on file  Social Connections: Not on file    No Known Allergies   Current Outpatient Medications:    acetaminophen  (TYLENOL ) 325 MG tablet, Take 650 mg by mouth every 6 (six) hours as needed for mild pain or fever (Every 6 hours as needed for pain or temp >100. Don't Exceed 4 grams of tylenol  in 24 hours)., Disp: , Rfl:  Acetaminophen  325 MG CAPS, Take 650 mg by mouth as needed (every 4 hours as needed for pain, headache, or fever > 100F)., Disp: , Rfl:    ARIPiprazole  (ABILIFY ) 15 MG tablet, Take 15 mg by mouth daily., Disp: , Rfl:    aspirin  81 MG EC tablet, Take 1 tablet (81 mg total) by mouth daily., Disp: 30 tablet, Rfl: 0   aspirin -acetaminophen -caffeine (EXCEDRIN MIGRAINE) 250-250-65 MG tablet, Take 1 tablet by mouth daily as needed for headache (Don't exceed 4 grams of tylenol  in 24 hours)., Disp: , Rfl:    B COMPLEX VITAMINS PO, Take 1 tablet by mouth daily., Disp: , Rfl:    bictegravir-emtricitabine -tenofovir  AF (BIKTARVY ) 50-200-25 MG TABS tablet, Take 1 tablet by mouth daily., Disp: 30 tablet, Rfl: 11   buPROPion  (WELLBUTRIN ) 75 MG tablet, Take 75 mg by mouth every morning., Disp: , Rfl:    Calcium  Carbonate Antacid  (MAALOX PO), Take 30 mLs by mouth as needed (Every 4 hours PRN for indigestion complaints)., Disp: , Rfl:    Cholecalciferol  50 MCG (2000 UT) TABS, Take 1 tablet by mouth daily., Disp: , Rfl:    cyanocobalamin  1000 MCG tablet, Take 1,000 mcg by mouth daily., Disp: , Rfl:    DENTA 5000 PLUS 1.1 % CREA dental cream, Take 1 Application by mouth daily., Disp: , Rfl:    DULoxetine  (CYMBALTA ) 20 MG capsule, Take 20 mg by mouth daily., Disp: , Rfl:    DULoxetine  (CYMBALTA ) 60 MG capsule, TAKE 2 CAPSULES (120 MG) BY MOUTH DAILY, Disp: 60 capsule, Rfl: 3   Ferrous Gluconate 324 (37.5 Fe) MG TABS, Take 1 tablet by mouth 3 (three) times a week. Give 1 tablet by mouth one time a day every Mon, wed, Thur for supplementation for 6 weeks give w/ OJ, Disp: , Rfl:    ferrous sulfate  325 (65 FE) MG EC tablet, Take 325 mg by mouth daily., Disp: , Rfl:    midodrine (PROAMATINE) 2.5 MG tablet, Take 2.5 mg by mouth 3 (three) times daily with meals., Disp: , Rfl:    mirtazapine  (REMERON ) 15 MG tablet, TAKE 1 TABLET(15 MG) BY MOUTH AT BEDTIME FOR DEPRESSION OR SLEEP, Disp: 30 tablet, Rfl: 5   OLANZapine (ZYPREXA) 2.5 MG tablet, Take 2.5 mg by mouth at bedtime., Disp: , Rfl:    rivaroxaban  (XARELTO ) 20 MG TABS tablet, Take 1 tablet (20 mg total) by mouth daily with supper. [facility], Disp: 30 tablet, Rfl: 11   rosuvastatin  (CRESTOR ) 5 MG tablet, Take 5 mg by mouth daily., Disp: , Rfl:    senna-docusate (SENOKOT-S) 8.6-50 MG tablet, Take 1 tablet by mouth 2 (two) times daily., Disp: 60 tablet, Rfl: 0   Skin Protectants, Misc. (EUCERIN) cream, Apply topically as needed for dry skin (Apply to skin/feet/legs topically as needed for dry skin prn daily)., Disp: , Rfl:    sotalol  (BETAPACE ) 80 MG tablet, Take 1 tablet (80 mg total) by mouth every 12 (twelve) hours., Disp: 60 tablet, Rfl: 5   tamsulosin  (FLOMAX ) 0.4 MG CAPS capsule, Take 1 capsule (0.4 mg total) by mouth 2 (two) times daily. For prostate health (Patient taking  differently: Take 0.4 mg by mouth daily. Give 1 capsule by mouth one time a day every other day), Disp: 60 capsule, Rfl: 11   topiramate  (TOPAMAX ) 50 MG tablet, Take 100 mg by mouth at bedtime., Disp: , Rfl:    Review of Systems  Unable to perform ROS: Other       Objective:  Physical Exam Constitutional:      Appearance: Normal appearance. He is well-developed. He is obese.  HENT:     Head: Normocephalic and atraumatic.  Eyes:     Conjunctiva/sclera: Conjunctivae normal.  Cardiovascular:     Rate and Rhythm: Normal rate and regular rhythm.  Pulmonary:     Effort: Pulmonary effort is normal. No respiratory distress.     Breath sounds: No wheezing.  Abdominal:     General: There is no distension.     Palpations: Abdomen is soft.  Musculoskeletal:        General: No tenderness. Normal range of motion.     Cervical back: Normal range of motion and neck supple.  Skin:    General: Skin is warm and dry.     Coloration: Skin is not pale.     Findings: No erythema or rash.  Neurological:     Mental Status: He is alert.  Psychiatric:        Mood and Affect: Mood normal.        Speech: Speech is delayed.        Behavior: Behavior normal.     Comments: Aphasia is improving           Assessment & Plan:   Assessment and Plan    HIV infection, controlled HIV infection well-controlled, viral load undetectable, CD4 count healthy. - Order  HIV RNA< CD4 CMP, RPR, GC and CHL, CBC lipids - Check and administer necessary vaccines. - Schedule follow-up in six months. --he would LOVE to visit [facility] and hopefully his facility can make this happen --Biktarvy  rx sent  Stroke with aphasia Improvement in speech noted. Continued monitoring required. continuing on crestor , asa  Depression and Biploar Managed with duloxetine , topomax  Hyperlipidemia, controlled Hyperlipidemia controlled, cholesterol levels within desired range.   Hx of gastric bypass: needs special diet       Vaccine counseling: he should have shingles vaccine if not yet received

## 2024-05-15 ENCOUNTER — Encounter: Payer: Self-pay | Admitting: Infectious Disease

## 2024-05-15 ENCOUNTER — Other Ambulatory Visit (HOSPITAL_COMMUNITY)
Admission: RE | Admit: 2024-05-15 | Discharge: 2024-05-15 | Disposition: A | Discharge status: Home / Self Care | Source: Ambulatory Visit | Attending: Infectious Disease | Admitting: Infectious Disease

## 2024-05-15 ENCOUNTER — Other Ambulatory Visit: Payer: Self-pay

## 2024-05-15 ENCOUNTER — Ambulatory Visit (INDEPENDENT_AMBULATORY_CARE_PROVIDER_SITE_OTHER): Payer: No Typology Code available for payment source | Admitting: Infectious Disease

## 2024-05-15 VITALS — BP 123/83 | HR 80 | Resp 16 | Ht 69.0 in | Wt 197.0 lb

## 2024-05-15 DIAGNOSIS — I63331 Cerebral infarction due to thrombosis of right posterior cerebral artery: Secondary | ICD-10-CM | POA: Insufficient documentation

## 2024-05-15 DIAGNOSIS — Z113 Encounter for screening for infections with a predominantly sexual mode of transmission: Secondary | ICD-10-CM

## 2024-05-15 DIAGNOSIS — Z9884 Bariatric surgery status: Secondary | ICD-10-CM

## 2024-05-15 DIAGNOSIS — E785 Hyperlipidemia, unspecified: Secondary | ICD-10-CM | POA: Diagnosis present

## 2024-05-15 DIAGNOSIS — B20 Human immunodeficiency virus [HIV] disease: Secondary | ICD-10-CM

## 2024-05-15 DIAGNOSIS — F3181 Bipolar II disorder: Secondary | ICD-10-CM | POA: Diagnosis not present

## 2024-05-15 DIAGNOSIS — R4701 Aphasia: Secondary | ICD-10-CM | POA: Diagnosis not present

## 2024-05-15 DIAGNOSIS — Z7185 Encounter for immunization safety counseling: Secondary | ICD-10-CM | POA: Diagnosis not present

## 2024-05-15 DIAGNOSIS — I10 Essential (primary) hypertension: Secondary | ICD-10-CM | POA: Diagnosis not present

## 2024-05-15 MED ORDER — BIKTARVY 50-200-25 MG PO TABS: 1.0000 | ORAL_TABLET | Freq: Every day | ORAL | 11 refills | Status: DC

## 2024-05-15 NOTE — Addendum Note (Signed)
 Addended by: Sheela Denmark N on: 05/15/2024 10:22 AM   Modules accepted: Orders

## 2024-05-15 NOTE — Patient Instructions (Addendum)
 [facility]   [address] [place], [place], [place] [address]  [phone]

## 2024-05-16 LAB — T-HELPER CELLS (CD4) COUNT (NOT AT ARMC)
CD4 % Helper T Cell: 33 % (ref 33–65)
CD4 T Cell Abs: 792 /uL (ref 400–1790)

## 2024-05-16 LAB — URINE CYTOLOGY ANCILLARY ONLY
Chlamydia: NEGATIVE
Comment: NEGATIVE
Comment: NORMAL
Neisseria Gonorrhea: NEGATIVE

## 2024-05-17 LAB — CBC WITH DIFFERENTIAL/PLATELET
Absolute Lymphocytes: 2549 {cells}/uL (ref 850–3900)
Absolute Monocytes: 360 {cells}/uL (ref 200–950)
Basophils Absolute: 38 {cells}/uL (ref 0–200)
Basophils Relative: 0.8 %
Eosinophils Absolute: 62 {cells}/uL (ref 15–500)
Eosinophils Relative: 1.3 %
HCT: 36.5 % — ABNORMAL LOW (ref 38.5–50.0)
Hemoglobin: 11 g/dL — ABNORMAL LOW (ref 13.2–17.1)
MCH: 28.4 pg (ref 27.0–33.0)
MCHC: 30.1 g/dL — ABNORMAL LOW (ref 32.0–36.0)
MCV: 94.3 fL (ref 80.0–100.0)
MPV: 10.4 fL (ref 7.5–12.5)
Monocytes Relative: 7.5 %
Neutro Abs: 1790 {cells}/uL (ref 1500–7800)
Neutrophils Relative %: 37.3 %
Platelets: 247 10*3/uL (ref 140–400)
RBC: 3.87 10*6/uL — ABNORMAL LOW (ref 4.20–5.80)
RDW: 15 % (ref 11.0–15.0)
Total Lymphocyte: 53.1 %
WBC: 4.8 10*3/uL (ref 3.8–10.8)

## 2024-05-17 LAB — COMPLETE METABOLIC PANEL WITHOUT GFR
AG Ratio: 1.5 (calc) (ref 1.0–2.5)
ALT: 8 U/L — ABNORMAL LOW (ref 9–46)
AST: 16 U/L (ref 10–35)
Albumin: 4.2 g/dL (ref 3.6–5.1)
Alkaline phosphatase (APISO): 67 U/L (ref 35–144)
BUN: 12 mg/dL (ref 7–25)
CO2: 22 mmol/L (ref 20–32)
Calcium: 9.2 mg/dL (ref 8.6–10.3)
Chloride: 109 mmol/L (ref 98–110)
Creat: 1.3 mg/dL (ref 0.70–1.35)
Globulin: 2.8 g/dL (ref 1.9–3.7)
Glucose, Bld: 112 mg/dL — ABNORMAL HIGH (ref 65–99)
Potassium: 4.3 mmol/L (ref 3.5–5.3)
Sodium: 140 mmol/L (ref 135–146)
Total Bilirubin: 0.7 mg/dL (ref 0.2–1.2)
Total Protein: 7 g/dL (ref 6.1–8.1)

## 2024-05-17 LAB — HIV-1 RNA QUANT-NO REFLEX-BLD
HIV 1 RNA Quant: NOT DETECTED {copies}/mL
HIV-1 RNA Quant, Log: NOT DETECTED {Log_copies}/mL

## 2024-05-17 LAB — RPR: RPR Ser Ql: NONREACTIVE

## 2024-10-03 LAB — LAB REPORT - SCANNED: HM HIV Screening: POSITIVE

## 2024-11-13 NOTE — Progress Notes (Deleted)
 Subjective:  Chief complaint: follow-up for HIV disease on medications   Patient ID: Arthur [name], male    DOB: [birth year], 68 y.o.   MRN: [id number]  HPI  Past Medical History:  Diagnosis Date   Acute kidney injury 09/11/2018   AKI (acute kidney injury) 09/11/2018   Alcohol abuse 12/21/2016   Atrial fibrillation (HCC)    Bipolar disorder (HCC)    CVA (cerebral vascular accident) (HCC) 09/11/2018   Depression    Expressive aphasia 11/16/2023   History of blood transfusion 12/12/1998   History of radiation exposure 08/17/2017   HIV (human immunodeficiency virus infection) (HCC)    HTN (hypertension) 11/17/2016   Hyperlipidemia LDL goal <70    Hypoglycemia after GI (gastrointestinal) surgery 06/20/2016   Lactic acidosis 09/11/2018   Late syphilis    Lower back pain 11/25/2015   Recurrent major depression-severe (HCC) 06/20/2016   Syncope due to orthostatic hypotension 10/04/2017   TIA (transient ischemic attack) 11/17/2016    Past Surgical History:  Procedure Laterality Date   broken right leg Right 2000   GASTRIC BYPASS  2012   [facility]    Family History  Problem Relation Age of Onset   Heart disease Mother 12       MI age 22.  Died age 37   Stroke Mother    Heart disease Father        Died age 63s MI   Heart disease Sister        No details      Social History   Socioeconomic History   Marital status: Single    Spouse name: Not on file   Number of children: Not on file   Years of education: Not on file   Highest education level: Not on file  Occupational History   Not on file  Tobacco Use   Smoking status: Never   Smokeless tobacco: Never  Vaping Use   Vaping status: Never Used  Substance and Sexual Activity   Alcohol use: No    Comment: no EtOH in 20 days 09/26/17   Drug use: No   Sexual activity: Not Currently    Partners: Male    Comment: declined condoms  Other Topics Concern   Not on file  Social History Narrative   Retired arts development officer.   Social Drivers of Corporate Investment Banker Strain: Not on file  Food Insecurity: Not on file  Transportation Needs: Not on file  Physical Activity: Not on file  Stress: Not on file  Social Connections: Not on file    No Known Allergies   Current Outpatient Medications:    acetaminophen  (TYLENOL ) 325 MG tablet, Take 650 mg by mouth every 6 (six) hours as needed for mild pain or fever (Every 6 hours as needed for pain or temp >100. Don't Exceed 4 grams of tylenol  in 24 hours)., Disp: , Rfl:    Acetaminophen  325 MG CAPS, Take 650 mg by mouth as needed (every 4 hours as needed for pain, headache, or fever > 100F)., Disp: , Rfl:    ARIPiprazole  (ABILIFY ) 15 MG tablet, Take 15 mg by mouth daily., Disp: , Rfl:    aspirin  81 MG EC tablet, Take 1 tablet (81 mg total) by mouth daily., Disp: 30 tablet, Rfl: 0   aspirin -acetaminophen -caffeine (EXCEDRIN MIGRAINE) 250-250-65 MG tablet, Take 1 tablet by mouth daily as needed for headache (Don't exceed 4 grams of tylenol  in 24 hours)., Disp: , Rfl:    B COMPLEX VITAMINS  PO, Take 1 tablet by mouth daily., Disp: , Rfl:    bictegravir-emtricitabine -tenofovir  AF (BIKTARVY ) 50-200-25 MG TABS tablet, Take 1 tablet by mouth daily., Disp: 30 tablet, Rfl: 11   buPROPion  (WELLBUTRIN ) 75 MG tablet, Take 75 mg by mouth every morning., Disp: , Rfl:    Calcium  Carbonate Antacid (MAALOX PO), Take 30 mLs by mouth as needed (Every 4 hours PRN for indigestion complaints)., Disp: , Rfl:    Cholecalciferol  50 MCG (2000 UT) TABS, Take 1 tablet by mouth daily., Disp: , Rfl:    cyanocobalamin  1000 MCG tablet, Take 1,000 mcg by mouth daily., Disp: , Rfl:    DENTA 5000 PLUS 1.1 % CREA dental cream, Take 1 Application by mouth daily., Disp: , Rfl:    DULoxetine  (CYMBALTA ) 20 MG capsule, Take 20 mg by mouth daily., Disp: , Rfl:    DULoxetine  (CYMBALTA ) 60 MG capsule, TAKE 2 CAPSULES (120 MG) BY MOUTH DAILY, Disp: 60 capsule, Rfl: 3   Ferrous Gluconate 324 (37.5 Fe)  MG TABS, Take 1 tablet by mouth 3 (three) times a week. Give 1 tablet by mouth one time a day every Mon, wed, Thur for supplementation for 6 weeks give w/ OJ, Disp: , Rfl:    ferrous sulfate  325 (65 FE) MG EC tablet, Take 325 mg by mouth daily., Disp: , Rfl:    midodrine (PROAMATINE) 2.5 MG tablet, Take 2.5 mg by mouth 3 (three) times daily with meals., Disp: , Rfl:    mirtazapine  (REMERON ) 15 MG tablet, TAKE 1 TABLET(15 MG) BY MOUTH AT BEDTIME FOR DEPRESSION OR SLEEP, Disp: 30 tablet, Rfl: 5   OLANZapine (ZYPREXA) 2.5 MG tablet, Take 2.5 mg by mouth at bedtime., Disp: , Rfl:    rivaroxaban  (XARELTO ) 20 MG TABS tablet, Take 1 tablet (20 mg total) by mouth daily with supper. [facility], Disp: 30 tablet, Rfl: 11   rosuvastatin  (CRESTOR ) 5 MG tablet, Take 5 mg by mouth daily., Disp: , Rfl:    senna-docusate (SENOKOT-S) 8.6-50 MG tablet, Take 1 tablet by mouth 2 (two) times daily., Disp: 60 tablet, Rfl: 0   Skin Protectants, Misc. (EUCERIN) cream, Apply topically as needed for dry skin (Apply to skin/feet/legs topically as needed for dry skin prn daily)., Disp: , Rfl:    sotalol  (BETAPACE ) 80 MG tablet, Take 1 tablet (80 mg total) by mouth every 12 (twelve) hours., Disp: 60 tablet, Rfl: 5   tamsulosin  (FLOMAX ) 0.4 MG CAPS capsule, Take 1 capsule (0.4 mg total) by mouth 2 (two) times daily. For prostate health (Patient taking differently: Take 0.4 mg by mouth daily. Give 1 capsule by mouth one time a day every other day), Disp: 60 capsule, Rfl: 11   topiramate  (TOPAMAX ) 50 MG tablet, Take 100 mg by mouth at bedtime., Disp: , Rfl:    Review of Systems     Objective:   Physical Exam        Assessment & Plan:

## 2024-11-14 ENCOUNTER — Ambulatory Visit: Payer: Self-pay | Admitting: Infectious Disease

## 2024-11-15 ENCOUNTER — Telehealth: Admitting: Infectious Disease

## 2024-11-15 DIAGNOSIS — R4701 Aphasia: Secondary | ICD-10-CM

## 2024-11-15 DIAGNOSIS — F1011 Alcohol abuse, in remission: Secondary | ICD-10-CM

## 2024-11-15 DIAGNOSIS — I63331 Cerebral infarction due to thrombosis of right posterior cerebral artery: Secondary | ICD-10-CM

## 2024-11-15 DIAGNOSIS — B20 Human immunodeficiency virus [HIV] disease: Secondary | ICD-10-CM

## 2024-11-24 NOTE — Progress Notes (Unsigned)
 Subjective:  Chief complaint: follow-up for HIV disease on medications   Patient ID: Arthur [name], male    DOB: 1956/02/06, 68 y.o.   MRN: [id number]  HPI  Past Medical History:  Diagnosis Date   Acute kidney injury 09/11/2018   AKI (acute kidney injury) 09/11/2018   Alcohol abuse 12/21/2016   Atrial fibrillation (HCC)    Bipolar disorder (HCC)    CVA (cerebral vascular accident) (HCC) 09/11/2018   Depression    Expressive aphasia 11/16/2023   History of blood transfusion 12/12/1998   History of radiation exposure 08/17/2017   HIV (human immunodeficiency virus infection) (HCC)    HTN (hypertension) 11/17/2016   Hyperlipidemia LDL goal <70    Hypoglycemia after GI (gastrointestinal) surgery 06/20/2016   Lactic acidosis 09/11/2018   Late syphilis    Lower back pain 11/25/2015   Recurrent major depression-severe (HCC) 06/20/2016   Syncope due to orthostatic hypotension 10/04/2017   TIA (transient ischemic attack) 11/17/2016    Past Surgical History:  Procedure Laterality Date   broken right leg Right 2000   GASTRIC BYPASS  2012   [facility]    Family History  Problem Relation Age of Onset   Heart disease Mother 3       MI age 5.  Died age 37   Stroke Mother    Heart disease Father        Died age 50s MI   Heart disease Sister        No details      Social History   Socioeconomic History   Marital status: Single    Spouse name: Not on file   Number of children: Not on file   Years of education: Not on file   Highest education level: Not on file  Occupational History   Not on file  Tobacco Use   Smoking status: Never   Smokeless tobacco: Never  Vaping Use   Vaping status: Never Used  Substance and Sexual Activity   Alcohol use: No    Comment: no EtOH in 20 days 09/26/17   Drug use: No   Sexual activity: Not Currently    Partners: Male    Comment: declined condoms  Other Topics Concern   Not on file  Social History Narrative   Retired arts development officer.   Social Drivers of Health   Tobacco Use: Low Risk (05/15/2024)   Patient History    Smoking Tobacco Use: Never    Smokeless Tobacco Use: Never    Passive Exposure: Not on file  Financial Resource Strain: Not on file  Food Insecurity: Not on file  Transportation Needs: Not on file  Physical Activity: Not on file  Stress: Not on file  Social Connections: Not on file  Depression (PHQ2-9): Low Risk (05/15/2024)   Depression (PHQ2-9)    PHQ-2 Score: 0  Alcohol Screen: Not on file  Housing: Not on file  Utilities: Not on file  Health Literacy: Not on file    Allergies[1]  Current Medications[2]   Review of Systems     Objective:   Physical Exam        Assessment & Plan:       [1] No Known Allergies [2]  Current Outpatient Medications:    acetaminophen  (TYLENOL ) 325 MG tablet, Take 650 mg by mouth every 6 (six) hours as needed for mild pain or fever (Every 6 hours as needed for pain or temp >100. Don't Exceed 4 grams of tylenol  in 24 hours)., Disp: ,  Rfl:    Acetaminophen  325 MG CAPS, Take 650 mg by mouth as needed (every 4 hours as needed for pain, headache, or fever > 100F)., Disp: , Rfl:    ARIPiprazole  (ABILIFY ) 15 MG tablet, Take 15 mg by mouth daily., Disp: , Rfl:    aspirin  81 MG EC tablet, Take 1 tablet (81 mg total) by mouth daily., Disp: 30 tablet, Rfl: 0   aspirin -acetaminophen -caffeine (EXCEDRIN MIGRAINE) 250-250-65 MG tablet, Take 1 tablet by mouth daily as needed for headache (Don't exceed 4 grams of tylenol  in 24 hours)., Disp: , Rfl:    B COMPLEX VITAMINS PO, Take 1 tablet by mouth daily., Disp: , Rfl:    bictegravir-emtricitabine -tenofovir  AF (BIKTARVY ) 50-200-25 MG TABS tablet, Take 1 tablet by mouth daily., Disp: 30 tablet, Rfl: 11   buPROPion  (WELLBUTRIN ) 75 MG tablet, Take 75 mg by mouth every morning., Disp: , Rfl:    Calcium  Carbonate Antacid (MAALOX PO), Take 30 mLs by mouth as needed (Every 4 hours PRN for indigestion complaints)., Disp: ,  Rfl:    Cholecalciferol  50 MCG (2000 UT) TABS, Take 1 tablet by mouth daily., Disp: , Rfl:    cyanocobalamin  1000 MCG tablet, Take 1,000 mcg by mouth daily., Disp: , Rfl:    DENTA 5000 PLUS 1.1 % CREA dental cream, Take 1 Application by mouth daily., Disp: , Rfl:    DULoxetine  (CYMBALTA ) 20 MG capsule, Take 20 mg by mouth daily., Disp: , Rfl:    DULoxetine  (CYMBALTA ) 60 MG capsule, TAKE 2 CAPSULES (120 MG) BY MOUTH DAILY, Disp: 60 capsule, Rfl: 3   Ferrous Gluconate 324 (37.5 Fe) MG TABS, Take 1 tablet by mouth 3 (three) times a week. Give 1 tablet by mouth one time a day every Mon, wed, Thur for supplementation for 6 weeks give w/ OJ, Disp: , Rfl:    ferrous sulfate  325 (65 FE) MG EC tablet, Take 325 mg by mouth daily., Disp: , Rfl:    midodrine (PROAMATINE) 2.5 MG tablet, Take 2.5 mg by mouth 3 (three) times daily with meals., Disp: , Rfl:    mirtazapine  (REMERON ) 15 MG tablet, TAKE 1 TABLET(15 MG) BY MOUTH AT BEDTIME FOR DEPRESSION OR SLEEP, Disp: 30 tablet, Rfl: 5   OLANZapine (ZYPREXA) 2.5 MG tablet, Take 2.5 mg by mouth at bedtime., Disp: , Rfl:    rivaroxaban  (XARELTO ) 20 MG TABS tablet, Take 1 tablet (20 mg total) by mouth daily with supper. [facility], Disp: 30 tablet, Rfl: 11   rosuvastatin  (CRESTOR ) 5 MG tablet, Take 5 mg by mouth daily., Disp: , Rfl:    senna-docusate (SENOKOT-S) 8.6-50 MG tablet, Take 1 tablet by mouth 2 (two) times daily., Disp: 60 tablet, Rfl: 0   Skin Protectants, Misc. (EUCERIN) cream, Apply topically as needed for dry skin (Apply to skin/feet/legs topically as needed for dry skin prn daily)., Disp: , Rfl:    sotalol  (BETAPACE ) 80 MG tablet, Take 1 tablet (80 mg total) by mouth every 12 (twelve) hours., Disp: 60 tablet, Rfl: 5   tamsulosin  (FLOMAX ) 0.4 MG CAPS capsule, Take 1 capsule (0.4 mg total) by mouth 2 (two) times daily. For prostate health (Patient taking differently: Take 0.4 mg by mouth daily. Give 1 capsule by mouth one time a day every other day), Disp:  60 capsule, Rfl: 11   topiramate  (TOPAMAX ) 50 MG tablet, Take 100 mg by mouth at bedtime., Disp: , Rfl:

## 2024-11-25 ENCOUNTER — Ambulatory Visit: Admitting: Infectious Disease

## 2024-11-25 ENCOUNTER — Other Ambulatory Visit: Payer: Self-pay

## 2024-11-25 ENCOUNTER — Other Ambulatory Visit (HOSPITAL_COMMUNITY)
Admission: RE | Admit: 2024-11-25 | Discharge: 2024-11-25 | Disposition: A | Source: Ambulatory Visit | Attending: Infectious Disease | Admitting: Infectious Disease

## 2024-11-25 ENCOUNTER — Encounter: Payer: Self-pay | Admitting: Infectious Disease

## 2024-11-25 VITALS — BP 112/79 | HR 77 | Temp 97.6°F | Ht 69.0 in | Wt 177.0 lb

## 2024-11-25 DIAGNOSIS — E785 Hyperlipidemia, unspecified: Secondary | ICD-10-CM

## 2024-11-25 DIAGNOSIS — F1010 Alcohol abuse, uncomplicated: Secondary | ICD-10-CM

## 2024-11-25 DIAGNOSIS — Z7185 Encounter for immunization safety counseling: Secondary | ICD-10-CM | POA: Diagnosis present

## 2024-11-25 DIAGNOSIS — B20 Human immunodeficiency virus [HIV] disease: Secondary | ICD-10-CM | POA: Insufficient documentation

## 2024-11-25 DIAGNOSIS — Z8673 Personal history of transient ischemic attack (TIA), and cerebral infarction without residual deficits: Secondary | ICD-10-CM | POA: Insufficient documentation

## 2024-11-25 DIAGNOSIS — Z9884 Bariatric surgery status: Secondary | ICD-10-CM

## 2024-11-25 DIAGNOSIS — I10 Essential (primary) hypertension: Secondary | ICD-10-CM

## 2024-11-25 DIAGNOSIS — R4701 Aphasia: Secondary | ICD-10-CM | POA: Insufficient documentation

## 2024-11-25 DIAGNOSIS — D649 Anemia, unspecified: Secondary | ICD-10-CM | POA: Diagnosis not present

## 2024-11-25 MED ORDER — BIKTARVY 50-200-25 MG PO TABS: 1.0000 | ORAL_TABLET | Freq: Every day | ORAL | 11 refills | Status: AC

## 2024-11-25 NOTE — Addendum Note (Signed)
 Addended by: CELESTIA LELA HERO on: 11/25/2024 01:46 PM   Modules accepted: Orders

## 2024-11-26 LAB — URINE CYTOLOGY ANCILLARY ONLY
Chlamydia: NEGATIVE
Comment: NEGATIVE
Comment: NORMAL
Neisseria Gonorrhea: NEGATIVE

## 2024-11-26 LAB — T-HELPER CELLS (CD4) COUNT (NOT AT ARMC)
CD4 % Helper T Cell: 33 % (ref 33–65)
CD4 T Cell Abs: 750 /uL (ref 400–1790)

## 2024-11-27 LAB — COMPLETE METABOLIC PANEL WITHOUT GFR
AG Ratio: 1.6 (calc) (ref 1.0–2.5)
ALT: 9 U/L (ref 9–46)
AST: 12 U/L (ref 10–35)
Albumin: 4.2 g/dL (ref 3.6–5.1)
Alkaline phosphatase (APISO): 65 U/L (ref 35–144)
BUN/Creatinine Ratio: 9 (calc) (ref 6–22)
BUN: 13 mg/dL (ref 7–25)
CO2: 22 mmol/L (ref 20–32)
Calcium: 9.3 mg/dL (ref 8.6–10.3)
Chloride: 108 mmol/L (ref 98–110)
Creat: 1.43 mg/dL — ABNORMAL HIGH (ref 0.70–1.35)
Globulin: 2.6 g/dL (ref 1.9–3.7)
Glucose, Bld: 115 mg/dL — ABNORMAL HIGH (ref 65–99)
Potassium: 4.4 mmol/L (ref 3.5–5.3)
Sodium: 138 mmol/L (ref 135–146)
Total Bilirubin: 0.6 mg/dL (ref 0.2–1.2)
Total Protein: 6.8 g/dL (ref 6.1–8.1)

## 2024-11-27 LAB — CBC WITH DIFFERENTIAL/PLATELET
Absolute Lymphocytes: 2570 {cells}/uL (ref 850–3900)
Absolute Monocytes: 418 {cells}/uL (ref 200–950)
Basophils Absolute: 31 {cells}/uL (ref 0–200)
Basophils Relative: 0.6 %
Eosinophils Absolute: 92 {cells}/uL (ref 15–500)
Eosinophils Relative: 1.8 %
HCT: 38.7 % — ABNORMAL LOW (ref 39.4–51.1)
Hemoglobin: 12 g/dL — ABNORMAL LOW (ref 13.2–17.1)
MCH: 29 pg (ref 27.0–33.0)
MCHC: 31 g/dL — ABNORMAL LOW (ref 31.6–35.4)
MCV: 93.5 fL (ref 81.4–101.7)
MPV: 10.6 fL (ref 7.5–12.5)
Monocytes Relative: 8.2 %
Neutro Abs: 1989 {cells}/uL (ref 1500–7800)
Neutrophils Relative %: 39 %
Platelets: 243 Thousand/uL (ref 140–400)
RBC: 4.14 Million/uL — ABNORMAL LOW (ref 4.20–5.80)
RDW: 14.5 % (ref 11.0–15.0)
Total Lymphocyte: 50.4 %
WBC: 5.1 Thousand/uL (ref 3.8–10.8)

## 2024-11-27 LAB — HIV-1 RNA QUANT-NO REFLEX-BLD
HIV 1 RNA Quant: NOT DETECTED {copies}/mL
HIV-1 RNA Quant, Log: NOT DETECTED {Log_copies}/mL

## 2024-11-27 LAB — SYPHILIS: RPR W/REFLEX TO RPR TITER AND TREPONEMAL ANTIBODIES, TRADITIONAL SCREENING AND DIAGNOSIS ALGORITHM: RPR Ser Ql: NONREACTIVE

## 2025-05-19 ENCOUNTER — Ambulatory Visit: Payer: Self-pay | Admitting: Infectious Disease
